# Patient Record
Sex: Female | Born: 1950 | Race: White | Hispanic: No | Marital: Married | Smoking: Current every day smoker
Health system: Southern US, Community
[De-identification: ages and names within clinical notes are randomized; demographics above are authoritative.]

## PROBLEM LIST (undated history)

## (undated) DIAGNOSIS — E785 Hyperlipidemia, unspecified: Secondary | ICD-10-CM

## (undated) DIAGNOSIS — K219 Gastro-esophageal reflux disease without esophagitis: Secondary | ICD-10-CM

## (undated) DIAGNOSIS — I10 Essential (primary) hypertension: Secondary | ICD-10-CM

## (undated) DIAGNOSIS — K635 Polyp of colon: Secondary | ICD-10-CM

## (undated) DIAGNOSIS — R011 Cardiac murmur, unspecified: Secondary | ICD-10-CM

## (undated) DIAGNOSIS — F419 Anxiety disorder, unspecified: Secondary | ICD-10-CM

## (undated) DIAGNOSIS — I2510 Atherosclerotic heart disease of native coronary artery without angina pectoris: Secondary | ICD-10-CM

## (undated) HISTORY — DX: Cardiac murmur, unspecified: R01.1

## (undated) HISTORY — DX: Atherosclerotic heart disease of native coronary artery without angina pectoris: I25.10

## (undated) HISTORY — DX: Anxiety disorder, unspecified: F41.9

## (undated) HISTORY — DX: Gastro-esophageal reflux disease without esophagitis: K21.9

## (undated) HISTORY — DX: Essential (primary) hypertension: I10

## (undated) HISTORY — DX: Polyp of colon: K63.5

## (undated) HISTORY — DX: Hyperlipidemia, unspecified: E78.5

## (undated) HISTORY — PX: TUMOR REMOVAL: SHX12

## (undated) HISTORY — PX: LUMBAR DISC SURGERY: SHX700

## (undated) HISTORY — PX: APPENDECTOMY: SHX54

---

## 1997-08-03 ENCOUNTER — Ambulatory Visit (HOSPITAL_COMMUNITY): Admission: RE | Admit: 1997-08-03 | Discharge: 1997-08-03 | Payer: Self-pay | Admitting: Family Medicine

## 1997-08-10 ENCOUNTER — Ambulatory Visit (HOSPITAL_COMMUNITY): Admission: RE | Admit: 1997-08-10 | Discharge: 1997-08-10 | Payer: Self-pay | Admitting: Family Medicine

## 1997-09-20 ENCOUNTER — Emergency Department (HOSPITAL_COMMUNITY): Admission: EM | Admit: 1997-09-20 | Discharge: 1997-09-20 | Payer: Self-pay | Admitting: Emergency Medicine

## 1998-04-20 ENCOUNTER — Ambulatory Visit (HOSPITAL_COMMUNITY): Admission: RE | Admit: 1998-04-20 | Discharge: 1998-04-20 | Payer: Self-pay | Admitting: Cardiovascular Disease

## 1998-05-30 ENCOUNTER — Encounter: Payer: Self-pay | Admitting: Gastroenterology

## 1998-05-30 ENCOUNTER — Ambulatory Visit (HOSPITAL_COMMUNITY): Admission: RE | Admit: 1998-05-30 | Discharge: 1998-05-30 | Payer: Self-pay | Admitting: Gastroenterology

## 1999-06-05 ENCOUNTER — Encounter: Payer: Self-pay | Admitting: Gastroenterology

## 1999-06-05 ENCOUNTER — Ambulatory Visit (HOSPITAL_COMMUNITY): Admission: RE | Admit: 1999-06-05 | Discharge: 1999-06-05 | Payer: Self-pay | Admitting: Gastroenterology

## 2000-01-09 ENCOUNTER — Encounter (INDEPENDENT_AMBULATORY_CARE_PROVIDER_SITE_OTHER): Payer: Self-pay | Admitting: Specialist

## 2000-01-09 ENCOUNTER — Ambulatory Visit (HOSPITAL_COMMUNITY): Admission: RE | Admit: 2000-01-09 | Discharge: 2000-01-09 | Payer: Self-pay | Admitting: Gastroenterology

## 2000-01-09 ENCOUNTER — Encounter: Payer: Self-pay | Admitting: Gastroenterology

## 2000-01-23 ENCOUNTER — Ambulatory Visit (HOSPITAL_COMMUNITY): Admission: RE | Admit: 2000-01-23 | Discharge: 2000-01-23 | Payer: Self-pay | Admitting: Cardiovascular Disease

## 2000-06-13 ENCOUNTER — Emergency Department (HOSPITAL_COMMUNITY): Admission: EM | Admit: 2000-06-13 | Discharge: 2000-06-13 | Payer: Self-pay

## 2000-12-02 ENCOUNTER — Encounter: Payer: Self-pay | Admitting: Gastroenterology

## 2000-12-02 ENCOUNTER — Ambulatory Visit (HOSPITAL_COMMUNITY): Admission: RE | Admit: 2000-12-02 | Discharge: 2000-12-02 | Payer: Self-pay | Admitting: Gastroenterology

## 2000-12-08 ENCOUNTER — Inpatient Hospital Stay (HOSPITAL_COMMUNITY): Admission: EM | Admit: 2000-12-08 | Discharge: 2000-12-10 | Payer: Self-pay | Admitting: Emergency Medicine

## 2000-12-08 ENCOUNTER — Encounter: Payer: Self-pay | Admitting: Emergency Medicine

## 2000-12-09 ENCOUNTER — Encounter: Payer: Self-pay | Admitting: Cardiology

## 2001-04-11 ENCOUNTER — Encounter: Payer: Self-pay | Admitting: Emergency Medicine

## 2001-04-11 ENCOUNTER — Emergency Department (HOSPITAL_COMMUNITY): Admission: EM | Admit: 2001-04-11 | Discharge: 2001-04-11 | Payer: Self-pay | Admitting: Emergency Medicine

## 2001-06-01 ENCOUNTER — Ambulatory Visit (HOSPITAL_COMMUNITY): Admission: RE | Admit: 2001-06-01 | Discharge: 2001-06-01 | Payer: Self-pay | Admitting: Gastroenterology

## 2001-06-01 ENCOUNTER — Encounter (INDEPENDENT_AMBULATORY_CARE_PROVIDER_SITE_OTHER): Payer: Self-pay | Admitting: Specialist

## 2001-07-20 ENCOUNTER — Encounter: Payer: Self-pay | Admitting: Urology

## 2001-07-23 ENCOUNTER — Observation Stay (HOSPITAL_COMMUNITY): Admission: RE | Admit: 2001-07-23 | Discharge: 2001-07-25 | Payer: Self-pay | Admitting: Urology

## 2001-09-03 ENCOUNTER — Encounter: Admission: RE | Admit: 2001-09-03 | Discharge: 2001-09-03 | Payer: Self-pay | Admitting: Family Medicine

## 2001-09-03 ENCOUNTER — Encounter: Payer: Self-pay | Admitting: Family Medicine

## 2001-09-16 ENCOUNTER — Emergency Department (HOSPITAL_COMMUNITY): Admission: EM | Admit: 2001-09-16 | Discharge: 2001-09-16 | Payer: Self-pay | Admitting: Emergency Medicine

## 2001-09-17 ENCOUNTER — Ambulatory Visit (HOSPITAL_COMMUNITY): Admission: RE | Admit: 2001-09-17 | Discharge: 2001-09-17 | Payer: Self-pay | Admitting: Gastroenterology

## 2001-09-17 ENCOUNTER — Encounter: Payer: Self-pay | Admitting: Gastroenterology

## 2002-01-14 ENCOUNTER — Encounter: Payer: Self-pay | Admitting: Gastroenterology

## 2002-01-14 ENCOUNTER — Ambulatory Visit (HOSPITAL_COMMUNITY): Admission: RE | Admit: 2002-01-14 | Discharge: 2002-01-14 | Payer: Self-pay | Admitting: Gastroenterology

## 2002-09-16 ENCOUNTER — Encounter: Payer: Self-pay | Admitting: Emergency Medicine

## 2002-09-16 ENCOUNTER — Emergency Department (HOSPITAL_COMMUNITY): Admission: EM | Admit: 2002-09-16 | Discharge: 2002-09-17 | Payer: Self-pay | Admitting: Emergency Medicine

## 2002-09-20 ENCOUNTER — Other Ambulatory Visit: Admission: RE | Admit: 2002-09-20 | Discharge: 2002-09-20 | Payer: Self-pay | Admitting: Obstetrics and Gynecology

## 2002-09-21 ENCOUNTER — Ambulatory Visit (HOSPITAL_COMMUNITY): Admission: RE | Admit: 2002-09-21 | Discharge: 2002-09-21 | Payer: Self-pay | Admitting: Obstetrics and Gynecology

## 2002-09-21 ENCOUNTER — Encounter: Payer: Self-pay | Admitting: Obstetrics and Gynecology

## 2002-09-22 ENCOUNTER — Ambulatory Visit: Admission: RE | Admit: 2002-09-22 | Discharge: 2002-09-22 | Payer: Self-pay | Admitting: Gynecology

## 2002-10-05 ENCOUNTER — Inpatient Hospital Stay (HOSPITAL_COMMUNITY): Admission: RE | Admit: 2002-10-05 | Discharge: 2002-10-13 | Payer: Self-pay | Admitting: Obstetrics & Gynecology

## 2002-10-05 ENCOUNTER — Encounter (INDEPENDENT_AMBULATORY_CARE_PROVIDER_SITE_OTHER): Payer: Self-pay

## 2002-10-05 ENCOUNTER — Encounter: Payer: Self-pay | Admitting: Obstetrics & Gynecology

## 2002-10-10 ENCOUNTER — Encounter: Payer: Self-pay | Admitting: Obstetrics & Gynecology

## 2002-10-13 ENCOUNTER — Encounter: Payer: Self-pay | Admitting: Obstetrics & Gynecology

## 2002-10-22 ENCOUNTER — Emergency Department (HOSPITAL_COMMUNITY): Admission: EM | Admit: 2002-10-22 | Discharge: 2002-10-22 | Payer: Self-pay | Admitting: Emergency Medicine

## 2002-10-22 ENCOUNTER — Encounter: Payer: Self-pay | Admitting: Emergency Medicine

## 2002-11-17 ENCOUNTER — Ambulatory Visit: Admission: RE | Admit: 2002-11-17 | Discharge: 2002-11-17 | Payer: Self-pay | Admitting: Gynecology

## 2003-09-25 ENCOUNTER — Emergency Department (HOSPITAL_COMMUNITY): Admission: EM | Admit: 2003-09-25 | Discharge: 2003-09-25 | Payer: Self-pay | Admitting: Emergency Medicine

## 2003-10-19 ENCOUNTER — Emergency Department (HOSPITAL_COMMUNITY): Admission: EM | Admit: 2003-10-19 | Discharge: 2003-10-20 | Payer: Self-pay | Admitting: Emergency Medicine

## 2003-10-28 ENCOUNTER — Encounter: Admission: RE | Admit: 2003-10-28 | Discharge: 2003-10-28 | Payer: Self-pay | Admitting: Family Medicine

## 2004-01-03 ENCOUNTER — Observation Stay (HOSPITAL_COMMUNITY): Admission: RE | Admit: 2004-01-03 | Discharge: 2004-01-04 | Payer: Self-pay | Admitting: Neurosurgery

## 2004-03-01 ENCOUNTER — Encounter: Admission: RE | Admit: 2004-03-01 | Discharge: 2004-03-01 | Payer: Self-pay | Admitting: Neurosurgery

## 2004-05-20 HISTORY — PX: CARDIAC CATHETERIZATION: SHX172

## 2004-06-08 ENCOUNTER — Ambulatory Visit: Payer: Self-pay | Admitting: Internal Medicine

## 2004-06-10 ENCOUNTER — Inpatient Hospital Stay (HOSPITAL_COMMUNITY): Admission: EM | Admit: 2004-06-10 | Discharge: 2004-06-13 | Payer: Self-pay | Admitting: Emergency Medicine

## 2004-06-10 ENCOUNTER — Ambulatory Visit: Payer: Self-pay | Admitting: *Deleted

## 2004-08-02 ENCOUNTER — Ambulatory Visit (HOSPITAL_COMMUNITY): Admission: RE | Admit: 2004-08-02 | Discharge: 2004-08-02 | Payer: Self-pay | Admitting: Gastroenterology

## 2004-08-06 ENCOUNTER — Encounter (INDEPENDENT_AMBULATORY_CARE_PROVIDER_SITE_OTHER): Payer: Self-pay | Admitting: *Deleted

## 2004-08-06 ENCOUNTER — Ambulatory Visit (HOSPITAL_COMMUNITY): Admission: RE | Admit: 2004-08-06 | Discharge: 2004-08-06 | Payer: Self-pay | Admitting: Gastroenterology

## 2007-07-08 ENCOUNTER — Ambulatory Visit: Payer: Self-pay | Admitting: Cardiology

## 2007-07-08 ENCOUNTER — Observation Stay (HOSPITAL_COMMUNITY): Admission: EM | Admit: 2007-07-08 | Discharge: 2007-07-08 | Payer: Self-pay | Admitting: Emergency Medicine

## 2007-07-29 ENCOUNTER — Ambulatory Visit: Payer: Self-pay

## 2007-07-29 ENCOUNTER — Ambulatory Visit: Payer: Self-pay | Admitting: Internal Medicine

## 2007-07-29 LAB — CONVERTED CEMR LAB
BUN: 11 mg/dL (ref 6–23)
CO2: 30 meq/L (ref 19–32)
Chloride: 108 meq/L (ref 96–112)
Creatinine, Ser: 0.6 mg/dL (ref 0.4–1.2)
GFR calc Af Amer: 132 mL/min
Glucose, Bld: 107 mg/dL — ABNORMAL HIGH (ref 70–99)
Potassium: 4.4 meq/L (ref 3.5–5.1)

## 2007-08-13 ENCOUNTER — Ambulatory Visit: Payer: Self-pay | Admitting: Internal Medicine

## 2008-11-09 ENCOUNTER — Telehealth: Payer: Self-pay | Admitting: Internal Medicine

## 2008-11-23 ENCOUNTER — Ambulatory Visit: Payer: Self-pay | Admitting: Internal Medicine

## 2008-11-23 ENCOUNTER — Encounter: Payer: Self-pay | Admitting: Physician Assistant

## 2008-11-23 DIAGNOSIS — R079 Chest pain, unspecified: Secondary | ICD-10-CM

## 2008-11-23 DIAGNOSIS — R0989 Other specified symptoms and signs involving the circulatory and respiratory systems: Secondary | ICD-10-CM

## 2008-11-23 DIAGNOSIS — R011 Cardiac murmur, unspecified: Secondary | ICD-10-CM

## 2008-11-23 DIAGNOSIS — I2510 Atherosclerotic heart disease of native coronary artery without angina pectoris: Secondary | ICD-10-CM | POA: Insufficient documentation

## 2008-12-12 ENCOUNTER — Ambulatory Visit (HOSPITAL_COMMUNITY): Admission: RE | Admit: 2008-12-12 | Discharge: 2008-12-12 | Payer: Self-pay | Admitting: Neurosurgery

## 2008-12-15 ENCOUNTER — Encounter: Payer: Self-pay | Admitting: Internal Medicine

## 2008-12-15 ENCOUNTER — Ambulatory Visit: Payer: Self-pay

## 2009-06-23 ENCOUNTER — Emergency Department (HOSPITAL_COMMUNITY): Admission: EM | Admit: 2009-06-23 | Discharge: 2009-06-23 | Payer: Self-pay | Admitting: Emergency Medicine

## 2009-06-28 ENCOUNTER — Ambulatory Visit: Payer: Self-pay | Admitting: Internal Medicine

## 2009-06-28 DIAGNOSIS — R599 Enlarged lymph nodes, unspecified: Secondary | ICD-10-CM | POA: Insufficient documentation

## 2009-06-28 DIAGNOSIS — J984 Other disorders of lung: Secondary | ICD-10-CM

## 2009-06-28 DIAGNOSIS — R0602 Shortness of breath: Secondary | ICD-10-CM

## 2009-06-28 DIAGNOSIS — R05 Cough: Secondary | ICD-10-CM

## 2009-06-28 DIAGNOSIS — R042 Hemoptysis: Secondary | ICD-10-CM | POA: Insufficient documentation

## 2009-07-03 ENCOUNTER — Ambulatory Visit (HOSPITAL_COMMUNITY): Admission: RE | Admit: 2009-07-03 | Discharge: 2009-07-03 | Payer: Self-pay | Admitting: Internal Medicine

## 2009-07-04 ENCOUNTER — Telehealth: Payer: Self-pay | Admitting: Internal Medicine

## 2009-07-13 ENCOUNTER — Ambulatory Visit: Payer: Self-pay | Admitting: Internal Medicine

## 2009-07-13 DIAGNOSIS — F17200 Nicotine dependence, unspecified, uncomplicated: Secondary | ICD-10-CM

## 2009-07-14 ENCOUNTER — Telehealth (INDEPENDENT_AMBULATORY_CARE_PROVIDER_SITE_OTHER): Payer: Self-pay | Admitting: *Deleted

## 2009-07-17 ENCOUNTER — Telehealth (INDEPENDENT_AMBULATORY_CARE_PROVIDER_SITE_OTHER): Payer: Self-pay | Admitting: *Deleted

## 2009-07-19 ENCOUNTER — Telehealth: Payer: Self-pay | Admitting: Internal Medicine

## 2009-07-21 ENCOUNTER — Ambulatory Visit: Payer: Self-pay | Admitting: Internal Medicine

## 2009-07-21 ENCOUNTER — Ambulatory Visit: Admission: RE | Admit: 2009-07-21 | Discharge: 2009-07-21 | Payer: Self-pay | Admitting: Internal Medicine

## 2009-07-24 ENCOUNTER — Telehealth: Payer: Self-pay | Admitting: Internal Medicine

## 2009-08-25 ENCOUNTER — Ambulatory Visit: Payer: Self-pay | Admitting: Internal Medicine

## 2009-10-26 ENCOUNTER — Ambulatory Visit: Payer: Self-pay | Admitting: Internal Medicine

## 2009-11-08 ENCOUNTER — Telehealth (INDEPENDENT_AMBULATORY_CARE_PROVIDER_SITE_OTHER): Payer: Self-pay | Admitting: *Deleted

## 2009-12-01 ENCOUNTER — Ambulatory Visit: Payer: Self-pay | Admitting: Internal Medicine

## 2009-12-17 ENCOUNTER — Encounter: Admission: RE | Admit: 2009-12-17 | Discharge: 2009-12-17 | Payer: Self-pay | Admitting: Neurosurgery

## 2010-02-26 ENCOUNTER — Telehealth (INDEPENDENT_AMBULATORY_CARE_PROVIDER_SITE_OTHER): Payer: Self-pay | Admitting: *Deleted

## 2010-03-17 ENCOUNTER — Ambulatory Visit: Payer: Self-pay | Admitting: Diagnostic Radiology

## 2010-03-17 ENCOUNTER — Emergency Department (HOSPITAL_BASED_OUTPATIENT_CLINIC_OR_DEPARTMENT_OTHER): Admission: EM | Admit: 2010-03-17 | Discharge: 2010-03-17 | Payer: Self-pay | Admitting: Emergency Medicine

## 2010-03-26 ENCOUNTER — Telehealth (INDEPENDENT_AMBULATORY_CARE_PROVIDER_SITE_OTHER): Payer: Self-pay | Admitting: *Deleted

## 2010-06-19 NOTE — Progress Notes (Signed)
Summary: results  Phone Note Call from Patient   Caller: Patient Call For: Sabien Umland Summary of Call: would like results of biopsy Initial call taken by: Rickard Patience,  July 24, 2009 8:24 AM  Follow-up for Phone Call        advised pt it would take a few days for results to be available. I also asked her how she was feeling and she staets she is feeling good. Her family memebr checked her BP this am and it is still within normal limits. I advised pt we will call her when results are available. Carron Curie CMA  July 24, 2009 9:27 AM

## 2010-06-19 NOTE — Progress Notes (Signed)
  Phone Note Refill Request Message from:  Pharmacy on [address] [facility][phone]  Refills Requested: Medication #1:  COZAAR 25 MG TABS Take 1 tablet by mouth once a day. Initial call taken by: Kandice Hams CMA,  March 26, 2010 5:26 PM    Prescriptions: COZAAR 25 MG TABS (LOSARTAN POTASSIUM) Take 1 tablet by mouth once a day  #30 x 3   Entered by:   Kandice Hams CMA   Authorized by:   Kalman Shan MD   Signed by:   Kandice Hams CMA on 03/26/2010   Method used:   Faxed to ...       [facility] (retail)       [address]       [place], [place]  [address]       Ph: [phone] or [phone]       Fax: [phone]   RxID:   [id number]

## 2010-06-19 NOTE — Assessment & Plan Note (Signed)
Summary: discuss pft results/apc   Visit Type:  Follow-up Copy to:  Er or Urgent care Primary Provider/Referring Provider:  dr Tenny Craw  cardiology, Dr Cheral Bay [facility], Dr Benedetto Goad - Silvestre Gunner PMD  CC:  Pt here to discuss PFT results and PET scan results. .  History of Present Illness: IOV 06/28/2009: 60 year old smoker wiht CAD on aspirin. c/o hemoptysis. Sudden onset on 06/22/2009. At baseline has smoker's dry cough. Prior to onset of hemoptyisis had worsening,  severe, dry cough for 1 week.  She assumed it was due to 'esophagus' (gets periodic annual stretching, dilatation by Dr. Cheral Bay). Cough was associaed with wheezing but it was dry and without fever. Then, had onset of hemoptysis during sleep at 2am. She noticed her gown splattered with blood. Thinks it was large amount based on what she saw on gown.  Went to ER. HAd CT Chest 06/23/2009. Ruled out for PE but showed multiple nodules (See result section) and borderline mediastinal and hilar nodes. Therefore, referred here. Since then no further episodoes of hemoptysis and overall cough intensity has improved significantly. Attributes improvement to cutting down on cigarettes.  Denies associated fever, chills, sputum, weight loss, change in chest pain, change in baseline orthopnea (3 pillow), parosyzmal nocturnal dyspnea, sick contact, or travel.   She presents with daughter Babette Relic and son's girl friend Irving Burton. ER notes reviewed  REC PET sCAN PFTsOV 07/13/2009: Followup hemoptysis, pulmonary nodules, and cough, and dyspnea. No furhter hemoptysis. New issue discussed is smoking. Cut down on smoking to 5 cig/day. Feeling better as a result. Cough improved. Does not want meds to quit. Had PET scan for nodules. Independently reviewed. PET scan is negative. No other new issues.   Current Medications (verified): 1)  Nitroglycerin 0.4 Mg Subl (Nitroglycerin) .... One Tablet Under Tongue Every 5 Minutes As Needed For Chest Pain---May Repeat Times  Three 2)  Aspirin Ec 325 Mg Tbec (Aspirin) .... Once Daily 3)  Lisinopril 20 Mg Tabs (Lisinopril) .... Once Daily 4)  Simvastatin 80 Mg Tabs (Simvastatin) .... Once Daily 5)  Metoprolol Tartrate 50 Mg Tabs (Metoprolol Tartrate) .... Once Daily 6)  Vicodin 5-500 Mg Tabs (Hydrocodone-Acetaminophen) .... As Needed  Allergies (verified): 1)  ! Codeine 2)  ! Morphine  Past History:  Family History: Last updated: 06/28/2009 Hx-negative for CAD In-laws: died of lung cancer and asbestos exposure.   Social History: Last updated: 06/28/2009 Disabled  Alcohol Use - no Patient is a current smoker.  Pt started in 1968 avg 2 ppd. pt states she has decreased to 1 ppd per week.  House wife Married Daughter Tammy - Futures trader at [employer], [employer]. Cell [phone]  Risk Factors: Smoking Status: current (06/28/2009)  Past Medical History: Reviewed history from 06/28/2009 and no changes required. Coronary artery disease, status post bare metal stent to the       proximal LAD in the 1990s.  Last cardiac catheterization in 2006       showed modest in-stent restenosis, approximately 20% in the       proximal LAD, 80% ostial first diagonal lesion that is stable and       not amenable to percutaneous intervention, 20% left circumflex and       20% distal RCA lesions.  Ejection fraction greater than 50% Hypertension Hyperlipidemia Glucose intolerance Colon Polyps.Marland KitchenMarland KitchenDr Bucchini > Annual scopes. All benign per hx. Last scope 2010. Bx 2006 in echar benign polyps.  Gastroesophageal reflux disease  Past Surgical History: Reviewed history from 11/18/2008 and  no changes required. Status post appendectomy Status post removal of benign ovarian tumor Lumbar disk problems, status post back surgery  Family History: Reviewed history from 06/28/2009 and no changes required. Hx-negative for CAD In-laws: died of lung cancer and asbestos exposure.   Social History: Reviewed history from 06/28/2009  and no changes required. Disabled  Alcohol Use - no Patient is a current smoker.  Pt started in 1968 avg 2 ppd. pt states she has decreased to 1 ppd per week.  House wife Married Daughter Tammy - Futures trader at [employer], [employer]. Cell [phone]  Review of Systems  The patient denies shortness of breath with activity, shortness of breath at rest, productive cough, non-productive cough, coughing up blood, chest pain, irregular heartbeats, acid heartburn, indigestion, loss of appetite, weight change, abdominal pain, difficulty swallowing, sore throat, tooth/dental problems, headaches, nasal congestion/difficulty breathing through nose, sneezing, itching, ear ache, anxiety, depression, hand/feet swelling, joint stiffness or pain, rash, change in color of mucus, and fever.    Vital Signs:  Patient profile:   60 year old female Height:      65 inches Weight:      205 pounds O2 Sat:      96 % on Room air Temp:     97.9 degrees F oral Pulse rate:   70 / minute BP sitting:   126 / 70  (right arm) Cuff size:   regular  Vitals Entered By: Carron Curie CMA (July 13, 2009 4:31 PM)  O2 Flow:  Room air  Physical Exam  General:  well developed, well nourished, in no acute distressobese.  did not cough this time. Less tobcco smell Head:  normocephalic and atraumatic Eyes:  PERRLA/EOM intact; conjunctiva and sclera clear Ears:  TMs intact and clear with normal canals Nose:  no deformity, discharge, inflammation, or lesions Mouth:  no deformity or lesions Neck:  no masses, thyromegaly, or abnormal cervical nodes Chest Wall:  no deformities noted Lungs:  decreased BS bilateral and prolonged exhilation.   Heart:  regular rate and rhythm, S1, S2 with murmur but wihtout gallops, or clicks  Abdomen:  bowel sounds positive; abdomen soft and non-tender without masses, or organomegaly Msk:  no deformity or scoliosis noted with normal posture Pulses:  pulses normal Extremities:  no  clubbing, cyanosis, edema, or deformity noted Neurologic:  CN II-XII grossly intact with normal reflexes, coordination, muscle strength and tone Skin:  intact without lesions or rashes Cervical Nodes:  no significant adenopathy Axillary Nodes:  no significant adenopathy Psych:  alert and cooperative; normal mood and affect; normal attention span and concentration   MISC. Report  Procedure date:  06/28/2009  Findings:      PFTs: indepently revieed. Mild mixed obstrn-restn on spiro, reduced DLCO. This is likely due to copd + obesity  MISC. Report  Procedure date:  07/05/2009  Findings:      PET Scan: negative  Impression & Recommendations:  Problem # 1:  LUNG NODULE (ICD-518.89) Assessment Unchanged  CT chest 06/23/2009 (done for hemoptysis) shows 4 x Rt sided nodules (3 x 1cm, 1 x 7mm) and some borderline hilar and mediastinal nodes. Nodules are generally smooth except for RUL nodule wihch is ground glass. Overall, lesions are intermediate-high probabiliyt for lung cancer. Smoker and hemoptysis and size 1cm are risk factors for cnacer. Smooth margins and multiple nodules argue against cancer. PET scan 07/05/2009 is NEGATIVE.  plan serial CT scan - next one is 3rd month in early May 2011  Orders: Radiology Referral (Radiology)  Est. Patient Level V (16109)  Problem # 2:  HEMOPTYSIS UNSPECIFIED (ICD-786.30) Assessment: Improved  No further hemoptysis. CT and PET scan negaive. Still due to age and smoking hx, needs bronc ahd will do BAL at same time from area of nodules. Will set it up for 07/21/2009. Risks of bleeding, ptx, sedation explained. She has had no issues with endoscoy in past. Hold aspirin 3-5 days before bronch  Orders: Est. Patient Level V (60454)  Problem # 3:  COUGH (ICD-786.2) Assessment: Improved  improved after quitting smoking but still +. On lsiinopril  plan change lisinopril to cozaar review symptoms at next visit  Orders: Est. Patient Level V  (09811)  Problem # 4:  TOBACCO ABUSE (ICD-305.1) Assessment: New  Today 07/13/2009 we spent 11 minutes discussing quit smoking. She understands risk of smoking. Has tried zyban in past but made her sick. On patch intermittently. Unwilling to try chantix due to bad press and cost. EXplained how to look at financial impact of quitting smoking. She feels it is in god's hands. She mentioned a woman quit smoking after writing a note to god in church. Thinks she will do the same and by herself. Has already cut down to 5 cig/day. Encouraged to stay motivated. Will monitor  Orders: Est. Patient Level V (91478) Tobacco use cessation intensive >10 minutes (29562)  Problem # 5:  SHORTNESS OF BREATH (SOB) (ICD-786.05) Assessment: Unchanged  PFts show mixed restriction obstruction wiht low dlco. This is likely from obesity and copd. This should explain her dyspnea  plan stop ace inhibitor and see what improvement we get then consider spiriva or symbicprt will ultimately need rehab  Orders: Est. Patient Level V (13086)  Medications Added to Medication List This Visit: 1)  Losartan Potassium 25 Mg Tabs (Losartan potassium) .Marland Kitchen.. 1 tablet daily instead of lsinopril  Patient Instructions: 1)  we will set you up for bronch hopefully 07/21/2009 afternoon 2)  stop aspirin 3-5 days before procedure 3)  quit smoking on own 4)  stop lisinopril - it is causing your cough 5)  take cozaar instead 6)  I will see you in 4 weeks 7)  I am not doing the inhalers because just realized that you are on this medicine that can cause cough and shortness of breath Prescriptions: LOSARTAN POTASSIUM 25 MG TABS (LOSARTAN POTASSIUM) 1 tablet daily instead of lsinopril  #30 x 3   Entered and Authorized by:   Kalman Shan MD   Signed by:   Kalman Shan MD on 07/13/2009   Method used:   Electronically to        [facility] [address] (retail)       [address]       [place], [place]  [address]       Ph:  [phone] or [phone]       Fax: [phone]   RxID:   [id number]

## 2010-06-19 NOTE — Miscellaneous (Signed)
Summary: Orders Update pft charges  Clinical Lists Changes  Orders: Added new Service order of Carbon Monoxide diffusing w/capacity (94720) - Signed Added new Service order of Lung Volumes (94240) - Signed Added new Service order of Spirometry (Pre & Post) (94060) - Signed 

## 2010-06-19 NOTE — Progress Notes (Signed)
Summary: Bronch appt-  Phone Note Call from Patient Call back at Home Phone [phone]   Caller: Patient Call For: ramaswamy Summary of Call: Patient called and would like date fopr Bronch at [facility], she staes she was told it would be on friday 07/21/09. she needs to schedule her transportattion and is therefore looking for a call from Nurse. Dena Initial call taken by: Oneita Jolly,  July 17, 2009 11:10 AM  Follow-up for Phone Call        Please advise if bronch has been scheduled for pt.  Pt needs to arrange transportation. Abigail Miyamoto RN  July 17, 2009 11:17 AM   pt called back again re: date of Bronch. Follow-up by: Eugene Gavia,  July 17, 2009 11:27 AM  Additional Follow-up for Phone Call Additional follow up Details #1::        Jen, pls schedule fro bronch. Airway exam only. Small scope. No TB risk. No fluoro Additional Follow-up by: Kalman Shan MD,  July 17, 2009 12:34 PM    Additional Follow-up for Phone Call Additional follow up Details #2::    spoke to pt again.  TD aware, and told pt would set up and call her by noon on 07/19/2009.  Pt said if not home ok to leave on her answering machine. Follow-up by: Eugene Gavia,  July 18, 2009 2:26 PM  Additional Follow-up for Phone Call Additional follow up Details #3:: Details for Additional Follow-up Action Taken: Spoke with Okey Regal at [facility] this AM around 9am and she wanted to verify that bronch was to be set up for 07/21/09 and they have blocked the appt for 8:15 but they are unable to reach the patient. I verified with Okey Regal what MR wanted per phone note. She had all of this down. She staets she will call back and verify the appt time when she reaches the pt. Carron Curie CMA  July 19, 2009 3:11 PM  I called the the bronch lab and Michelles states pt is scheudled for bronch on 07/21/09 at 8:15. Pt to arrive at 6:45. I have LMTCB to advised pt of the appt and give instructions. Carron Curie  CMA  July 19, 2009 3:13 PM  Pt advised of appt time and date. pt advisd NPO after midnight night before. advised to arrive at 6:45 and that she needs a ride home and someone to stay with her for a little while after. Pt stated understanding. Carron Curie CMA  July 19, 2009 3:20 PM

## 2010-06-19 NOTE — Assessment & Plan Note (Signed)
Summary: hemptysis, lung nodules/apc   Visit Type:  Initial Consult Copy to:  Er or Urgent care Primary Provider/Referring Provider:  dr Tenny Craw  cardiology, Dr Cheral Bay [facility]  CC:  Pulmonary Consult for abnormal CT. Pt c/o hemoptysis that happened on thursday early morning. pt states it was enough blood to saturated the front of her night gown. and her pillow. .  History of Present Illness: IOV 06/28/2009: 60 year old smoker wiht CAD on aspirin. c/o hemoptysis. Sudden onset on 06/22/2009. At baseline has smoker's dry cough. Prior to onset of hemoptyisis had worsening,  severe, dry cough for 1 week.  She assumed it was due to 'esophagus' (gets periodic annual stretching, dilatation by Dr. Cheral Bay). Cough was associaed with wheezing but it was dry and without fever. Then, had onset of hemoptysis during sleep at 2am. She noticed her gown splattered with blood. Thinks it was large amount based on what she saw on gown.  Went to ER. HAd CT Chest 06/23/2009. Ruled out for PE but showed multiple nodules (See result section) and borderline mediastinal and hilar nodes. Therefore, referred here. Since then no further episodoes of hemoptysis and overall cough intensity has improved significantly. Attributes improvement to cutting down on cigarettes.  Denies associated fever, chills, sputum, weight loss, change in chest pain, change in baseline orthopnea (3 pillow), parosyzmal nocturnal dyspnea, sick contact, or travel.   She presents with daughter Babette Relic and son's girl friend Irving Burton. ER notes reviewed  Preventive Screening-Counseling & Management  Alcohol-Tobacco     Smoking Status: current  Current Medications (verified): 1)  Nitroglycerin 0.4 Mg Subl (Nitroglycerin) .... One Tablet Under Tongue Every 5 Minutes As Needed For Chest Pain---May Repeat Times Three 2)  Aspirin Ec 325 Mg Tbec (Aspirin) .... Once Daily 3)  Lisinopril 20 Mg Tabs (Lisinopril) .... Once Daily 4)  Simvastatin 80 Mg Tabs (Simvastatin)  .... Once Daily 5)  Metoprolol Tartrate 50 Mg Tabs (Metoprolol Tartrate) .... Once Daily 6)  Vicodin 5-500 Mg Tabs (Hydrocodone-Acetaminophen) .... As Needed  Allergies (verified): 1)  ! Codeine 2)  ! Morphine  Past History:  Past Surgical History: Last updated: 11/18/2008 Status post appendectomy Status post removal of benign ovarian tumor Lumbar disk problems, status post back surgery  Family History: Last updated: 06/28/2009 Hx-negative for CAD In-laws: died of lung cancer and asbestos exposure.   Social History: Last updated: 06/28/2009 Disabled  Alcohol Use - no Patient is a current smoker.  Pt started in 1968 avg 2 ppd. pt states she has decreased to 1 ppd per week.  House wife Married Daughter Tammy - Futures trader at [employer], [employer]. Cell [phone]  Risk Factors: Smoking Status: current (06/28/2009)  Past Medical History: Coronary artery disease, status post bare metal stent to the       proximal LAD in the 1990s.  Last cardiac catheterization in 2006       showed modest in-stent restenosis, approximately 20% in the       proximal LAD, 80% ostial first diagonal lesion that is stable and       not amenable to percutaneous intervention, 20% left circumflex and       20% distal RCA lesions.  Ejection fraction greater than 50% Hypertension Hyperlipidemia Glucose intolerance Colon Polyps.Marland KitchenMarland KitchenDr Bucchini > Annual scopes. All benign per hx. Last scope 2010. Bx 2006 in echar benign polyps.  Gastroesophageal reflux disease  Family History: Hx-negative for CAD In-laws: died of lung cancer and asbestos exposure.   Social History: Disabled  Alcohol  Use - no Patient is a current smoker.  Pt started in 1968 avg 2 ppd. pt states she has decreased to 1 ppd per week.  House wife Married Daughter Tammy - Futures trader at [employer], [employer]. Cell [phone]  Review of Systems       The patient complains of shortness of breath with activity, productive cough,  coughing up blood, and chest pain.  The patient denies shortness of breath at rest, non-productive cough, irregular heartbeats, acid heartburn, indigestion, loss of appetite, weight change, abdominal pain, difficulty swallowing, sore throat, tooth/dental problems, headaches, nasal congestion/difficulty breathing through nose, sneezing, itching, ear ache, anxiety, depression, hand/feet swelling, joint stiffness or pain, rash, change in color of mucus, and fever.    Vital Signs:  Patient profile:   60 year old female Height:      65 inches Weight:      199.50 pounds O2 Sat:      95 % on Room air Temp:     98.0 degrees F oral Pulse rate:   57 / minute BP sitting:   120 / 76  (right arm) Cuff size:   regular  Vitals Entered By: Carron Curie CMA (June 28, 2009 10:58 AM)  O2 Flow:  Room air CC: Pulmonary Consult for abnormal CT. Pt c/o hemoptysis that happened on thursday early morning. pt states it was enough blood to saturated the front of her night gown. and her pillow.  Comments Medications reviewed with patient Carron Curie CMA  June 28, 2009 11:00 AM Daytime phone number verified with patient.    Physical Exam  General:  well developed, well nourished, in no acute distressobese.  coughs periodicallyu Head:  normocephalic and atraumatic Eyes:  PERRLA/EOM intact; conjunctiva and sclera clear Ears:  TMs intact and clear with normal canals Nose:  no deformity, discharge, inflammation, or lesions Mouth:  no deformity or lesions Neck:  no masses, thyromegaly, or abnormal cervical nodes Chest Wall:  no deformities noted Lungs:  decreased BS bilateral and prolonged exhilation.   Heart:  regular rate and rhythm, S1, S2 with murmur but wihtout gallops, or clicks  Abdomen:  bowel sounds positive; abdomen soft and non-tender without masses, or organomegaly Msk:  no deformity or scoliosis noted with normal posture Pulses:  pulses normal Extremities:  no clubbing, cyanosis,  edema, or deformity noted Neurologic:  CN II-XII grossly intact with normal reflexes, coordination, muscle strength and tone Skin:  intact without lesions or rashes Cervical Nodes:  no significant adenopathy Axillary Nodes:  no significant adenopathy Psych:  anxious.     CT of Chest  Procedure date:  06/23/2009  Findings:       Findings:  Pulmonary arterial opacification is excellent.  There   are no pulmonary emboli.  There is atherosclerosis of the aorta   without aneurysm or dissection.  There is mild thyroid goiter.   There is no pleural or pericardial fluid.    There is an area of ground-glass opacity measuring 10 mm in   diameter within the right upper lobe on image number 35.  There is   a 9 mm pulmonary nodule in the right middle lobe on image 58.   There is an 11 mm nodule the right lower lobe on image number 60.   There is a 7 mm nodule the right lower lobe on image number 66.  No   focal lesion is seen within the left lung.  There is a right hilar   lymph node on  image 50 one measuring 9 mm.  There is a pre carinal   lymph node measuring approximately 11 mm.  There is a left   paratracheal node on image 35 measuring 10 mm.  There is a right   paratracheal node on image number 29 measuring 12 mm.    Scans in the upper abdomen do not show a 13 mm low density at the   dome of the liver likely to be a cyst.    Review of the MIP images confirms the above findings.    IMPRESSION:   No pulmonary emboli or acute aortic pathology.    Ground-glass opacity lesion in the right upper lobe.  Two pulmonary   nodules in the right lower lobe.  One pulmonary nodule in the right   middle lobe.  Borderline hilar and mediastinal lymphadenopathy. If   the patient is at high risk of carcinoma, PET scan would be   suggested.  Otherwise, follow-up CT scan would be suggested in 3   months to assess for stability.    Read By:  Thomasenia Sales,  M.D.   Released By:  Thomasenia Sales,   M.D.  Comments:      reviuewed and agree  Impression & Recommendations:  Problem # 1:  HEMOPTYSIS UNSPECIFIED (ICD-786.30) Assessment New New one episode of hemoptysis in this smoker with undiagnosed COPD. Hemoptysis followed 1 week of worsneing cough that might hvae been mild AE-COPD. However, CT chest 06/23/2009 shows 4 x Rt sided nodules (3 x 1cm, 1 x 7mm) and some borderline hilar and mediastinal nodes. Nodules are generally smooth except for RUL nodule wihch is ground glass. Overall, lesions are intermediate-high probabiliyt for lung cancer. Smoker and hemoptysis and size 1cm are risk factors for cnacer. Smooth margins and multiple nodules argue against cancer.   plan best to proceed with workup and determine etiology Will get PET scan first to understand best bx method I will cal daughter with results - options of bronch/EBUS, and CT guided bx methods explained Orders: Pulmonary Referral (Pulmonary) New Patient Level V (04540) Radiology Referral (Radiology)  Problem # 2:  COUGH (ICD-786.2) Assessment: New  Likely copd  plan full pft  Orders: New Patient Level V (98119)  Patient Instructions: 1)  hvae pet scan 2)  will call you about result and decide if you need to come in for discussion or next biopsy procedure 3)  have full PFT 4)  call us once PFT done to review results   Immunization History:  Influenza Immunization History:    Influenza:  fluvax 3+ (02/17/2009)  Tetanus/Td Immunization History:    Tetanus/Td:  tdap (10/18/2008)   Appended Document: hemptysis, lung nodules/apc PFTs suggest likely mild copd. Pls let them know. needs to see me afer pet scan so I can discuss pet scan resuls and copd Rx with them. Pls give them appt  Appended Document: hemptysis, lung nodules/apc pt scheduled to see MR 07/10/09

## 2010-06-19 NOTE — Progress Notes (Signed)
Summary: Records Request  Faxed OV, Echo & EKG to [facility] at [facility] [phone]). Debby Freiberg  February 26, 2010 4:02 PM

## 2010-06-19 NOTE — Progress Notes (Signed)
Summary: bronch  Phone Note Call from Patient Call back at Home Phone [phone]   Caller: Patient Call For: [name] Summary of Call: Victorino Dike this pt is waiting on a call from you about her Bronch test date, she is very anxious. pt states if she is not home when you call to plz leave a message for her to call you back with a diorect extension per pt.................Marland KitchenDena Initial call taken by: Oneita Jolly,  July 19, 2009 10:43 AM  Follow-up for Phone Call        see prev phone note. pt advise. [name] CMA  July 19, 2009 3:21 PM

## 2010-06-19 NOTE — Assessment & Plan Note (Signed)
Summary: rov 4 wks ///kp   Visit Type:  Follow-up Copy to:  Er or Urgent care Primary Provider/Referring Provider:  dr Tenny Craw  cardiology, Dr Cheral Bay [facility], Dr Benedetto Goad - Silvestre Gunner PMD  CC:  Pt here for follow-up to discuss bronch results. .  History of Present Illness: IOV 06/28/2009: 60 year old smoker wiht CAD on aspirin. c/o hemoptysis. Sudden onset on 06/22/2009. At baseline has smoker's dry cough. Prior to onset of hemoptyisis had worsening,  severe, dry cough for 1 week.  She assumed it was due to 'esophagus' (gets periodic annual stretching, dilatation by Dr. Cheral Bay). Cough was associaed with wheezing but it was dry and without fever. Then, had onset of hemoptysis during sleep at 2am. She noticed her gown splattered with blood. Thinks it was large amount based on what she saw on gown.  Went to ER. HAd CT Chest 06/23/2009. Ruled out for PE but showed multiple nodules (See result section) and borderline mediastinal and hilar nodes. Therefore, referred here. Since then no further episodoes of hemoptysis and overall cough intensity has improved significantly. Attributes improvement to cutting down on cigarettes.  Denies associated fever, chills, sputum, weight loss, change in chest pain, change in baseline orthopnea (3 pillow), parosyzmal nocturnal dyspnea, sick contact, or travel.   She presents with daughter Babette Relic and son's girl friend Irving Burton. ER notes reviewed  REC PET sCAN PFTsOV 07/13/2009: Followup hemoptysis, pulmonary nodules, and cough, and dyspnea. No furhter hemoptysis. New issue discussed is smoking. Cut down on smoking to 5 cig/day. Feeling better as a result. Cough improved. Does not want meds to quit. Had PET scan for nodules. Independently reviewed. PET scan is negative. No other new issues.   Bronch 07/21/2009: Nondiagnostic exam and RML lavage  Ov 08/25/2009: Followup hemoptysis, pulmonary nodules, and cough, and dyspnea. Still withoug hemoptysus. Bronic 07/21/2009 was non  diagnositic. Cough resolved after stopping ace inhbitior and starting cozaar. Denies dyspnea. Still smoking. Refusing chantix (mader her smoke more). Refusing zyban/wellbutrin due to nausea. Will get patch OTC. Smoking half pack per day per grandaughter.   Current Medications (verified): 1)  Nitroglycerin 0.4 Mg Subl (Nitroglycerin) .... One Tablet Under Tongue Every 5 Minutes As Needed For Chest Pain---May Repeat Times Three 2)  Aspirin Ec 325 Mg Tbec (Aspirin) .... Once Daily 3)  Simvastatin 80 Mg Tabs (Simvastatin) .... Once Daily 4)  Metoprolol Tartrate 50 Mg Tabs (Metoprolol Tartrate) .... Once Daily 5)  Vicodin 5-500 Mg Tabs (Hydrocodone-Acetaminophen) .... As Needed 6)  Cozaar 25 Mg Tabs (Losartan Potassium) .... Take 1 Tablet By Mouth Once A Day  Allergies (verified): 1)  ! Codeine 2)  ! Morphine  Past History:  Family History: Last updated: 06/28/2009 Hx-negative for CAD In-laws: died of lung cancer and asbestos exposure.   Social History: Last updated: 06/28/2009 Disabled  Alcohol Use - no Patient is a current smoker.  Pt started in 1968 avg 2 ppd. pt states she has decreased to 1 ppd per week.  House wife Married Daughter Tammy - Futures trader at [employer], [employer]. Cell [phone]  Risk Factors: Smoking Status: current (06/28/2009)  Past Medical History: Reviewed history from 06/28/2009 and no changes required. Coronary artery disease, status post bare metal stent to the       proximal LAD in the 1990s.  Last cardiac catheterization in 2006       showed modest in-stent restenosis, approximately 20% in the       proximal LAD, 80% ostial first diagonal lesion that is  stable and       not amenable to percutaneous intervention, 20% left circumflex and       20% distal RCA lesions.  Ejection fraction greater than 50% Hypertension Hyperlipidemia Glucose intolerance Colon Polyps.Marland KitchenMarland KitchenDr Bucchini > Annual scopes. All benign per hx. Last scope 2010. Bx 2006 in echar  benign polyps.  Gastroesophageal reflux disease  Past Surgical History: Reviewed history from 11/18/2008 and no changes required. Status post appendectomy Status post removal of benign ovarian tumor Lumbar disk problems, status post back surgery  Family History: Reviewed history from 06/28/2009 and no changes required. Hx-negative for CAD In-laws: died of lung cancer and asbestos exposure.   Social History: Reviewed history from 06/28/2009 and no changes required. Disabled  Alcohol Use - no Patient is a current smoker.  Pt started in 1968 avg 2 ppd. pt states she has decreased to 1 ppd per week.  House wife Married Daughter Tammy - Futures trader at [employer], clinical. Cell [phone]  Review of Systems  The patient denies shortness of breath with activity, shortness of breath at rest, productive cough, non-productive cough, coughing up blood, chest pain, irregular heartbeats, acid heartburn, indigestion, loss of appetite, weight change, abdominal pain, difficulty swallowing, sore throat, tooth/dental problems, headaches, nasal congestion/difficulty breathing through nose, sneezing, itching, ear ache, anxiety, depression, hand/feet swelling, joint stiffness or pain, rash, change in color of mucus, and fever.    Vital Signs:  Patient profile:   60 year old female Height:      65 inches Weight:      200.38 pounds BMI:     33.47 O2 Sat:      96 % on Room air Temp:     97.7 degrees F oral Pulse rate:   61 / minute BP sitting:   140 / 86  (right arm) Cuff size:   regular  Vitals Entered By: Carron Curie CMA (August 25, 2009 10:17 AM)  O2 Flow:  Room air CC: Pt here for follow-up to discuss bronch results.  Comments Medications reviewed with patient Carron Curie CMA  August 25, 2009 10:20 AM Daytime phone number verified with patient.    Physical Exam  General:  well developed, well nourished, in no acute distressobese.  did not cough this time. Less tobcco  smell Head:  normocephalic and atraumatic Eyes:  PERRLA/EOM intact; conjunctiva and sclera clear Ears:  TMs intact and clear with normal canals Nose:  no deformity, discharge, inflammation, or lesions Mouth:  no deformity or lesions Neck:  no masses, thyromegaly, or abnormal cervical nodes Chest Wall:  no deformities noted Lungs:  decreased BS bilateral and prolonged exhilation.   Heart:  regular rate and rhythm, S1, S2 with murmur but wihtout gallops, or clicks  Abdomen:  bowel sounds positive; abdomen soft and non-tender without masses, or organomegaly Msk:  no deformity or scoliosis noted with normal posture Pulses:  pulses normal Extremities:  no clubbing, cyanosis, edema, or deformity noted Neurologic:  CN II-XII grossly intact with normal reflexes, coordination, muscle strength and tone Skin:  intact without lesions or rashes Cervical Nodes:  no significant adenopathy Axillary Nodes:  no significant adenopathy Psych:  alert and cooperative; normal mood and affect; normal attention span and concentration   Impression & Recommendations:  Problem # 1:  TOBACCO ABUSE (ICD-305.1) Assessment Improved STill smoking. REfusing chantix and zyban. SHe will try patch otc. BUt ref[facility]ses at [facility].   5 minute face to face counselling Orders: Radiology Referral (Radiology) Est. Patient Level III (  16109) Tobacco use cessation intermediate 3-10 minutes (99406)  Problem # 2:  SHORTNESS OF BREATH (SOB) (ICD-786.05) Assessment: Improved  PFts show mixed restriction obstruction wiht low dlco. This is likely from obesity and copd. This should explain her dyspnea. On 08/25/2009 denies dyspnea and feels this has improved after stoopping ACE inhibitor. However exam suggests she might have COPD  plan repeat spiro at followup  Orders: Est. Patient Level III (60454)  Problem # 3:  COUGH (ICD-786.2) Assessment: Improved  REsolvd after stopping ACE INHBIITOR  plan continue  cozaar  Orders: Est. Patient Level III (09811)  Problem # 4:  LUNG NODULE (ICD-518.89) Assessment: Unchanged  Orders: Radiology Referral (Radiology) Est. Patient Level III (91478)  CT chest 06/23/2009 (done for hemoptysis) shows 4 x Rt sided nodules (3 x 1cm, 1 x 7mm) and some borderline hilar and mediastinal nodes. Nodules are generally smooth except for RUL nodule wihch is ground glass. Overall, lesions are intermediate-high probabiliyt for lung cancer. Smoker and hemoptysis and size 1cm are risk factors for cnacer. Smooth margins and multiple nodules argue against cancer. PET scan 07/05/2009 is NEGATIVE. [facility] MARCH 2011 with RML LAVAGE was normal  plan serial CT scan - next one is 4th month in June 2011  Orders: Radiology Referral (Radiology)  Problem # 5:  HEMOPTYSIS UNSPECIFIED (ICD-786.30) Assessment: Improved  No further hemoptysis since Feb 2011. CT and PET scan negaive. Still due to age and smoking hx, needs bronc ahd will do BAL at same time from area of nodules. Broch 07/21/2009 with RML Lavage - normal airways and nondiganostic lavage  Medications Added to Medication List This Visit: 1)  Cozaar 25 Mg Tabs (Losartan potassium) .... Take 1 tablet by mouth once a day  Patient Instructions: 1)  have CT chest in June 2011 for nodule followup 2)  get nictoine patch from pharmacy and work on quitting smoking 3)  at followup will do spirometry 4)  return to see me after CT chest

## 2010-06-19 NOTE — Progress Notes (Signed)
Summary: results  Phone Note Call from Patient Call back at Home Phone [phone]   Caller: Patient Call For: Worth Kober Summary of Call: calling for pet scan results Initial call taken by: Rickard Patience,  July 04, 2009 4:31 PM  Follow-up for Phone Call        please advise. Carron Curie CMA  July 04, 2009 4:45 PM  pt anxiously waiting for results of PET scan.  Please call her at home number after 12:00 noon.  Follow-up by: Eugene Gavia,  July 05, 2009 8:36 AM  Additional Follow-up for Phone Call Additional follow up Details #1::        the second day she hasnt heard anything. she wants her results [phone] home. if you cant get her there then call  [phone] tammy whitehead Additional Follow-up by: Valinda Hoar,  July 06, 2009 12:42 PM    Additional Follow-up for Phone Call Additional follow up Details #2::    done. see pet scan notes. Pls ensure fu appt. I believe they come next week thruday Follow-up by: Kalman Shan MD,  July 06, 2009 3:06 PM  Additional Follow-up for Phone Call Additional follow up Details #3:: Details for Additional Follow-up Action Taken: pt scheduled to see MR on 07/13/09. pt aware.Carron Curie CMA  July 06, 2009 3:07 PM

## 2010-06-19 NOTE — Progress Notes (Signed)
Summary: Broncg date  Phone Note Call from Patient   Caller: Patient Call For: [name]/nurse Reason for Call: Lab or Test Results Summary of Call: pt is concerned about her test date for friday 07/21/09 for her Bronch she is waiting for nurse of RAmaswamy to call.in order that she may set up her ride.  [name]  July 17, 2009 11:01 AM   Initial call taken by: Oneita Jolly,  July 17, 2009 11:01 AM

## 2010-06-19 NOTE — Progress Notes (Signed)
Summary: talk to nurse  Phone Note Call from Patient Call back at [phone]   Caller: Daughter//[name] Call For: ramaswamy Summary of Call: States her mom has appt on 7/6 to discuss ct results, and wants to know if it is necessary for her to come with her, pls advise. Initial call taken by: Darletta Moll,  November 08, 2009 3:28 PM  Follow-up for Phone Call        called spoke with patient's daughter [name] and informed her that per MR's append of CT that there is overall good news, but that we prefer to bring pt's in for further review in case of questions, etc.  [name] verbalized her understanding.  also advised [name] that if she decides not to come with her mother to the appt then she can call our office for details on the ov (party release form signed by pt 06/2009).  [name] verbalized her understanding. Follow-up by: Boone Master CNA/MA,  November 08, 2009 3:38 PM

## 2010-06-19 NOTE — Assessment & Plan Note (Signed)
Summary: discuss CT results / cj   Visit Type:  Follow-up Copy to:  Er or Urgent care Primary Provider/Referring Provider:  dr Tenny Craw  cardiology, Dr Cheral Bay [facility], Dr Benedetto Goad - Silvestre Gunner PMD  CC:  follow up today--discuss ct scan results.  History of Present Illness: : FU smoking, poulmonary nodules (multiple intermed prob nodules in feb 2011), dyspnea due to obesity and milld copd. s/p cough due to ace inhibitor and s/p hemoptysis in Feb 2011 with negative bronch   OV 12/01/2009: No further hemoptysis. Denies dyspnea, cough, wheeze, chest pain, orthopnea, paroxysmal nocturnal dyspnea, fever, chills. Here to review CT results from June 2011 (4th months can). This shows continued improvement. STill smoking but is vague about how many she is smoking other than "I have cut down to few cigs per day".  Preventive Screening-Counseling & Management  Alcohol-Tobacco     Smoking Status: current     Smoking Cessation Counseling: yes     Smoke Cessation Stage: contemplative     Packs/Day: 1/2 pack per week     Year Started: 1972     Tobacco Counseling: to quit use of tobacco products  Current Medications (verified): 1)  Nitroglycerin 0.4 Mg Subl (Nitroglycerin) .... One Tablet Under Tongue Every 5 Minutes As Needed For Chest Pain---May Repeat Times Three 2)  Aspirin Ec 325 Mg Tbec (Aspirin) .... Once Daily 3)  Simvastatin 80 Mg Tabs (Simvastatin) .... Once Daily 4)  Metoprolol Tartrate 50 Mg Tabs (Metoprolol Tartrate) .... Once Daily 5)  Vicodin 5-500 Mg Tabs (Hydrocodone-Acetaminophen) .... As Needed 6)  Cozaar 25 Mg Tabs (Losartan Potassium) .... Take 1 Tablet By Mouth Once A Day  Allergies: 1)  ! Codeine 2)  ! Morphine  Comments:  Nurse/Medical Assistant: The patient's medications and allergies were reviewed with the patient and were updated in the Medication and Allergy Lists.  Past History:  Family History: Last updated: 06/28/2009 Hx-negative for CAD In-laws: died  of lung cancer and asbestos exposure.   Social History: Last updated: 06/28/2009 Disabled  Alcohol Use - no Patient is a current smoker.  Pt started in 1968 avg 2 ppd. pt states she has decreased to 1 ppd per week.  House wife Married Daughter Tammy - Futures trader at [employer], clinical. Cell [phone]  Risk Factors: Smoking Status: current (12/01/2009) Packs/Day: 1/2 pack per week (12/01/2009)  Past Medical History: Reviewed history from 06/28/2009 and no changes required. Coronary artery disease, status post bare metal stent to the       proximal LAD in the 1990s.  Last cardiac catheterization in 2006       showed modest in-stent restenosis, approximately 20% in the       proximal LAD, 80% ostial first diagonal lesion that is stable and       not amenable to percutaneous intervention, 20% left circumflex and       20% distal RCA lesions.  Ejection fraction greater than 50% Hypertension Hyperlipidemia Glucose intolerance Colon Polyps.Marland KitchenMarland KitchenDr Bucchini > Annual scopes. All benign per hx. Last scope 2010. Bx 2006 in echar benign polyps.  Gastroesophageal reflux disease  Past Surgical History: Reviewed history from 11/18/2008 and no changes required. Status post appendectomy Status post removal of benign ovarian tumor Lumbar disk problems, status post back surgery  Family History: Reviewed history from 06/28/2009 and no changes required. Hx-negative for CAD In-laws: died of lung cancer and asbestos exposure.   Social History: Reviewed history from 06/28/2009 and no changes required. Disabled  Alcohol Use -  no Patient is a current smoker.  Pt started in 1968 avg 2 ppd. pt states she has decreased to 1 ppd per week.  House wife Married Daughter Tammy - Futures trader at [employer], clinical. Cell [phone] Packs/Day:  1/2 pack per week  Review of Systems  The patient denies shortness of breath with activity, shortness of breath at rest, productive cough, non-productive  cough, coughing up blood, chest pain, irregular heartbeats, acid heartburn, indigestion, loss of appetite, weight change, abdominal pain, difficulty swallowing, sore throat, tooth/dental problems, headaches, nasal congestion/difficulty breathing through nose, sneezing, itching, ear ache, anxiety, depression, hand/feet swelling, joint stiffness or pain, rash, change in color of mucus, and fever.         pt c/o of left lower back pain   Vital Signs:  Patient profile:   60 year old female Height:      65 inches Weight:      194 pounds BMI:     32.40 O2 Sat:      96 % on Room air Temp:     97.3 degrees F oral Pulse rate:   86 / minute BP sitting:   128 / 88  (right arm) Cuff size:   regular  Vitals Entered By: Randell Loop CMA (December 01, 2009 1:26 PM)  O2 Sat at Rest %:  96 O2 Flow:  Room air CC: follow up today--discuss ct scan results Is Patient Diabetic? No Pain Assessment Patient in pain? yes      Onset of pain  right lower back pain Comments meds and allergies reviewed with pt today daytime phone number reviewed with pt Randell Loop CMA  December 01, 2009 1:35 PM    Physical Exam  General:  well developed, well nourished, in no acute distressobese.  did not cough this time. Less tobcco smell Head:  normocephalic and atraumatic Eyes:  PERRLA/EOM intact; conjunctiva and sclera clear Ears:  TMs intact and clear with normal canals Nose:  no deformity, discharge, inflammation, or lesions Mouth:  no deformity or lesions Neck:  no masses, thyromegaly, or abnormal cervical nodes Chest Wall:  no deformities noted Lungs:  decreased BS bilateral and prolonged exhilation.   Heart:  regular rate and rhythm, S1, S2 with murmur but wihtout gallops, or clicks  Abdomen:  bowel sounds positive; abdomen soft and non-tender without masses, or organomegaly Msk:  no deformity or scoliosis noted with normal posture Pulses:  pulses normal Extremities:  no clubbing, cyanosis, edema, or deformity  noted Neurologic:  CN II-XII grossly intact with normal reflexes, coordination, muscle strength and tone Skin:  intact without lesions or rashes Cervical Nodes:  no significant adenopathy Axillary Nodes:  no significant adenopathy Psych:  alert and cooperative; normal mood and affect; normal attention span and concentration   EKG  Procedure date:  10/29/2009  Findings:      Comparison: 06/23/2009   Findings: Heart is normal size. Aorta is normal caliber. Dense coronary artery calcifications are present.   The ground-glass density in the right upper lobe has improved since prior study, now difficult to measure but approximately 6 mm compared to 10 mm previously.  Right middle lobe nodule measures 8 mm compared to 9 mm previously.  The anterior right lower lobe nodule (image 35) measures 10 mm compared to 11 mm previously. Medial right lower lobe nodule measures 7 mm compared to 7 mm previously.   No new pulmonary nodules.  Mediastinal lymph nodes are less prominent with none pathologically enlarged currently.  Normal axillary lymph  nodes are noted.   Chest wall soft tissues are unremarkable. Imaging into the upper abdomen shows no acute findings.   High resolution images through the chest suggests some mild central peribronchial thickening, but no other significant interstitial lung disease.  Expiration views demonstrate mosaic perfusion throughout the lungs suggesting some areas of air trapping.   IMPRESSION: The right upper lobe ground-glass nodule has improved since prior study.  Solid right middle and right lower lobe nodules are stable. Recommend continued surveillance with next study in 6-12 months.   Mild central peribronchial thickening with areas of air trapping on expiration imaging.   Read By:  Charlett Nose,  M.D.     Released By:  Charlett Nose,  M.D.   Comments:      independently reviewed  Impression & Recommendations:  Problem # 1:  TOBACCO ABUSE  (ICD-305.1) Assessment Unchanged  counselled  STill smoking. Again REfusing chantix and zyban. Not trying patch either Again refusd classses at [facility]. She will quit on own. Advised her to set stop date  5 minute face to face counselling Orders: Radiology Referral (Radiology)  Orders: Est. Patient Level III (16109) Tobacco use cessation intermediate 3-10 minutes (60454)  Problem # 2:  SHORTNESS OF BREATH (SOB) (ICD-786.05) Assessment: Improved  resovled  Orders: Est. Patient Level III (09811)  Problem # 3:  LUNG NODULE (ICD-518.89) Assessment: Improved  Orders: Radiology Referral (Radiology) Est. Patient Level III (91478)  Orders: Radiology Referral (Radiology)  CT chest 06/23/2009 (done for hemoptysis) shows 4 x Rt sided nodules (3 x 1cm, 1 x 7mm) and some borderline hilar and mediastinal nodes. Nodules are generally smooth except for RUL nodule wihch is ground glass. Overall, lesions are intermediate-high probabiliyt for lung cancer. Smoker and hemoptysis and size 1cm are risk factors for cnacer. Smooth margins and multiple nodules argue against cancer. PET scan 07/05/2009 is NEGATIVE. [facility] MARCH 2011 with RML LAVAGE was normal  > CT scan6/03/2010: Improved  PLAN NExt scan in 9-10 months  Problem # 4:  HEMOPTYSIS UNSPECIFIED (ICD-786.30) Assessment: Comment Only  No further hemoptysis since Feb 2011. CT and PET scan negaive. S Broch 07/21/2009 with RML Lavage - normal airways and nondiganostic lavage.   plan expectant approach  Orders: Est. Patient Level III (29562)  Problem # 5:  ENLARGEMENT OF LYMPH NODES (ICD-785.6)  Orders: Est. Patient Level III (13086)  Patient Instructions: 1)  please have CT scan in 10 months and return for followup 2)  please quit smoking 3)  return sooner ifany new problems

## 2010-06-19 NOTE — Progress Notes (Signed)
Summary: returned call   Phone Note Call from Patient Call back at Home Phone [phone]   Caller: Patient Call For: ramaswamy Summary of Call: pt returned call. (says someone called moments ago- didn't know who).  Initial call taken by: Jasmine Ringer, CNA,  July 14, 2009 12:37 PM  Follow-up for Phone Call        Did you call pt [name]? Please advise thanks [name]  July 14, 2009 3:23 PM  no it was [facility].[name] CMA  July 14, 2009 3:27 PM   Additional Follow-up for Phone Call Additional follow up Details #1::        left pt a message to call us back on monday 07/17/09 Additional Follow-up by: [name],  July 14, 2009 4:59 PM

## 2010-08-01 LAB — DIFFERENTIAL
Basophils Absolute: 0.1 10*3/uL (ref 0.0–0.1)
Eosinophils Absolute: 0 10*3/uL (ref 0.0–0.7)
Eosinophils Relative: 1 % (ref 0–5)
Lymphocytes Relative: 31 % (ref 12–46)
Lymphs Abs: 2.4 10*3/uL (ref 0.7–4.0)
Monocytes Absolute: 1.2 10*3/uL — ABNORMAL HIGH (ref 0.1–1.0)
Monocytes Relative: 15 % — ABNORMAL HIGH (ref 3–12)
Neutro Abs: 4.2 10*3/uL (ref 1.7–7.7)
Neutrophils Relative %: 52 % (ref 43–77)

## 2010-08-01 LAB — POCT CARDIAC MARKERS
CKMB, poc: <1 ng/mL — ABNORMAL LOW (ref 1.0–8.0)
Myoglobin, poc: 24.4 ng/mL (ref 12–200)
Troponin i, poc: <0.05 ng/mL (ref 0.00–0.09)

## 2010-08-01 LAB — CBC
HCT: 43.5 % (ref 36.0–46.0)
MCV: 90.8 fL (ref 78.0–100.0)
Platelets: 172 10*3/uL (ref 150–400)
RDW: 13.4 % (ref 11.5–15.5)
WBC: 7.9 10*3/uL (ref 4.0–10.5)

## 2010-08-01 LAB — BASIC METABOLIC PANEL
Calcium: 8.7 mg/dL (ref 8.4–10.5)
Creatinine, Ser: 0.6 mg/dL (ref 0.4–1.2)
GFR calc Af Amer: >60 mL/min (ref >60)

## 2010-08-08 LAB — CBC
HCT: 44.4 % (ref 36.0–46.0)
Hemoglobin: 15.2 g/dL — ABNORMAL HIGH (ref 12.0–15.0)
Platelets: 224 10*3/uL (ref 150–400)
RBC: 4.71 MIL/uL (ref 3.87–5.11)
WBC: 10.3 10*3/uL (ref 4.0–10.5)

## 2010-08-08 LAB — DIFFERENTIAL
Basophils Relative: 0 % (ref 0–1)
Eosinophils Absolute: 0.2 10*3/uL (ref 0.0–0.7)
Lymphocytes Relative: 30 % (ref 12–46)
Lymphs Abs: 3.1 10*3/uL (ref 0.7–4.0)
Monocytes Absolute: 1.1 10*3/uL — ABNORMAL HIGH (ref 0.1–1.0)
Monocytes Relative: 10 % (ref 3–12)
Neutrophils Relative %: 57 % (ref 43–77)

## 2010-08-08 LAB — BASIC METABOLIC PANEL
Chloride: 106 mEq/L (ref 96–112)
Creatinine, Ser: 0.49 mg/dL (ref 0.4–1.2)
GFR calc non Af Amer: >60 mL/min (ref >60)
Sodium: 139 mEq/L (ref 135–145)

## 2010-08-10 LAB — GLUCOSE, CAPILLARY: Glucose-Capillary: 136 mg/dL — ABNORMAL HIGH (ref 70–99)

## 2010-08-13 LAB — LEGIONELLA PROFILE(CULTURE+DFA/SMEAR): Special Requests: ABNORMAL

## 2010-08-13 LAB — BODY FLUID CELL COUNT WITH DIFFERENTIAL
Eos, Fluid: 1 %
Monocyte-Macrophage-Serous Fluid: 21 % — ABNORMAL LOW (ref 50–90)
Neutrophil Count, Fluid: 70 % — ABNORMAL HIGH (ref 0–25)
Total Nucleated Cell Count, Fluid: 320 cu mm (ref 0–1000)

## 2010-08-13 LAB — CULTURE, RESPIRATORY W GRAM STAIN

## 2010-08-13 LAB — FUNGUS CULTURE W SMEAR
Fungal Smear: NONE SEEN
Special Requests: ABNORMAL

## 2010-08-13 LAB — AFB CULTURE WITH SMEAR (NOT AT ARMC): Special Requests: ABNORMAL

## 2010-08-26 LAB — CREATININE, SERUM
Creatinine, Ser: 0.65 mg/dL (ref 0.4–1.2)
GFR calc non Af Amer: >60 mL/min (ref >60)

## 2010-09-05 ENCOUNTER — Other Ambulatory Visit: Payer: Self-pay | Admitting: Internal Medicine

## 2010-09-05 DIAGNOSIS — J984 Other disorders of lung: Secondary | ICD-10-CM

## 2010-09-05 DIAGNOSIS — R911 Solitary pulmonary nodule: Secondary | ICD-10-CM

## 2010-10-02 NOTE — Assessment & Plan Note (Signed)
[facility]                            CARDIOLOGY OFFICE NOTE   [name], [name]                   MRN:          [id number]  DATE:08/13/2007                            DOB:          1950-09-18    IDENTIFICATION:  [name] is a 60 year old woman who I saw remotely  in January 2006.  She has a history of CAD with an 80% diagonal which is  a smaller vessel, some calcification of the LAD and circumflex.   Since seen, she was actually admitted to [facility] with chest pain  in February.  Refer to the admission for full details.  She underwent a  Myoview scan that showed normal perfusion.  Note, her blood pressure was  high when she went into the hospital.  Question if this contributed to  some of her symptoms.  Since being discharged, she has not had any chest  pain.  She seems to be feeling okay.   CURRENT MEDICATIONS:  1. Aspirin 325.  2. Lisinopril 20.  3. Simvastatin 80.  4. Metoprolol 50.   PHYSICAL EXAMINATION:  GENERAL:  The patient is in no distress.  VITAL SIGNS:  Blood pressure is 120/60, pulse is 56 and regular, weight  213 which is down from 222 in January 2006.  LUNGS:  Clear.  CARDIAC:  Regular rate and rhythm, S1-S2, no murmurs.  ABDOMEN:  Benign.  EXTREMITIES:  No edema.   LABORATORY DATA:  At the hospital:  Total cholesterol 123, HDL 22, LDL  68, triglycerides of 401.   DIAGNOSTICS:  A 12-lead EKG:  Sinus rhythm, 62 beats per minute,  relatively low voltage.   IMPRESSION:  1. Coronary artery disease, small diagonal with disease by cath a few      years ago.  Recent Myoview scan shows normal perfusion.  Her chest pain does not  appear to be related to ischemia.  She is asymptomatic now.  Would  manage risk factors.  1. Dyslipidemia.  We will need to increase treating this.  As her HDL      is low, we will add Niaspan to her regimen and follow in 10-12      weeks' time.  2. Hypertension, good control.   FOLLOW UP:  I will set followup for the winter, but will be in touch  with her regarding her blood work.     Pricilla Riffle, MD, [facility]  Electronically Signed    PVR/MedQ  DD: 08/13/2007  DT: 08/14/2007  Job #: [id number]

## 2010-10-02 NOTE — Discharge Summary (Signed)
[name], [name]            ACCOUNT NO.:  [id number]   MEDICAL RECORD NO.:  [id number]          PATIENT TYPE:  INP   LOCATION:  6524                         FACILITY:  [facility]   PHYSICIAN:  Madolyn Frieze. Jens Som, MD, FACCDATE OF BIRTH:  07-24-1950   DATE OF ADMISSION:  07/07/2007  DATE OF DISCHARGE:  07/08/2007                               DISCHARGE SUMMARY   PRIMARY CARDIOLOGIST:  Pricilla Riffle, MD, [facility]   PRIMARY CARE PHYSICIAN:  Dr. Andrey Campanile.   PROCEDURES PERFORMED DURING HOSPITALIZATION:  None.   FINAL DISCHARGE DIAGNOSES:  1. Hypertension.  2. Chest pain.  3. Hypercholesterolemia.  4. History of coronary artery disease.      a.     Status post bare metal stent to the left anterior descending       with cath in January of 2006 revealing 20% in-stent stenosis, 80%       ostial diagonal I small, 20% left circumflex and 20% distal right       coronary.  5. Gastroesophageal reflux disease.  6. Glucose intolerance.   HOSPITAL COURSE:  This 60 year old female with a history of known  coronary artery disease started having a weird feeling in her chest  like electricity around 5:30 p.m., and soon followed by left arm pain  that lasted approximately 90 minutes.  She checked her blood pressure at  home.  It was reportedly greater than 200.  She called EMS, and her  blood pressure was found to be elevated at 198/70 on their check.  The  patient did not take any nitroglycerin, her blood pressure came down on  its own, and the arm and chest pain resolved on their own.  Secondary to  symptoms, the patient was seen in the emergency room.   The patient was admitted for rule out myocardial infarction in the  setting of known coronary artery disease with marked hypertension.  The  patient's lisinopril was increased to 20 mg from 10 mg p.o. daily.  Her  cardiac enzymes were found to be negative.  Chest pain resolved without  any further symptoms.  The patient was seen and examined by Dr.  Olga Millers on July 08, 2007 and found to be stable for discharge with  a blood pressure of 131/48.  The patient will have a follow up BMET and  stress Myoview and then follow up with Dr. Dietrich Pates.  Her lisinopril  was increased to 20 mg from 10 mg, with a BMET to be completed within 1  week.   DISCHARGE LABORATORIES:  Sodium 141, potassium 4.4, chloride 106, CO2 of  28, BUN 12, creatinine 0.6, glucose 109.  Hemoglobin 15.3, hematocrit  45, white blood cells 11.0, platelets 228.  TSH 4.171, troponin 0.01 and  0.02 respectively.  Cholesterol 123, triglycerides 163, HDL of 22, LDL  of 68.  D-dimer less than 0.22.  BNP 50.0.  EKG revealing sinus  bradycardia, ventricular rate of 54 beats per minute with nonspecific  anterior T wave abnormality.   DISCHARGE MEDICATIONS:  1. Lisinopril 20 mg daily (increased dose from 10 mg daily).  2. Toprol-XL 50  mg daily.  3. Zocor 80 mg daily.  4. Valium 10 mg twice a day p.r.n.  5. Lortab 5/325 mg every 6 hours as needed.  6. Nitroglycerin 0.4 mg p.r.n.   ALLERGIES:  1. CODEINE.  2. MORPHINE.   FOLLOW UP PLANS AND APPOINTMENTS:  1. The patient is scheduled for a stress Myoview on July 20, 2007 at 9      a.m.  2. The patient will have a BMET drawn at the same time to evaluate      renal function with increased dose of lisinopril.  3. The patient will follow with Dr. Dietrich Pates on August 13, 2007 at      2.45 for continued cardiac management and evaluation of blood      pressure.  4. The patient is to follow up with her primary care physician, Dr.      Andrey Campanile, for continued medical management.   Time spent with the patient to include physician time 30 minutes.      Bettey Mare. Lyman Bishop, NP      Madolyn Frieze. Jens Som, MD, [facility]  Electronically Signed    KML/MEDQ  D:  07/08/2007  T:  07/09/2007  Job:  [id number]   cc:   Dr. Andrey Campanile

## 2010-10-02 NOTE — H&P (Signed)
NAMEMARRIAH, SANDERLIN NO.:  [id number]   MEDICAL RECORD NO.:  [id number]          PATIENT TYPE:  EMS   LOCATION:  MAJO                         FACILITY:  [facility]   PHYSICIAN:  Lowella Bandy, MD      DATE OF BIRTH:  09-Aug-1950   DATE OF ADMISSION:  07/07/2007  DATE OF DISCHARGE:                              HISTORY & PHYSICAL   CHIEF COMPLAINT:  Chest pain.   HISTORY OF PRESENT ILLNESS:  [name] is a 60 year old female with  a history of coronary artery disease and previous stenting of the LAD  who started having weird feelings n her chest, like electricity  shooting through my chest  at approximately 5:30 p.m.  This was soon  followed by pain in her left shoulder and left arm. She reports that  these symptoms lasted over 90 minutes and she checked her blood pressure  at home with a monitor and the systolic blood pressure was reportedly  greater than 200.  She did not take a nitroglycerin at that time.  Her  family called EMS and on EMS arrival, her blood pressure was 198/70.  Her symptoms gradually resolved on their own. She was given 325 mg of  aspirin by EMS and brought to the [facility]  Emergency  Department.  She has remained chest pain free since her arrival here.  She denies any recent history of chest pain or other exertional  symptoms.  She had a recent febrile illness several days ago with fever,  runny nose, and myalgias but this has resolved.   PAST MEDICAL HISTORY:  1. Coronary artery disease, status post bare metal stent to the      proximal LAD in the 1990s.  Last cardiac catheterization in 2006      showed modest in-stent restenosis, approximately 20% in the      proximal LAD, 80% ostial first diagonal lesion that is stable and      not amenable to percutaneous intervention, 20% left circumflex and      20% distal RCA lesions.  Ejection fraction greater than 50%.  2. Hypertension.  3. Hyperlipidemia.  4. Glucose intolerance.  5.  Lumbar disk problems, status post back surgery.  6. Gastroesophageal reflux disease.  7. Esophageal ring.  8. Colon polyps.  9. Status post appendectomy.  10.Status post removal of benign ovarian tumor.   MEDICATIONS:  1. Enteric-coated aspirin 325 mg p.o. once a day.  2. Toprol-XL 50 mg p.o. once a day.  3. Zocor 80 mg p.o. once a day.  4. Lisinopril 10 mg p.o. once a day.  5. Nitroglycerin p.r.n.  6. Valium 5 mg b.i.d. p.r.n.  7. Vicodin 5 mg p.r.n.   ALLERGIES:  CODEINE and MORPHINE.   SOCIAL HISTORY:  The patient is a heavy smoker and continues to smoke  over one pack per day with over a 50-pack-year smoking history.  She  denies alcohol use.  She is on disability.   FAMILY HISTORY:  Negative for coronary artery disease.   REVIEW OF SYSTEMS:  Positive for recent episodes of fevers, runny nose,  myalgias, which have resolved. Also positive for back pain.  Otherwise  10 systems reviewed, otherwise negative other than as noted in the HPI.   PHYSICAL EXAMINATION:  VITAL SIGNS:  Blood pressure 131/48; on repeat  right arm 161/71, left arm 162/44, temperature 97.3, pulse 53, oxygen  saturation 97% on room air.  GENERAL:  The patient breathing comfortably and in no apparent distress.  HEENT:  Normocephalic, atraumatic.  Extraocular movements intact.  Sclerae nonicteric.  NECK:  Supple.  No masses appreciated.  No carotid bruits or JVD.  CARDIOVASCULAR:  Regular rate and rhythm.  Normal S1 and S2.  Soft, 2/6  holosystolic murmur at the apex.  CHEST:  Poor air movement bilaterally.  No crackles or wheezes  appreciated.  ABDOMEN:  Soft, nontender, nondistended.  Normal bowel sounds.  EXTREMITIES:  Warm, no edema.  MUSCULOSKELETAL: No joint effusions or erythema.  SKIN:  No rashes.  NEUROLOGIC:  Alert and oriented x3.  Cranial nerves grossly intact.  Moving all extremities well.  PSYCHIATRY:  Alert and oriented x3.  Affect pleasant and appropriate.   LABORATORY DATA:  Chest  x-ray shows mild cardiomegaly which is stable,  no infiltrates or edema.  EKG reviewed shows sinus bradycardia at 54  beats per minute with nonspecific T wave abnormalities.  Unchanged from  previous.  White blood cell count 11, with normal differential.  Hemoglobin 15.3, hematocrit 45, platelets 28.  Sodium 141, potassium  4.4, chloride 106, bicarb 28, BUN 12, creatinine 0.6, glucose 109.  Troponin-I was 0.05. CK-MB less than 1.   ASSESSMENT:  A 60 year old female with coronary artery disease  presenting with chest and arm pain in the setting of marked  hypertension.  Her symptoms are largely atypical for myocardial ischemia  and her first set of cardiac biomarkers are negative and she has no  significant EKG changes.  Of note, her blood pressure did resolve on its  own without medical therapy.  Her chest x-ray does not show a widened  mediastinum and her blood pressure is equal in both arms, making  dissection appear unlikely.   PLAN:  1. We will admit to Dr. Dietrich Pates to rule out myocardial infarction.      We will also check a D-dimer although a pulmonary embolism seems      unlikely.  2. We will continue her aspirin.  We will  not start therapeutic      heparin at this time unless she has elevated biomarkers or EKG      changes.  3. We will continue her home regimen for hypertension and assess her      response.  She reports compliance with her medications, and if so,      she may need increasing of her lisinopril.  4. If she rules out for myocardial infarction and other evaluations      are unremarkable, she may be a candidate for discharge and an      outpatient stress test.     Lowella Bandy, MD  Electronically Signed    JJC/MEDQ  D:  07/08/2007  T:  07/08/2007  Job:  [id number]

## 2010-10-03 ENCOUNTER — Ambulatory Visit (INDEPENDENT_AMBULATORY_CARE_PROVIDER_SITE_OTHER)
Admission: RE | Admit: 2010-10-03 | Discharge: 2010-10-03 | Disposition: A | Discharge status: Home / Self Care | Payer: Medicare Other | Source: Ambulatory Visit | Attending: Internal Medicine | Admitting: Internal Medicine

## 2010-10-03 DIAGNOSIS — J984 Other disorders of lung: Secondary | ICD-10-CM

## 2010-10-04 NOTE — Progress Notes (Signed)
Quick Note:  Spoke with pt and notified of results per Dr. Wert. Pt verbalized understanding and denied any questions.  ______ 

## 2010-10-05 NOTE — H&P (Signed)
NAMEYOSHINO, BROCCOLI            ACCOUNT NO.:  [id number]   MEDICAL RECORD NO.:  [id number]          PATIENT TYPE:  INP   LOCATION:  2014                         FACILITY:  [facility]   PHYSICIAN:  Creta Levin, M.D. LHCDATE OF BIRTH:  [birth year]   DATE OF ADMISSION:  06/10/2004  DATE OF DISCHARGE:                                HISTORY & PHYSICAL   PRIMARY CARE PHYSICIAN:  Dr. Andrey Campanile.   CARDIOLOGIST:  Dr. Dietrich Pates.   CHIEF COMPLAINT:  Chest pain and left upper extremity pain.   HISTORY:  Ms. Harkless is a 60 year old woman with coronary artery disease  who is in her usual state of health until this evening when she developed  mid sternal chest discomfort that was typical of her cardiac chest pain.  This pain responded to sublingual nitroglycerin but lasted about 20 minutes.  About an hour later, she had some left upper extremity pain as well as some  fullness in the left neck. This also responded to nitroglycerin. She then  called me, and I advised her to come to the emergency room. There, she was  initially without complaints. She denied any dyspnea, diaphoresis, nausea,  orthopnea, PND, edema, palpitations, presyncope, or syncope.   PAST MEDICAL HISTORY:  1.  Coronary artery disease.      1.  PCI of a proximal LAD.      2.  Most recent catheter in September 2001 revealing the following:          Left main minor irregularities; LAD proximal stent with minimal in-          stent restenosis, calcification; D1 95% (jailed), small vessel; left          circumflex 25% proximal; RCA minor irregularities and possible          collaterals from the PDA to the diagonal distribution.   ALLERGIES:  CODEINE and MORPHINE cause nausea.   MEDICATIONS:  1.  Aspirin 81 mg q.d.  2.  Norvasc 5 mg q.d.  3.  Toprol 25 mg q.d.  4.  Zocor 40  mg q.d.   SOCIAL HISTORY:  She lives with [place] with her husband. She is  unemployed and disabled. She formally worked in [employer]. She  continues to  smoke at least a pack per day. She does not drink a significant amount of  alcohol. She denies illicit drugs or herbal medication use.   FAMILY HISTORY:  Her mother had a stroke and father had prostate cancer. She  is not aware of any coronary disease in her siblings.   REVIEW OF SYSTEMS:  Positive for the chest discomfort as well as back pain.  She also reports some possible glucose intolerance. The remainder of her  complete 10-point review of systems was negative except for that mentioned  above.   PHYSICAL EXAMINATION:  VITAL SIGNS:  Temperature 97.9, blood pressure  123/59, heart rate 69, respiratory rate 22, saturating 99% on room air.  GENERAL:  She is a hardened, middle-aged woman in no acute distress.  HEENT:  Unremarkable except for poor dentition.  NECK:  JVP appeared normal, carotid  upstroke normal, I do not feel there  were any bruits but probably just radiated heart sounds.  LUNGS:  Clear.  CARDIOVASCULAR:  Regular rate and rhythm with a normal S1 and normal S2, no  gallop, there was a 2/6 mid systolic murmur heard best at the right upper  sternal border and radiating to the neck.  ABDOMEN:  Benign.  RECTAL:  Heme-negative stool.  EXTREMITIES:  She did have bilateral femoral bruits; no clubbing, cyanosis,  or edema.  NEUROLOGICAL:  Nonfocal.   STUDIES:  Chest x-ray showed no air space disease. ECG:  Normal sinus rhythm  at 66 with nonspecific ST-T wave changes. Labs:  The white count was 11.6,  hematocrit 39.7, platelets 285. Chem 7:  Sodium 137, potassium 3.5, chloride  110, bicarb 25, BUN 13, creatinine 0.6, glucose 112. A GI panel from January  20 was normal. PTT was 30, PT 12.5 with an INR of 0.9. Lipids from January  20 revealed a total cholesterol of 173, triglycerides 190, HDL 23.8, LDL  111.   ASSESSMENT/PLAN:  1.  Unstable angina. She will be treated with aspirin, metoprolol,      Captopril, and Lipitor 80 mg. If she develops positive  biomarkers or has      ST segment deviation, then 2B3A inhibitor can be considered. She did      develop some chest discomfort after my evaluation, so she will be      started on nitroglycerin paste. For further evaluation, she will need      cardiac catheterization. I am holding off on giving her any Plavix until      her anatomy is defined.  2.  Dyslipidemia. In addition to changing her to Lipitor 80 mg, I will add      Niacin for her low HDL. There is concern about possible glucose      intolerance, so she will be monitored and kept on a sliding scale.  3.  Smoking. We talked about smoking cessation at length. She is very clear      that she needs to quit, and we need to make every effort to assist her      with doing so.       RPK/MEDQ  D:  06/10/2004  T:  06/10/2004  Job:  [id number]   cc:   M.D. Legrand Rams, M.D.

## 2010-10-05 NOTE — Consult Note (Signed)
   NAME:  [name], [name]                      ACCOUNT NO.:  [id number]   MEDICAL RECORD NO.:  [id number]                   PATIENT TYPE:  OUT   LOCATION:  GYN                                  FACILITY:  [facility]   PHYSICIAN:  De Blanch, M.D.         DATE OF BIRTH:  1951/03/22   DATE OF CONSULTATION:  11/17/2002  DATE OF DISCHARGE:                                   CONSULTATION   CHIEF COMPLAINT:  Postoperative visit.   INTERVAL HISTORY:  Since hospital discharge, the patient has done well.  She  denies any GI or GU symptoms, has no pelvic pain, pressure, vaginal bleeding  or discharge.  Her functional status is excellent.   HISTORY OF PRESENT ILLNESS:  Patient underwent exploratory laparotomy,  bilateral salpingo-oophorectomy, appendectomy, and omentectomy on Oct 05, 2002.  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  The  patient had an uncomplicated postoperative course.   PHYSICAL EXAMINATION:  VITAL SIGNS:  Weight 216, blood pressure 156/84.  GENERAL:  The patient is a healthy white female who is in no acute distress.  HEENT:  Negative.  NECK:  Supple without thyromegaly.  There is no supraclavicular or inguinal  adenopathy.  ABDOMEN:  Soft, nontender.  No mass, organomegaly, ascites, or hernias are  noted.  Midline incision is well healed.  PELVIC EXAM:  EG, BUS, vagina, bladder, and urethra are normal.  Cuff is  well supported and healed.  Bimanual and rectovaginal exam reveals no  masses, induration, or nodularity.   IMPRESSION:  Excellent postoperative recovery.   PLAN:  The patient was given the okay to return to full levels of activity.  She will return to the care of her primary care physician for routine  gynecologic care.                                               De Blanch, M.D.    DC/MEDQ  D:  11/17/2002  T:  11/17/2002  Job:  [id number]   cc:   Pricilla Riffle, M.D.   Telford Nab, R.N.  [address]  [place], [place] [address]   Malva Limes, M.D.  [address]  [place]  [place] [address]  Fax: [phone]   Randye Lobo, M.D.  [address], [address]  [place]  [place]  [address]  Fax: [phone]

## 2010-10-05 NOTE — Cardiovascular Report (Signed)
[name], [name]            ACCOUNT NO.:  [id number]   MEDICAL RECORD NO.:  [id number]          PATIENT TYPE:  INP   LOCATION:  2014                         FACILITY:  [facility]   PHYSICIAN:  Arturo Morton. Riley Kill, M.D. [facility] OF BIRTH:  [birth year]   DATE OF PROCEDURE:  06/12/2004  DATE OF DISCHARGE:                              CARDIAC CATHETERIZATION   INDICATIONS:  Ms. Koren is a 60 year old was had previous stenting of  the proximal left anterior descending artery. She has presented with  recurrent chest pain. Unfortunately she continues to smoke. Current study  was done to assess coronary anatomy.   PROCEDURE:  1.  Left heart catheterization  2.  Selective coronary arteriography.  3.  Selective left ventriculography.   DESCRIPTION OF PROCEDURE:  The patient was brought to the lab and prepped  and draped in usual fashion. Through an anterior puncture, the right femoral  artery was easily entered.  A  6 French sheath was placed.  Views of left  and right coronary arteries were obtained in multiple angiographic  projections. Central aortic and left ventricular pressures were then  measured with a pigtail.  Ventriculography was performed in the RAO  projection. We then reviewed the pictures with the patient. She was taken  off the table and into the holding area in satisfactory clinical condition.   Hemodynamic data.  1.  Central aortic pressure 175/73.  2.  Left ventricle 175/29.  3.  No gradient on pullback across aortic valve.   Angiographic data.  1.  Ventriculography was performed in the RAO projection. Overall systolic      function was preserved.  As on the previous study, there was a small      area of perhaps mild hypokinesis involving a posterobasal segment in the      RAO projection. Overall LV function appeared to be well-preserved with      ejection fraction in excess of 50%. There did not appear to be      significant mitral regurgitation.  2.  There is  extensive calcification of the coronary arteries and evidence      of a previously placed stent in the proximal LAD.  3.  The left main is calcified but free of critical disease.  4.  The LAD courses to the apex. The LAD has significant and substantial      calcification throughout its course. The LAD has mild luminal      irregularity with vessel being no more than about 20% throughout its      midportion in the previously stented area. No significant focal in-stent      restenosis is present.  The vessel wraps to the apex and wraps the      apical tip without significant high-grade disease. There is a small to      moderate first diagonal branch which has an 80-90% ostial stenosis and      then bifurcates.  This is similar to what has been described in the      previous report. This does not appear to be ideal for percutaneous  intervention.  5.  The circumflex consists of a marginal branch and an AV circumflex which      terminates as a second marginal branch. There are mild luminal      irregularities with perhaps 20% narrowing prior to the bifurcation of      the two marginals but no significant high-grade disease.  6.  The right coronary artery as on the previous study is calcified.  There      are three inferior branches, none of which appear to be critical. There      is mild luminal irregularity with perhaps 20% narrowing distally but      nothing high grade.   CONCLUSION:  1.  Preserved overall left ventricular function with ventriculogram that      appears similar to several years earlier.  2.  No evidence of in-stent restenosis.  3.  Continued high-grade ostial stenosis of small to intermediate diagonal      branch.   PLAN:  1.  Discontinue smoking.  2.  Continued medical therapy.  3.  Multiple issues with regard to risk factor reduction.  4.  Check D-dimer.  5.  Follow-up with Dr. Huston Foley.      TDS/MEDQ  D:  06/12/2004  T:  06/12/2004  Job:  [id number]   cc:    Pricilla Riffle, M.D.   [facility]

## 2010-10-05 NOTE — Op Note (Signed)
[facility]  Patient:    [name], [name]                   MRN: [id number] Proc. Date: 01/09/00 Adm. Date:  [id number] Attending:  Rich Brave CC:         Gloriajean Dell. Andrey Campanile, M.D.   Operative Report  PROCEDURE:  Upper endoscopy with Savary dilatation of the esophagus.  INDICATIONS:  60 year-old with recurrent dysphagia symptoms which, in the past, have consistently responded to Savary dilatation, although usually the esophageal ring causing these symptoms is not visibly fractured when she undergoes dilatation.  Her last dilatation was approximately six months ago.  DESCRIPTION OF PROCEDURE:  The nature, purpose and risks of the procedure were familiar to the patient from previous examinations.  She provided written consent after coming in a fasting state to the endoscopy area.  She was taken to radiology for fluoroscopic guidance during the procedure.  Fentanyl 75 mcg and Versed 8 mg IV were administered prior to and during the course of the procedure resulting in mild sedation without arrhythmias or desaturation.  The Olympus adult video endoscope was passed under direct vision.  The vocal cords were not well seen.  The esophagus was quite easily entered and had normal mucosa without evidence of reflux esophagitis, Barretts esophagus, varices, infection, or neoplasia.  At this time, I did not appreciate any discrete esophageal ring.  A 1-2 cm hiatal hernia was present.  The stomach was entered.  It contained a small bilious residual which was suctioned up. The gastric mucosa was unremarkable, perhaps just slightly erythematous but without focal lesions such as erosions, ulcers, polyps, or masses, and without significant gastritis.  The pylorus, duodenal bulb and second duodenum looked normal.  Savary dilatation was then performed in the standard fashion.  The spring-tipped guidewire was passed into the antrum of the stomach.  The  scope was removed in an exchange fashion, and a single passage was made over the guidewire using an 18 mm Savary dilator and fluoroscope guidance to confirm appropriate positioning of the wire and passage of the widest portion of the dilator below the level of the diaphragm.  The dilator and the guidewire were then removed, and the patient was re-endoscoped under direct vision which showed no evidence of undue trauma to the hypopharynx, esophagus, or stomach. There was no evident fracture of the esophageal ring.  The patient tolerated the procedure well, and there were no apparent complications.  IMPRESSION:  Dysphagia symptoms probably due to esophageal ring, now status post repeat Savary dilatation to 18 mm.  PLAN:  Clinical follow-up of dysphagia symptoms.  Proceed to flexible sigmoidoscopy since the patient reports recent rectal bleeding. DD:  01/09/00 TD:  01/10/00 Job: [id number]

## 2010-10-05 NOTE — Consult Note (Signed)
NAME:  [name], [name]                      ACCOUNT NO.:  [id number]   MEDICAL RECORD NO.:  [id number]                   PATIENT TYPE:  OUT   LOCATION:  GYN                                  FACILITY:  [facility]   PHYSICIAN:  De Blanch, M.D.         DATE OF BIRTH:  12/11/1950   DATE OF CONSULTATION:  DATE OF DISCHARGE:                                   CONSULTATION   GYNECOLOGY ONCOLOGY PROGRAM:   HISTORY OF PRESENT ILLNESS:  A 60 year old white female seen in consultation  at the request of Dr. Malva Limes.  The patient initially presented with  right lower quadrant pain for approximately 1-2 months and was seen at [facility].  CT scan obtained on April 29 showed that the  patient had a 6.2 x 5.6 cm right adnexal mass with high and low components  as well as some enlarged right external iliac nodes.  In the upper abdomen  she had several millimeter lesions in the right hepatic lobe and left  hepatic lobe too small to typify.  In addition she has a 10 mm lesion in the  liver consistent with a cyst.  Subsequently the patient was referred to Dr.  Malva Limes and the patient was seen by Dr. Randye Lobo.  An ultrasound  to better typify the ovarian mass revealed a complex adnexal mass on the  right adnexa measuring 7.7 x 5.8 x 5.5 cm.  It has a cystic component  centrally and an irregular wall which is thickened with a few thin  septations in this area.  On color doppler imaging there is very little  blood flow within the mass.  No ascites is noted.   PAST MEDICAL HISTORY:  Medical illnesses:  1. Congestive heart failure.  2. Cardiac ischemia.  She has a stent that was placed November 2000.  She     has not had any cardiac evaluation in the past year.  She denies any     angina.  3. Obesity.  4. Hypertension.   PAST SURGICAL HISTORY:  1. Cesarean section times two.  2. Cholecystectomy.  3. Ventral hernia repair.  4. Vaginal hysterectomy  1989.  5. D&C.  6. Bladder sling.   DRUG ALLERGIES:  1. CODEINE.  2. MORPHINE.   FAMILY HISTORY:  Reveals the patient's father had prostate cancer.   SOCIAL HISTORY:  The patient smokes one pack per day.  She is married.  She  is chronically disabled because of her cardiac disease.   CURRENT MEDICATIONS:  1. Toprol.  2. Norvasc.  3. Aspirin.  4. Zocor.   REVIEW OF SYSTEMS:  Reveals no pulmonary, GI or GU symptoms.  The patient  does know she has an esophageal stricture which is periodically dilated by  Dr. Matthias Hughs.  She has had colonoscopy within the past year which revealed  colonic polyps but no other lesions.   PHYSICAL EXAMINATION:  VITAL SIGNS:  Weight 231 pounds, height 5'5, blood  pressure 152/84, pulse 80, respirations 26.  GENERAL:  The patient is a healthy, obese, somewhat lethargic white female  in no acute distress.  HEENT:  Negative.  NECK:  Supple without thyromegaly.  LYMPHATICS:  There is no supraclavicular or inguinal adenopathy.  ABDOMEN:  Obese, soft, nontender.  No mass, organomegaly, ascites or hernias  are noted.  She has a well healed upper midline scar and a lower midline  scar which is somewhat retracted.  PELVIC:  EG, BUS, vaginal, bladder, urethra are normal.  The cuff is well  healed.  No lesions are noted.  Bimanual examination reveals fullness  throughout the pelvis with some tenderness.  Rectovaginal exam confirms.   IMPRESSION:  Complex pelvic mass in a menopausal patient.   RECOMMENDATIONS:  I recommend the patient undergo exploratory laparotomy for  resection of the mass.  She will need preoperative cardiology evaluation.  We will coordinate the surgery with either Dr. Dareen Piano or his partner Dr.  Conley Simmonds.                                               De Blanch, M.D.    DC/MEDQ  D:  09/22/2002  T:  09/23/2002  Job:  [id number]   cc:   Randye Lobo, M.D.  [address], [address]  [place]  [place]   [address]  Fax: [phone]   Malva Limes, M.D.  [address], [address]  [place]  [place] [address]  Fax: [phone]   Telford Nab, R.N.   Vesta Mixer, M.D.  [address], [address]  [place]  [place] [address]  Fax: [phone]

## 2010-10-05 NOTE — Cardiovascular Report (Signed)
[facility]  Patient:    [name], [name]                   MRN: [id number] Proc. Date: 01/23/00 Adm. Date:  [id number] Attending:  Koren Bound CC:         Cardiac Catheterization Laboratory                        Cardiac Catheterization  INDICATIONS:  Ms. Andujo is a 60 year old female with a history of coronary artery disease and cigarette smoking.  She presents with symptoms consistent with unstable angina.  Her symptoms have resolved with sublingual nitroglycerin.  She had a history of PTCA and stenting of her proximal LAD. She is referred for heart catheterization for further evaluation and management.  PROCEDURES:  Left heart catheterization with coronary angiography.  DESCRIPTION OF PROCEDURE:  The right femoral artery was cannulated with the assistance of a SmartNeedle.  HEMODYNAMICS:  The left ventricular pressure was 149/10 with an aortic pressure of 149/71.  ANGIOGRAPHY:  The left main coronary artery has minor luminal irregularities but is otherwise normal.  The left anterior descending artery has mild diffuse irregularities.  The proximal LAD stent has only minimal re-stenosis.  The mid and distal LAD have minor luminal irregularities but no critical stenoses.  There is a moderate amount of calcification throughout the LAD.  The first diagonal branch originates in the middle of the stented segment.  There is a 95% stenosis in the proximal aspect of this branch.  This branch divides very quickly into two very small branches.  The size of this diagonal vessel is approximately 1 mm in size.  It is not a good candidate for PTCA.  The left circumflex artery is a moderate sized vessel.  There is moderate degree of calcification in the proximal aspect.  There is a 20-25% stenosis proximally.  The circumflex artery gives off a moderate sized first diagonal branch which is fairly normal.  The second obtuse marginal  artery has minor luminal irregularities and is otherwise normal.  The terminal circumflex is unremarkable.  The right coronary artery is a moderate sized vessel and is dominant.  There are minor luminal irregularities throughout its course.  The posterior descending artery is unremarkable.  There are several small posterolateral branches which are normal.  There are some faint collaterals that fill from the PDA up through the septum.  They appear to be going toward the diagonal of distribution.  They also could be feeding some of the septal branches from the LAD which have some minor irregularities.  LEFT VENTRICULOGRAM:  The left ventriculogram is performed in a 30 RAO position.  It reveals overall normal left ventricular systolic function. Ejection fraction is approximately 65%.  There appears to be mild mitral regurgitation.  COMPLICATIONS:  None.  CONCLUSIONS:  Coronary artery disease primarily involving the left anterior descending and diagonal system.  The left anterior descending artery stent is widely patent with no obstruction of blood flow.  The first diagonal vessel originates in the middle of the stent and has a tight 95% stenosis.  Because of the location of the takeoff of this diagonal vessel and the small size of the diagonal vessel it is not a good candidate for percutaneous transluminal coronary angioplasty.  We will continue with medical therapy. The area of distribution of this diagonal vessel is very small.  She has minor luminal irregularities in the remaining vessels.  We will continue with progressive medical therapy.  The patient has been encouraged to stop smoking. D:  01/23/00 TD:  01/23/00 Job: [id number]

## 2010-10-05 NOTE — Op Note (Signed)
NAMEBRIONNA, [name]            ACCOUNT NO.:  [id number]   MEDICAL RECORD NO.:  [id number]          PATIENT TYPE:  AMB   LOCATION:  ENDO                         FACILITY:  [facility]   PHYSICIAN:  Bernette Redbird, M.D.   DATE OF BIRTH:  [birth year]   DATE OF PROCEDURE:  08/06/2004  DATE OF DISCHARGE:                                 OPERATIVE REPORT   PROCEDURE:  Colonoscopy with polypectomy.   ENDOSCOPIST:  Bernette Redbird, M.D.   INDICATIONS:  [age]-year-old female for surveillance of prior colonic  adenomas having been removed 3 years ago.   FINDINGS:  Medium-sized polyp removed from the area of the splenic flexure.   PROCEDURE:  The nature, purpose and risks of the procedure were familiar to  the patient from prior examination; she provided written consent.  Sedation  for this procedure and the upper endoscopy which preceded it totalled  fentanyl 100 mcg and Versed 10 mg IV without arrhythmias or desaturation.   The Olympus adult video colonoscope was readily advanced to the terminal  ileum and pullback was then performed.  The quality of prep was excellent  and it was felt that all areas were well-seen.   Near the splenic flexure was a 4 x 9-mm sessile polyp removed by cold snare  technique with transient oozing that stopped spontaneously.  The polyp was  suctioned through the scope for histologic analysis.  No other polyps were  seen and there was no evidence of cancer, colitis, vascular malformations or  diverticulosis.  I believe that both retroflexion in the rectum and re-  inspection of the rectum were unremarkable.   The patient tolerated the procedure well and there no apparent  complications.   IMPRESSION:  Solitary medium-sized polyp removed as described above.   PLAN:  Await pathology results.     RB/MEDQ  D:  08/06/2004  T:  08/07/2004  Job:  [id number]   cc:   Gloriajean Dell. Andrey Campanile, M.D.  [address]  [place]  [place] [address]  Fax: [phone]

## 2010-10-05 NOTE — Op Note (Signed)
NAMESOFFIA, DOSHIER                      ACCOUNT NO.:  [id number]   MEDICAL RECORD NO.:  [id number]                   PATIENT TYPE:  INP   LOCATION:  G401                                 FACILITY:  [facility]   PHYSICIAN:  De Blanch, M.D.         DATE OF BIRTH:  09-20-1950   DATE OF PROCEDURE:  10/05/2002  DATE OF DISCHARGE:                                 OPERATIVE REPORT   PREOPERATIVE DIAGNOSIS:  Complex pelvic mass.   POSTOPERATIVE DIAGNOSES:  Mucinous cystadenoma of the right ovary, extensive  pelvic adhesions.   PROCEDURE:  Bilateral salpingo-oophorectomy, lysis of adhesions,  appendectomy, partial omentectomy.   SURGEON:  De Blanch, M.D.   ASSISTANT:  Roseanna Rainbow, M.D. and Telford Nab, R.N.   ANESTHESIA:  General with oral tracheal tube.   ESTIMATED BLOOD LOSS:  200 mL   FINDINGS:  At exploratory laparotomy, the patient had extensive adhesions of  her omentum and sigmoid colon to the pelvis secondary to her prior pelvic  procedures. Once these were freed, the right ovary was found to measure  approximately 7 cm in diameter, had a smooth surface although there were  adherences to the sigmoid mesentery. On frozen section, this was felt to be  a mucinous cystadenoma with possible low malignant potential tumor elements.  For that reason, appendectomy and omentectomy for further staging were  performed. The left tube and ovary appeared normal except for adhesions to  the sigmoid colon. Exploration of the upper abdomen including the liver and  diaphragm, small and large bowel and stomach were grossly normal except for  adhesions around the liver secondary to her cholecystectomy.   DESCRIPTION OF PROCEDURE:  The patient was brought to the operating room and  after satisfactory attainment of general anesthesia was placed in the  modified lithotomy position in Clover stirrups. The anterior abdominal wall,  perineum and vagina were  prepped with Betadine, a Foley catheter was placed,  and the patient was draped. The abdomen was entered through a low midline  incision excising her prior old scar which was retracted and inverted.  Peritoneal washings were obtained from the pelvis. Adhesions of the omentum  to the pelvis were lysed. The upper abdomen was explored with the above  noted findings. A Bookwalter retractor was positioned and the bowel packed  out of the pelvis. The right pelvic sidewall was opened identifying the  external iliac artery and vein, internal iliac artery and ureter. The  ovarian vessels were skeletonized, clamped, cut, free tied and suture  ligated. The adherent ovary to the right pelvic sidewall was then freed  using sharp and blunt dissection with care to avoid injury to the ureter. An  anterior peritoneal incision was made freeing the ovarian mass from the  bladder peritoneum. The remainder of the mass was attached to the vaginal  cuff which was cross clamped, divided and suture ligated. The mass was sent  to pathology with the above noted findings.  A retractor was repositioned and  the left pelvic sidewall was exposed, the peritoneum was opened and the  retroperitoneal structures identified. The ovarian vessels were  skeletonized, clamped, cut and suture ligated. Again the peritoneum of the  pelvic sidewall was incised along with the residual ovary which was adherent  to it. The pelvis was inspected and found to be hemostatic.   Because this is a mucinous tumor, appendectomy was performed. The appendix  was long and redundant and it had adhesions which were freed using the  Bovie. The base of the appendix was cross clamped, divided and suture  ligated using 2-0 Vicryl. A pursestring suture was placed around the base of  the appendix and the appendix inverted. It was noted there was some bleeding  from the mesocolon and a clamp was placed across and then suture ligated  securing the blood  supply to the appendix.   The omentum was brought in the field and an infracolic omentectomy was  performed. The vascular pedicles were clamped, cut, and suture ligated.   The pelvis and abdomen were reinspected and found to be hemostatic. The  packs and retractors were removed and the anterior abdominal wall was closed  in layers, the first being a running mass closure of #1 PDS. The  subcutaneous tissue was irrigated, hemostasis achieved with cautery. Two  subcutaneous retention sutures were then placed. The subcutaneous tissue was  reapproximated with interrupted 4-0 Vicryl suture. The skin was closed with  skin staples. The retention sutures were then tied over skin bridges. A  dressing was applied. The patient was awakened from anesthesia and taken to  the recovery room in satisfactory condition. Sponge, needle and instrument  counts were correct x2.                                               De Blanch, M.D.    DC/MEDQ  D:  10/05/2002  T:  10/05/2002  Job:  [id number]   cc:   Telford Nab, R.N.   Roseanna Rainbow, M.D.  [address]  [place]  [place] [address]  Fax: [phone]   Malva Limes, M.D.  [address]  [place]  [place] [address]  Fax: [phone]   Pricilla Riffle, M.D.

## 2010-10-05 NOTE — Discharge Summary (Signed)
NAMETALAYLA, [name]            ACCOUNT NO.:  [id number]   MEDICAL RECORD NO.:  [id number]          PATIENT TYPE:  INP   LOCATION:  2014                         FACILITY:  [facility]   PHYSICIAN:  Olga Millers, M.D. LHCDATE OF BIRTH:  [birth year]   DATE OF ADMISSION:  06/10/2004  DATE OF DISCHARGE:  06/13/2004                                 DISCHARGE SUMMARY   PROCEDURES:  1.  Cardiac catheterization.  2.  Coronary arteriogram.  3.  Left ventriculogram.   HOSPITAL COURSE:  [name] is a 60 year old female with known coronary  artery disease.  She had a stent to the LAD in the past and her last  catheterization showed a small diagonal that was jailed by the previously  placed stent and had a 95% stenosis.  Medical therapy was the best option  and she had been doing well.  She came to the hospital on 06/10/04 for chest  pain and left upper extremity pain.  She was admitted to rule out myocardial  infarction and for further evaluation.   It was felt that she needed a cardiac catheterization to further define her  anatomy.  Her cardiac enzymes were negative for myocardial infarction, and a  cardiac catheterization was performed on 06/12/04.   The cardiac catheterization showed no in-stent restenosis in the LAD.  She  had calcification and some minor irregularities in the proximal LAD and  proximal circumflex.  The diagonal that was jailed by the LAD stent had an  80% stenosis that was considered unchanged from previous catheterizations.  She had some inferior hypokinesis but otherwise no wall motion abnormalities  on her left ventriculogram.  Dr. Riley Kill evaluated the films and felt that a  D-dimer should be checked to evaluate for pulmonary embolism.  He felt that  if the D-dimer was negative, then she could be discharged on 06/13/04.   [name] had Imdur added to her medication regimen for the diagonal  stenosis.  She had a hemoglobin A1c checked which was elevated at 6.3.   She  was asked to make dietary changes and seen by the diabetes nurse.  She is  being given a prescription for a Glucometer and is to follow up with her  primary care physician.  A lipid profile was checked which showed a total  cholesterol of 173, triglycerides 190, HDL 24, LDL 111.  She is on Zocor 40  mg a day and will follow up in the office with medication changes made at  that time on a p.r.n. basis.  The D-dimer came back within normal limits, so  no further evaluation was performed for pulmonary embolism.  [name]  was without chest pain or shortness of breath and considered stable for  discharge on 06/13/04 with outpatient followup arranged.   DISCHARGE DIAGNOSES:  1.  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  2.  Hyperlipidemia.  3.  History of percutaneous transluminal coronary angioplasty and stent to      the LAD.  4.  Hypertension.  5.  Reserved left ventricular function with an ejection fraction in excess  of 50% by catheterization this admission.  6.  New onset diabetes with a hemoglobin A1c of 6.3.  7.  History of lumbar disk problems.  8.  Status post resection of a mucinous cystadenofibroma on the right ovary.  9.  Status post appendectomy and omentectomy.  10. Allergy or intolerance to codeine and morphine.   DISCHARGE INSTRUCTIONS:  1.  Her activity level is to include no strenuous activity for two days.  2.  She is to call the office for problems with the catheterization site.  3.  She is to stick to a low-fat diabetic diet.  4.  She is not to use tobacco.  5.  She is to follow up with Dr. Tenny Craw on 07/05/04 at 11:45 a.m. and call Dr.      Andrey Campanile for an appointment.   DISCHARGE MEDICATIONS:  1.  Imdur 30 mg, 1/2 tab every day.  2.  Aspirin 325 mg p.m.  3.  Lopressor 25 mg b.i.d.  4.  Lisinopril 5 mg every day.  5.  Lipitor 80 mg every day.  6.  Niacin 500 mg q.h.s.      RB/MEDQ  D:  06/13/2004  T:  06/13/2004  Job:  [id number]    cc:   Arturo Morton. Riley Kill, M.D. [facility]   Gloriajean Dell. Andrey Campanile, M.D.  [address]  [place]  [place] [address]  Fax: [phone]   Pricilla Riffle, M.D.

## 2010-10-05 NOTE — Op Note (Signed)
NAMEEMALINA, DUBREUIL                      ACCOUNT NO.:  [id number]   MEDICAL RECORD NO.:  [id number]                   PATIENT TYPE:  INP   LOCATION:  3004                                 FACILITY:  [facility]   PHYSICIAN:  Danae Orleans. Venetia Maxon, M.D.               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   OPERATION PERFORMED:  Right L2-3 bilateral microdiskectomy with  microdissection.   SURGEON:  Danae Orleans. Venetia Maxon, M.D.   ASSISTANT:  Clydene Fake, M.D.   ANESTHESIA:  General endotracheal.   ESTIMATED BLOOD LOSS:  Minimal.   COMPLICATIONS:  None.   DISPOSITION:  Recovery.   INDICATIONS FOR PROCEDURE:  Teresa Nicodemus is a 60 year old woman with  right hip flexor weakness and a far lateral disk herniation at L2-3 with  significant pain. It was elected to take her to surgery for microdiskectomy  in far lateral position L2-3 on the right.   DESCRIPTION OF PROCEDURE:  Ms. Bensen was brought to the operating room.  Following the satisfactory and uncomplicated induction of general  endotracheal anesthesia and placement of intravenous lines, the patient was  placed in a prone position on the Wilson frame.  The back was then prepped  and draped in the usual sterile fashion.  A spinal needle was placed at what  was felt to be the L2-3 level and this was confirmed on intraoperative x-  ray.  Subsequently, a midline incision was made and carried to the  lumbodorsal fascia on the right side which was exposed on the right side of  midline.  A fascial flap was then created with scalpel and Metzenbaum  scissors and this was reflected medially.   The multifidi and longissimus  muscles were then split with finger dissection exposing the L2-3 facet joint  complex and the L3 transverse process, and the pars of L2.  The self-  retaining retractor was placed to facilitate exposure.  Intraoperative x-ray  was obtained with a marker probe on the pars interarticularis of L2.  Subsequently using loupe magnification, the transverse process of L3 was  identified and the facet joint complex and pars interarticularis L2 were  then identified.  The pars and lateral aspect of the facet were then thinned  with high speed drill and bone removal was completed with Kerrison rongeurs.  The lateral aspect of the thecal sac was identified and the L2 nerve root  was identified as it coursed beneath the intertransverse ligament.  The  nerve root appeared to be quite tethered by very significantly scarred in  disk material.  This was mobilized using microdissection technique and there  was a  large amount of herniated disk material which was directly beneath the  L2 nerve root.  There did not appear to be a free fragment of herniated disk  material but this extended from the interspace.  The interspace was then  entered with micropituitary and using a variety of Epstein curets and  dissecting hooks, the nerve root was gradually mobilized and decompressed  and disk material was removed both cephalad and lateral which were directly  compressing the L2 nerve root.  An osteophyte removing tool was used to  remove a lip of hypertrophied bony ridge on the inferior aspect of the L2  vertebra.  Subsequently it was felt that the thecal sac, the L2 nerve root  were well decompressed.  Hemostasis was assured with Gelfoam soaked in  Thrombin.  The nerve root was then bathed in 2 mL of fentanyl and 80 mg of  Depo-Medrol.  The self-retaining retractor was removed. The microscope was  taken out of the field.  The lumbodorsal fascia was reapproximated with 0  Vicryl suture.   The subcutaneous tissues were reapproximated with 2-0 Vicryl  interrupted inverted sutures and the skin edges were reapproximated with  interrupted 3-0 Vicryl subcuticular stitch.  The wound was dressed with  DermaBond.  The patient was taken to the recovery room in stable and  satisfactory condition having tolerated the operation well.  Counts were  correct at the end of the case.                                               Danae Orleans. Venetia Maxon, M.D.    JDS/MEDQ  D:  01/03/2004  T:  01/04/2004  Job:  [id number]

## 2010-10-05 NOTE — Op Note (Signed)
NAME:  [name], [name]                      ACCOUNT NO.:  [id number]   MEDICAL RECORD NO.:  [id number]                   PATIENT TYPE:  AMB   LOCATION:  ENDO                                 FACILITY:  [facility]   PHYSICIAN:  Florencia Reasons, M.D.             DATE OF BIRTH:  10/10/1950   DATE OF PROCEDURE:  01/14/2002  DATE OF DISCHARGE:  01/14/2002                                 OPERATIVE REPORT   PROCEDURE PERFORMED:  Upper endoscopy with Savory dilatation of the  esophagus.   INDICATIONS FOR PROCEDURE:  Recurrent dysphagia symptoms in a 60 year old  female who has had multiple previous dilatations in the past, most recently  about a year ago.  This is a typical interval for the patient to have  dilatation.  A few days ago, the patient called to let us know that she had  had some minimal hematemesis after getting choked on some chips.  Note, that  she is on a daily aspirin.   FINDINGS:  Small hiatal hernia with esophageal ring.  Dilatation performed  to 18 mm.   DESCRIPTION OF PROCEDURE:  The nature, purpose and risks of the procedure  were familiar to the patient from prior examination.  She provided written  consent.  Sedation was Fentanyl 100 mcg and Versed 10 mg IV without  arrhythmias or desaturation.  The Olympus video endoscope was passed without  significant difficulty under direct vision.  The vocal cords appeared  grossly normal.  The esophagus was normal, without evidence of reflux  esophagitis.  There was a 2 cm hiatal hernia present with a widely patent  esophageal mucosa ring that offered no resistence to passage of the scope.  The scope entered the stomach which contained no residual or blood or coffee  ground material.  The gastric mucosa was normal, without evidence of aspirin  induced gastropathy and without evidence of any erosions, ulcers, polyps,  masses or gastritis including a retroflex view of the proximal stomach which  was unremarkable. The pylorus,  duodenal bulb and second duodenum looked  normal.   Savory dilatation was then performed in the standard fashion.  The spring  tip guide wire was passed through the scope into the antrum of the stomach  and the scope was removed in an exchange fashion.  Using fluoroscopic  guidance, after confirming appropriate positioning of the wire, an 18 mm  Savory dilator was slid over the guide wire and advanced until its widest  portion reached below the level of the diaphragm.  It was left in place for  about 20 to 30 seconds to hopefully stretch the ring and/or the lower  esophageal sphincter muscle and the dilator and the guide wire were then  removed.  The patient was rescoped under direct vision.  There was no  evidence of any fracture of the ring or any undue trauma to the hypopharynx,  esophagus, or stomach.  The scope was removed  from the patient.  She  tolerated the procedure well and there were no apparent complications.   IMPRESSION:  Esophageal ring dilated to 18 mm.   PLAN:  Clinical follow up of  dysphagia symptoms.                                                Florencia Reasons, M.D.   RVB/MEDQ  D:  01/14/2002  T:  01/18/2002  Job:  [id number]   cc:   Gloriajean Dell. Andrey Campanile, M.D.

## 2010-10-05 NOTE — Discharge Summary (Signed)
   [name], [name]                      ACCOUNT NO.:  [id number]   MEDICAL RECORD NO.:  [id number]                   PATIENT TYPE:  INP   LOCATION:  0445                                 FACILITY:  [facility]   PHYSICIAN:  Roseanna Rainbow, M.D.         DATE OF BIRTH:  1950-08-11   DATE OF ADMISSION:  10/05/2002  DATE OF DISCHARGE:  10/13/2002                                 DISCHARGE SUMMARY   CHIEF COMPLAINT:  The patient is a 60 year old Caucasian female with a  complex pelvic mass who presents for exploratory laparotomy and resection of  the mass.   HISTORY OF PRESENT ILLNESS:  Please see the dictated history and physical as  per De Blanch, M.D. for further details.   HOSPITAL COURSE:  The patient was admitted and underwent a bilateral  salpingo-oophorectomy, appendectomy, and omentectomy.  Please see the  dictated operative summary for further details.  Her postoperative course  was remarkable for a fever of 100.8 within the first 24 hours.  She  thereafter defervesced and remained afebrile for the remainder of her  hospital course.  She developed an ileus that responded to bowel rest and IV  hydration.  She also began to complain of a choking sensation with attempts  at p.o. intake prior to discharge.  The patient reported a history of an  esophageal stricture for which she is being followed by Bernette Redbird, M.D.  On the day of discharge she was tolerating p.o. and a follow-up abdominal x-  ray was consistent with an improvement in her bowel gas pattern.   DISCHARGE DIAGNOSES:  Mucinous cystadenofibroma right ovary.   PROCEDURE:  1. Bilateral salpingo-oophorectomy.  2. Appendectomy.  3. Omentectomy.   CONDITION ON DISCHARGE:  Stable.   DIET:  Regular.   ACTIVITY:  No strenuous activity.   MEDICATIONS:  1. Resume home medications.  2. Percocet one to two tablets p.o. q.4-6h. p.r.n.   DISPOSITION:  The patient was to follow up in the GYN  oncology office on May  31 for an incision check and she was also counseled to follow up with Bernette Redbird, M.D.                                               Roseanna Rainbow, M.D.    LAJ/MEDQ  D:  10/13/2002  T:  10/13/2002  Job:  [id number]   cc:   Bernette Redbird, M.D.  [address]  [place], [place] [address]  Fax: [phone]   Telford Nab, R.N.   Malva Limes, M.D.  [address]  [place]  [place] [address]  Fax: [phone]   Pricilla Riffle, M.D.

## 2010-10-05 NOTE — Op Note (Signed)
[facility]  Patient:    [name], [name] Visit Number: [id number] MRN: [id number]          Service Type: SUR Location: 3W 0380 01 Attending Physician:  Laqueta Jean Dictated by:   Vonzell Schlatter Patsi Sears, M.D. Proc. Date: 07/23/01 Admit Date:  07/23/2001                             Operative Report  PREOPERATIVE DIAGNOSIS:  Urinary incontinence.  POSTOPERATIVE DIAGNOSIS:  Urinary incontinence.  OPERATION:  Pubovaginal sling, anterior vaginal vault repair.  SURGEON:  Sigmund I. Patsi Sears, M.D.  ANESTHESIA:  General endotracheal.  PREPARATION:  After appropriate preanesthesia, the patient is brought to the operating room and placed on the operating table in the dorsal supine position where a general endotracheal anesthesia was introduced.  She was then re-placed in the low Allen stirrup dorsal lithotomy position where the pubis was prepped with Betadine solution and draped in the usual fashion.  REVIEW OF HISTORY:  [name] is a 60 year old female, with stress urinary incontinence, wearing five pads a day.  She leaks with cough, laugh, sneeze, as well as with urge and without awareness and with intercourse.  She leaks in a flood.  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  She is status post TAH, with a 50-pack-year history of smoking, para 1-1-0.  She is now for pubovaginal sling, anterior vaginal vault repair.  She understands that she will probably need anticholinergic medication in the future.  DESCRIPTION OF PROCEDURE:  A 1.5 cm incision is made in the midline vaginal tissue at the level of the urethra and bladder neck.  Subcutaneous tissue was dissected lateralward, and a pocket is made for the [facility] sling.  The  two 1 cm suprapubic incisions are made and the subcutaneous tissue dissected and the [facility] needles placed behind the pubic bone into the vaginal incision. Cystoscopy was accomplished which showed no evidence of any trauma to the urethra or the bladder.  Photodocumentation of the bladder, and hypermobility in the urethra is noted.  Following this, the [facility] sling is placed with no tension noted.  A 2 x 7 cm portion of Tutoplast fascia is then placed on top of the [facility] sling to provide extra material to protect the [facility] sling, from the midline of the vaginal incision.  The midline vaginal incision is closed after the [facility] sling wings are cut underneath the level of the skin closed with running 4-0 Vicryl, Benzoin, Steri-Strips, and sterile dressing.  The vagina is closed with a running 3-0 Vicryl suture.  Following this, anterior vaginal vault repair is accomplished by making an 8 cm midline incision, subcutaneous tissue dissected with sharp and blunt dissection.  Following this, Kelly plication is accomplished with horizontal mattress 3-0 Vicryl sutures placed.  No skin was removed because the patient is still sexually active.  The vaginal epithelium is closed with a running 3-0 Vicryl suture.  No bleeding was noted.  The patient tolerated the procedure reasonably well and was awakened and taken to the recovery room in good condition. Dictated by:   Vonzell Schlatter Patsi Sears, M.D. Attending Physician:  Laqueta Jean DD:  07/23/01 TD:  07/23/01 Job: [id number]

## 2010-10-05 NOTE — Procedures (Signed)
[facility]  Patient:    [name], [name]                   MRN: [id number] Proc. Date: 01/09/00 Adm. Date:  [id number] Attending:  Rich Brave CC:         Gloriajean Dell. Andrey Campanile, M.D.   Procedure Report  PROCEDURE:  Flexible sigmoidoscopy with biopsies.  SURGEON:  Florencia Reasons, M.D.  INDICATIONS:  A 60 year old who reported rectal bleeding last night, fairly profuse.  FINDINGS:  Normal unprepped exam up to about 50 cm with brown stool present and no evidence of blood.  Tiny polyps biopsied.  DESCRIPTION OF PROCEDURE:  The patient provided written consent for the procedure.  Sedation for this procedure consisted only of what she had already received for her upper endoscopy with Savary dilatation which was performed immediately prior to it (fentanyl 75 mcg and Versed 8 mg IV).  The Olympus adult video upper endoscope which had been used for her Savary dilatation was used for this procedure as well, and this easily advanced to about 48 cm where upon I encountered quite a bit of semi-formed stool and pull back was initiated.  Up to the length of the exam, which was done unprepped, there was no evidence of any residual blood whatsoever.  There was quite a bit of stool, both liquid and solid.  At about 30 cm, I encountered a sessile, 3-4 mm polyp, removed by one or two cold biopsies, and in the distal rectum, I encountered one or two small (2 mm) hyperplastic-appearing polyps which I also cold biopsied.  No large polyps, cancer, colitis, vascular malformations, or obvious diverticulosis were noted. Retroflexion and pull out through the anal canal did not demonstrate significant or severe internal hemorrhoids.  The patient tolerated this procedure well and there were no apparent complications.  IMPRESSION:  Diminutive colonic and rectal polyps, removed as described above. No evidence of persistent, recurrent, or ongoing bleeding, and no  definite bleeding site identified, which makes me think that this was probably hemorrhoidal bleeding which has now resolved.  PLAN:  Reassurance.  Await pathology on rectal polyps. DD:  01/09/00 TD:  01/10/00 Job: [id number]

## 2010-10-05 NOTE — Procedures (Signed)
[facility]  Patient:    [name], [name]                   MRN: [id number] Proc. Date: 12/02/00 Adm. Date:  [id number] Attending:  Rich Brave CC:         Gloriajean Dell. Andrey Campanile, M.D.   Procedure Report  PROCEDURE:  Upper endoscopy with Savary dilatation of the esophagus.  ENDOSCOPIST:  Florencia Reasons, M.D.  INDICATIONS:  A 60 year old female with longstanding recurrent dysphagia symptoms which always seem to respond to Savary dilatation, even though most of the time a discrete ring or stricture is not endoscopically identified.  FINDINGS:  Empiric dilatation to 18 mm.  DESCRIPTION OF PROCEDURE:  The nature, purpose, and risks of the procedure were familiar to the patient who provided written consent.  Sedation was fentanyl 75 mcg and Versed 8 mg IV without arrhythmias or desaturation.  The Olympus video endoscope was passed under direct vision.  The vocal cords were briefly seen and appeared grossly normal.  The esophagus was endoscopically normal without evidence of reflux esophagitis, Barretts esophagus, varices, infection, or neoplasia.  There was a 2 cm hiatal hernia but no ring or stricture identified.  The stomach contained no significant residual, just some clear small residual which was suctioned up.  No gastritis, erosions, ulcers, polyps, or masses were seen, and a retroflexed view of the proximal stomach was unremarkable.  The pylorus, duodenal bulb, and second duodenum looked normal.  Savary dilatation was then performed in the standard fashion.  The spring-tipped guidewire was advanced into the antrum of the stomach, and after fluoroscopic confirmation of the appropriate positioning of the wire, a single passage with an 18 mm Savary dilator was accomplished under fluoroscopic guidance, confirming passage of the widest portion of the dilator below the level of the diaphragm.  The dilator and guidewire were then removed, and  the patient again underwent endoscopy under direct vision which showed no evidence of any obvious fracture of ring or other abnormality and no evidence of undue trauma to the hypopharynx, esophagus, or stomach.  The patient tolerated the procedure well, and there were no apparent complications.  IMPRESSION:  Small hiatal hernia with empiric Savary dilatation to 18 mm performed as described above.  PLAN:  Clinical followup of dysphagia symptoms. DD:  12/02/00 TD:  12/02/00 Job: [id number]

## 2010-10-05 NOTE — Op Note (Signed)
[name], [name]            ACCOUNT NO.:  [id number]   MEDICAL RECORD NO.:  [id number]          PATIENT TYPE:  AMB   LOCATION:  ENDO                         FACILITY:  [facility]   PHYSICIAN:  Bernette Redbird, M.D.   DATE OF BIRTH:  [birth year]   DATE OF PROCEDURE:  08/06/2004  DATE OF DISCHARGE:                                 OPERATIVE REPORT   PROCEDURE:  Upper endoscopy.   INDICATIONS:  Intensified reflux symptoms in a patient who was coming in  today for surveillance colonoscopy. Because of the intensified symptoms, we  discussed __________ and decided to proceed with endoscopic evaluation as  well.   FINDINGS:  Small hiatal hernia with esophageal ring. Moderate bile reflux.   PROCEDURE:  The nature, purpose, risks of the procedure were familiar to the  patient from multiple previous procedures and she provided written consent.  Sedation was fentanyl 75 mcg and Versed 7.5 milligrams IV without  arrhythmias or desaturation.   The Olympus small-caliber adult video endoscope was passed under direct  vision, entering the esophagus easily. The vocal cords were not well seen.   The esophagus was endoscopically normal, without evidence of reflux  esophagitis, Barrett's esophagus, varices, infection or neoplasia. There was  a small hiatal hernia with a widely patent esophageal ring present.   The stomach contained a moderate bilious residual, probably about 50 mL or  so, but the gastric mucosa was free of inflammatory changes, specifically I  did not see any gastritis, erosions, ulcers, polyps or masses including a  retroflexed view of the cardia and the pylorus, duodenal bulb and second  duodenum looked normal.   The scope was removed from the patient who tolerated the procedure well and  without apparent complications.   IMPRESSION:  1.  Reflux, probably accounting for the patient's symptoms (530.81)  2.  Small hiatal hernia with esophageal ring.  3.  Bile present in the  stomach. This can be a physiologic finding but can      be reflective of gastric dysmotility.   PLAN:  Consider trial of sucralfate and/or prokinetic agents such as  Zelnorm.      RB/MEDQ  D:  08/06/2004  T:  08/07/2004  Job:  [id number]   cc:   Gloriajean Dell. Andrey Campanile, M.D.  [address]  [place]  [place] [address]  Fax: [phone]

## 2010-10-05 NOTE — Procedures (Signed)
[employer]  Patient:    [name], [name] Visit Number: [id number] MRN: [id number]          Service Type: END Location: ENDO Attending Physician:  Rich Brave Dictated by:   Florencia Reasons, M.D. Proc. Date: 06/01/01 Admit Date:  06/01/2001   CC:         Gloriajean Dell. Andrey Campanile, M.D.   Procedure Report  PROCEDURE:  Colonoscopy with biopsies.  INDICATION:  Screening for colon cancer in a 60 year old female.  FINDINGS:  Several small polyps scattered around the colon, cold biopsied.  DESCRIPTION OF PROCEDURE:  The patient provided written consent for the procedure.  Sedation was fentanyl 85 mcg and Versed 8.5 mg IV without arrhythmias or desaturation.  The Olympus adult video colonoscope was advanced without much difficulty to the cecum, and pullback was then performed.  The cecum was identified by typical cecal appearance.  In the cecum and also in the proximal ascending colon and then in the rectosigmoid region, I encountered several small sessile polyps, each about 2-4 mm in diameter, most of them about 3 mm, each removed by one or two or perhaps three cold biopsies.  No large polyps, cancer, colitis, vascular malformations, or diverticular disease were observed.  The quality of the prep was fairly good, so it was felt that no significant lesions would have been missed.  Retroflexion was not performed in the rectum.  The patient tolerated the procedure well, and there were no apparent complications.  IMPRESSION:  Small colon polyps, removed as described above.  PLAN:  Await pathology on polyps. Dictated by:   Florencia Reasons, M.D. Attending Physician:  Rich Brave DD:  06/01/01 TD:  06/01/01 Job: [id number]

## 2010-12-25 ENCOUNTER — Other Ambulatory Visit: Payer: Self-pay | Admitting: Family Medicine

## 2010-12-25 ENCOUNTER — Ambulatory Visit
Admission: RE | Admit: 2010-12-25 | Discharge: 2010-12-25 | Disposition: A | Discharge status: Home / Self Care | Payer: Medicare Other | Source: Ambulatory Visit | Attending: Family Medicine | Admitting: Family Medicine

## 2010-12-25 MED ORDER — IOHEXOL 300 MG/ML  SOLN: 100.0000 mL | Freq: Once | INTRAMUSCULAR | Status: AC | PRN

## 2010-12-26 ENCOUNTER — Inpatient Hospital Stay (HOSPITAL_COMMUNITY)
Admission: AD | Admit: 2010-12-26 | Discharge: 2010-12-29 | DRG: 391 | Disposition: A | Discharge status: Home / Self Care | Payer: Medicare Other | Source: Ambulatory Visit | Attending: Internal Medicine | Admitting: Internal Medicine

## 2010-12-26 DIAGNOSIS — M545 Low back pain, unspecified: Secondary | ICD-10-CM | POA: Diagnosis present

## 2010-12-26 DIAGNOSIS — F17200 Nicotine dependence, unspecified, uncomplicated: Secondary | ICD-10-CM | POA: Diagnosis present

## 2010-12-26 DIAGNOSIS — K859 Acute pancreatitis without necrosis or infection, unspecified: Secondary | ICD-10-CM | POA: Diagnosis present

## 2010-12-26 DIAGNOSIS — I10 Essential (primary) hypertension: Secondary | ICD-10-CM | POA: Diagnosis present

## 2010-12-26 DIAGNOSIS — R911 Solitary pulmonary nodule: Secondary | ICD-10-CM | POA: Diagnosis present

## 2010-12-26 DIAGNOSIS — J4489 Other specified chronic obstructive pulmonary disease: Secondary | ICD-10-CM | POA: Diagnosis present

## 2010-12-26 DIAGNOSIS — R1033 Periumbilical pain: Principal | ICD-10-CM | POA: Diagnosis present

## 2010-12-26 DIAGNOSIS — Z7982 Long term (current) use of aspirin: Secondary | ICD-10-CM

## 2010-12-26 DIAGNOSIS — J449 Chronic obstructive pulmonary disease, unspecified: Secondary | ICD-10-CM | POA: Diagnosis present

## 2010-12-26 DIAGNOSIS — I2510 Atherosclerotic heart disease of native coronary artery without angina pectoris: Secondary | ICD-10-CM | POA: Diagnosis present

## 2010-12-26 DIAGNOSIS — R0789 Other chest pain: Secondary | ICD-10-CM | POA: Diagnosis not present

## 2010-12-26 DIAGNOSIS — E785 Hyperlipidemia, unspecified: Secondary | ICD-10-CM | POA: Diagnosis present

## 2010-12-26 DIAGNOSIS — IMO0001 Reserved for inherently not codable concepts without codable children: Secondary | ICD-10-CM | POA: Diagnosis present

## 2010-12-26 DIAGNOSIS — I739 Peripheral vascular disease, unspecified: Secondary | ICD-10-CM | POA: Diagnosis present

## 2010-12-26 DIAGNOSIS — F411 Generalized anxiety disorder: Secondary | ICD-10-CM | POA: Diagnosis present

## 2010-12-26 DIAGNOSIS — G8929 Other chronic pain: Secondary | ICD-10-CM | POA: Diagnosis present

## 2010-12-26 LAB — CARDIAC PANEL(CRET KIN+CKTOT+MB+TROPI)
CK, MB: 1.6 ng/mL (ref 0.3–4.0)
Relative Index: INVALID (ref 0.0–2.5)
Troponin I: <0.3 ng/mL (ref <0.30)

## 2010-12-27 LAB — URINALYSIS, MICROSCOPIC ONLY
Bilirubin Urine: NEGATIVE
Glucose, UA: NEGATIVE mg/dL
Ketones, ur: NEGATIVE mg/dL
Protein, ur: NEGATIVE mg/dL
pH: 5.5 (ref 5.0–8.0)

## 2010-12-27 LAB — BASIC METABOLIC PANEL
BUN: 10 mg/dL (ref 6–23)
CO2: 27 mEq/L (ref 19–32)
Calcium: 8.9 mg/dL (ref 8.4–10.5)
Creatinine, Ser: 0.54 mg/dL (ref 0.50–1.10)
Glucose, Bld: 96 mg/dL (ref 70–99)
Sodium: 137 mEq/L (ref 135–145)

## 2010-12-27 LAB — HEPATIC FUNCTION PANEL
ALT: 6 U/L (ref 0–35)
AST: 13 U/L (ref 0–37)
Albumin: 3.2 g/dL — ABNORMAL LOW (ref 3.5–5.2)
Alkaline Phosphatase: 62 U/L (ref 39–117)
Total Protein: 6.6 g/dL (ref 6.0–8.3)

## 2010-12-27 LAB — CBC
HCT: 37.6 % (ref 36.0–46.0)
Hemoglobin: 12.9 g/dL (ref 12.0–15.0)
RBC: 4.17 MIL/uL (ref 3.87–5.11)
WBC: 9.3 10*3/uL (ref 4.0–10.5)

## 2010-12-27 LAB — CARDIAC PANEL(CRET KIN+CKTOT+MB+TROPI)
Relative Index: 0.7 (ref 0.0–2.5)
Total CK: 686 U/L — ABNORMAL HIGH (ref 7–177)

## 2010-12-28 DIAGNOSIS — R079 Chest pain, unspecified: Secondary | ICD-10-CM

## 2010-12-28 LAB — CARDIAC PANEL(CRET KIN+CKTOT+MB+TROPI)
CK, MB: 1.5 ng/mL (ref 0.3–4.0)
Relative Index: INVALID (ref 0.0–2.5)
Relative Index: INVALID (ref 0.0–2.5)
Relative Index: INVALID (ref 0.0–2.5)
Total CK: 64 U/L (ref 7–177)
Total CK: 68 U/L (ref 7–177)
Troponin I: <0.3 ng/mL (ref <0.30)

## 2011-01-01 NOTE — Consult Note (Signed)
NAMEGIANINA, [name] NO.:  [id number]  MEDICAL RECORD NO.:  [id number]  LOCATION:  3022                         FACILITY:  [facility]  PHYSICIAN:  Veverly Fells. Excell Seltzer, MD  DATE OF BIRTH:  June 15, 1950  DATE OF CONSULTATION:  12/28/2010 DATE OF DISCHARGE:                                CONSULTATION   REASON FOR CONSULTATION:  Chest pain.  CHIEF COMPLAINT:  Chest pain.  HISTORY OF PRESENT ILLNESS:  [name] is a 60 year old woman with history of coronary artery disease.  She was admitted on December 26, 2010, with abdominal pain and elevated lipase.  There was concern over pancreatitis.  However, her CT scan of the abdomen showed no acute inflammation of the pancreas and showed no clear etiology of the patient's abdominal pain.  She underwent EGD demonstrating tiny erosions in the gastric antrum with moderate inflammation in the pylorus.  The suspected etiology of the patient's abdominal pain is mild pancreatitis.  We were asked to see the patient for chest pain.  She has had pain in the upper abdomen radiating to the chest on and off for the past few days.  Her chest pain has not been pleuritic.  She describes it as a deep, pressure-like pain.  The pain has been relieved by belching. Initially, she thought her pain felt like prior heart pain today, but states after having a bowel movement, the pain has completely resolved. She denies dyspnea, exertional chest pain, edema, or palpitations.  The patient has a history of remote PCI.  She had stenting of the proximal LAD.  Her last cardiac catheterization was in January 2006 and this demonstrated normal LV systolic function without mitral regurgitation.  There was calcification of the coronary arteries with a patent stent in the proximal LAD.  There was a small diagonal branch with high-grade ostial stenosis unfavorable for PCI.  The circumflex and right coronary arteries had no significant obstructive disease.   Medical therapy was recommended.  The patient was hospitalized in 2009 with chest pain.  She underwent an outpatient stress test for follow up and this demonstrated no significant ischemia.  She has done well since that time.  MEDICATIONS: 1. Aspirin 325 mg daily. 2. Dulcolax. 3. Nicotine patch. 4. Protonix. 5. Senokot.  ALLERGIES:  CODEINE and MORPHINE.  PAST MEDICAL HISTORY: 1. CAD in 2001.  The patient had a stent in her LAD.  Followup cath     showed no in-stent restenosis and significant disease in the small     diagonal. 2. Hypertension. 3. Hyperlipidemia. 4. Tobacco.  SOCIAL HISTORY:  The patient is married.  She lives in [place].  She has a long history of smoking.  She does not drink alcohol or use illicit drugs.  FAMILY HISTORY:  The patient's mother died in her 46s of a stroke. Father died in his 64s from cancer.  There is no premature CAD in the family.  REVIEW OF SYSTEMS:  Negative except as per HPI.  PHYSICAL EXAMINATION:  GENERAL:  The patient is alert and oriented.  No acute distress. VITAL SIGNS:  Temperature is 98.8, heart rate is 51, respiratory rate 18, blood pressure 109/68, oxygen saturation 96% on room air. HEENT:  Normal. NECK:  Normal carotid upstrokes, bilateral bruits, right greater than left.  No thyromegaly or thyroid nodules.  JVP is normal. LUNGS:  Clear bilaterally. HEART:  Regular rate and rhythm.  The apex is discrete and nondisplaced. There is a 2/6 systolic ejection murmur at the left lower sternal border. ABDOMEN:  Soft and nontender.  No organomegaly.  No bruits. BACK:  No CVA tenderness. EXTREMITIES:  No clubbing, cyanosis, or edema.  Peripheral pulses are intact and equal. SKIN:  Warm and dry without rash.  EKG shows sinus rhythm with heart rate of 61 beats per minute, no acute changes.  Cardiac markers are negative for multiple sets, creatinine is 0.5.  CBC is within normal limits.  ASSESSMENT:  This is a 60 year old  woman with known coronary artery disease and atypical chest pain.  I suspect her symptoms are related to her pancreatitis.  I do not think cardiac disease or coronary ischemia is causing her pain symptoms.  These symptoms are highly atypical and now have resolved after a bowel movement.  If she has recurrence of her chest pain, I would favor performing an outpatient nuclear stress test to rule out significant ischemia.  If she does not have recurrent symptoms, I do not think any further workup is needed.  The patient should remain on low-dose aspirin 81 mg if this was okay with the GI Team.  Tobacco cessation was advised.  She will follow up with her primary cardiologist, Dr. Tenny Craw.  An appointment was arranged for January 31, 2011 at 2 p.m.  Thank for the opportunity to see [name] in consultation.  Please feel free to call at any time with question.     Veverly Fells. Excell Seltzer, MD     MDC/MEDQ  D:  12/28/2010  T:  12/29/2010  Job:  [id number]  cc:   Pricilla Riffle, MD, [facility] Henri Medal, MD  Electronically Signed by Tonny Bollman MD on 01/01/2011 05:27:20 AM

## 2011-01-02 NOTE — H&P (Signed)
[name], Jasmine NO.:  [id number]  MEDICAL RECORD NO.:  [id number]  LOCATION:  3022                         FACILITY:  [facility]  PHYSICIAN:  Marcellus Scott, MD     DATE OF BIRTH:  May 09, 1951  DATE OF ADMISSION:  12/26/2010 DATE OF DISCHARGE:                             HISTORY & PHYSICAL   PRIMARY CARE PHYSICIAN:  Henri Medal, MD with [facility] [place].  CHIEF COMPLAINTS:  Abdominal pain.  HISTORY OF PRESENT ILLNESS:  [name] is a 60 year old female patient with history of hypertension, hyperlipidemia, coronary artery disease status post PCI, peripheral artery disease, tobacco abuse who was sent as an direct admission to the medical floor by her primary care physician.  She apparently was in her usual state of health until approximately a week ago when she noted gradual onset of abdominal pain around the umbilical area.  Initially it was intermittent, but since then has progressively gotten worse in severity and has been constant. She is unable to characterize the abdominal pain, 10 times it radiates to her mid lower chest.  She was worried that she may be even having a heart attack and took some sublingual nitroglycerin, which did not make any difference to the pain.  She is still tolerating diet and with food at times it eases the pain, at other times it makes no difference.  She has had no nausea or vomiting.  She has no fevers or chills.  She has had normal BM including one this morning.  She is passing flatus.  She indicates that she had a colonoscopy and EGD by Dr. Matthias Hughs sometime last year and the only thing that she cannot tell was benign colonic polyps.  She has been taking uncoated 325 mg of aspirin because she ran out of her coated aspirin for the last 2 weeks.  She denies any precordial chest pain or dyspnea.  There is no urinary frequency or dysuria.  The primary care physician performed multiple blood  tests which showed elevated lipase of 200.  Also a CT of the abdomen and pelvis with contrast was done which did not show any acute findings.  The patient was referred to the hospital for inpatient evaluation and management.  PAST MEDICAL HISTORY: 1. Hypertension. 2. Hyperlipidemia. 3. Coronary artery disease status post PCI. 4. Peripheral artery disease. 5. Tobacco abuse. 6. Anxiety disorder. 7. COPD. 8. Chronic pain/fibromyalgia. 9. Tension type headache. 10.Lung nodules.  PAST SURGICAL HISTORY: 1. Back surgery. 2. Cholecystectomy. 3. Coronary artery bypass graft. 4. Hysterectomy. 5. EGD and colonoscopy with findings as indicated above. 6. Bronchoscopy with right middle lobe lavage in March 2011 by Dr.     Kalman Shan which showed normal airway exam and the lavage     fluid was negative for malignant cells.  ALLERGIES: 1. CODEINE DERIVATIVES. 2. MORPHINE SULFATE.  HOME MEDICATIONS: 1. Lisinopril 20 mg p.o. daily. 2. Nitroglycerin 0.4 mg sublingual p.r.n. 3. Metoprolol XL 50 mg p.o. daily. 4. Lipitor 20 mg p.o. daily. 5. Diazepam 5 mg p.o. t.i.d. p.r.n. for anxiety.  FAMILY HISTORY:  Positive for cancer and stroke.  SOCIAL HISTORY:  The patient is a ongoing smoker, but does not  wish to tell how much she smokes.  She says she just smokes too many and wishes to quit.  There is no history of alcohol or drug abuse.  She is independent of activities of daily living.  REVIEW OF SYSTEMS:  All systems reviewed apart from history of presenting illness is negative.  PHYSICAL EXAMINATION:  GENERAL:  [name] is moderately built and overweight female patient who is in mild-to-moderate painful distress. VITAL SIGNS:  Temperature 97.8 degrees Fahrenheit, pulse 72 per minute, respirations 17 per minute, blood pressure 146/63 mmHg, and saturating at 98% on room air. HEAD, EYES, ENT:  Nontraumatic, normocephalic.  Pupils are equally reacting to light and accommodation.   Oral mucosa is mildly dry.  No other acute findings. NECK:  Supple.  Right carotid bruit. LYMPHATICS:  No lymphadenopathy. RESPIRATORY:  Clear.  No increased work of breathing. CARDIOVASCULAR:  First and second heart sounds heard, regular.  No JVD or murmurs. ABDOMEN:  Nondistended.  Midline laparotomy scar.  The patient is tender in the umbilical and epigastric quadrants.  Abdomen, however, is soft. There is no rigidity, guarding, or rebound.  No organomegaly or mass appreciated.  Bowel sounds are normally heard. CENTRAL NERVOUS SYSTEM:  The patient is awake, alert, oriented x3 with no focal neurological deficit. EXTREMITIES:  Grade 5/5 power.  No cyanosis, clubbing, or edema. Peripheral pulses are symmetrically felt. SKIN:  Without any rashes.  LABORATORY DATA:  Lab data from those performed at her primary care physician's office yesterday, her comprehensive metabolic panel is within normal limits.  Anion gap is 10 mEq.  Amylase is 48.  Lipase is 200.  CBC within normal limits.  H. Pylori is negative.  CT of the abdomen and pelvis with contrast, impression, A.  Three lung nodules at the right lung base appears stable compared to prior PET CT. B.  No abdominal pelvic mass or adenopathy. C.  Atheromatous changes of the aorta.  No evidence of abdominal aortic aneurysm.  ASSESSMENT AND PLAN: 1. Abdominal pain.  Etiology is unclear.  Although her lipase is     mildly elevated suggesting mild pancreatitis, a CT abdomen does not     show any findings of pancreatitis.  The other differential     diagnosis obviously are gastritis, peptic ulcer disease,     gastroesophageal reflux disease.  The patient is admitted to a     medical floor.  We will obtain serum lactate, a set of cardiac     enzymes, and EKG.  We will place her on clear liquid diet.  We will     place her on IV Protonix.  [facility] has been     consulted to assist with her evaluation and management. 2.  Hypertension.  Continue her home dose of lisinopril and metoprolol. 3. Coronary artery disease.  The patient denies any chest pain or     dyspnea.  We will obtain EKG and a set of cardiac enzymes.  We will     place her on enteric-coated aspirin. 4. Anxiety disorder.  Continue her home Valium p.r.n. 5. Tobacco abuse.  Cessation counseling done. 6. Code status was discussed with the patient in the presence of her     daughter and the patient wishes to be a full code.  Time taken in coordinating this history and physical note is 1 hour.     Marcellus Scott, MD     AH/MEDQ  D:  12/26/2010  T:  12/26/2010  Job:  [id number]  cc:   Henri Medal, MD Pricilla Riffle, MD, [facility]  Electronically Signed by Marcellus Scott MD on 01/02/2011 12:50:49 PM

## 2011-01-02 NOTE — Discharge Summary (Signed)
NAMEKAMRYNN, MELOTT NO.:  [id number]  MEDICAL RECORD NO.:  [id number]  LOCATION:  3022                         FACILITY:  [facility]  PHYSICIAN:  Marcellus Scott, MD     DATE OF BIRTH:  17-Mar-1951  DATE OF ADMISSION:  12/26/2010 DATE OF DISCHARGE:  12/29/2010                              DISCHARGE SUMMARY   PRIMARY CARE PHYSICIAN:  Henri Medal, MD.  CARDIOLOGIST:  Pricilla Riffle, MD, [facility].  DISCHARGE DIAGNOSES: 1. Abdominal pain. 2. Possible mild pancreatitis. 3. Atypical  chest pain, myocardial infarction ruled out. 4. Hypertension. 5. Tobacco abuse. 6. Coronary artery disease. 7. Anxiety disorder. 8. Chronic back pain. 9. Hyperlipidemia. 10.Peripheral artery disease. 11.Chronic obstructive pulmonary disease. 12.Chronic pain/fibromyalgia. 13.Lung nodules.  DISCHARGE MEDICATIONS: 1. Dulcolax 10 mg rectally daily p.r.n., constipation. 2. Vicodin 5/325 mg tablet, one tablet p.o. q.6 hourly p.r.n. pain. 3. Nicotine 21 mg per 24-hour patch transdermally daily. 4. Pantoprazole 40 mg p.o. daily. 5. Senna two tablets p.o. daily. 6. Diazepam 5 mg p.o. t.i.d. p.r.n. anxiety. 7. Metoprolol XL 50 mg p.o. daily. 8. Nitroglycerin 0.4 mg sublingual q.5 minutes p.r.n. for chest pain.  DISCONTINUED MEDICATIONS: 1. Lipitor. 2. Lisinopril.  PROCEDURES: 1. EGD done by Dr. Herbert Moors on December 27, 2010, impression;     a.     Few tiny erosions in the antrum.     b.     Moderate inflammation just inside the pylorus.  IMAGING: 1. CT of the abdomen and pelvis with contrast on August 7, impression;     a.     Three lung nodules at the right lung base appears stable      compared to the prior PET CT.     b.     No abdominal or pelvic mass or adenopathy.     c.     atheromatous changes of the aorta.  No evidence of abdominal      aortic aneurysm.  LAB DATA: 1. Cardiac enzymes were cycled x3 and were negative. 2. Liver function tests only significant for  albumin of 3.2,     otherwise, within normal limits.  Lipase was 121 on August 9.     Basic metabolic panel within normal limits.  CBC within normal     limits.  Urinalysis was not suggestive of urinary tract infection.     Serum folate was 12.2.  venous lactate was 0.7.  CONSULTATIONS: 1. Cardiology, Dr. Tonny Bollman, [facility]. 2. Gastroenterology, Dr. Charlott Rakes.  DIET:  Low fat soft diet.  ACTIVITY:  Ad lib.  TODAY'S COMPLAINTS:  The patient indicates that her abdominal pain is more than 40% better.  She has tolerated soft diet.  She has had a BMI after rectal suppository yesterday.  She denies any chest pain.  She indicates chronic low back pain for Cipro which she sees a orthopedic MD.  PHYSICAL EXAMINATION:  GENERAL:  The patient is in no obvious distress. VITAL SIGNS:  Temperature 98.1 degrees Fahrenheit, pulse 63 per minute, respirations 20 per minute, blood pressure left arm 138/62 mmHg and right arm 124/58 mmHg. RESPIRATORY SYSTEM:  Clear. CARDIOVASCULAR SYSTEM:  First and second heart sounds heard and regular. No  murmurs or JVD. ABDOMEN:  Nondistended, mild periumbilical region tenderness, but abdomen is soft.  No rigidity, guarding, or rebound.  Normal bowel sounds heard. CENTRAL NERVOUS SYSTEM:  The patient is awake, alert, oriented x3 with no focal neurological deficits.  HOSPITAL COURSE:  Ms. Nachtigal is a 60 year old female patient with history of hypertension, hyperlipidemia, coronary artery disease status post PCI, peripheral artery disease, ongoing tobacco abuse, chronic pain, and lung nodules which have been evaluated by bronchoscopy by the pulmonologist.  She was now sent by her primary care physician for evaluation of abdominal pain.  This started about a week prior to admission in the umbilical region and apparently had gotten progressively worse, but she continued to tolerate diet.  There was no nausea, vomiting.  She had been taking  uncoated aspirin for 2 weeks prior to admission.  1. Abdominal pain.  The etiology is not fully clear.  Although, the     lipase was mildly elevated, her CT of the abdomen did not show any     features of pancreatitis.  Given her history of consuming uncoated     aspirin, upper GI pathology had to be ruled out.  [facility] was consulted.  They kindly performed EGD with     findings as above.  They do not feel convinced that the EGD     findings are the cause for her abdominal pain.  They suspect that     she may have mild pancreatitis.  The patient has not looked in     painful distress, but she indicates that she continues to have pain     and has been requesting IV pain medications almost as scheduled.     However, she does not appear in pain when these episodes that she     complains.  She indicates that she takes Vicodin at home for her     chronic back pain, but currently seems to have run out.  Initially,     told this dictator that she had some of those left but then     indicated that she may not have many left and is requesting a     script to tide here over until she sees her orthopedic MD.     Unclear if there is some element of opioid dependence from her     chronic pain.  We will provide a very short supply of Vicodin to     tide here over this acute phase and then she is to follow up with     her primary MD for further pain management issues. 2. Atypical chest pain.  The night before last she complained of some     chest pain which she indicated may have been secondary to     constipation.  However, given her history of coronary artery     disease and risk factors Cardiology was consulted.  They agreed     that this was a atypical chest pain and recommended outpatient     follow-up with the cardiologist and have arranged for follow-up     appointment.  The patient is advised to continue her home coated     aspirin. 3. Hypertension.  There was brief periods  of hypotension, however, the     patient is asymptomatic, and when actually her blood pressures are     done manually, they seemed to be normal.  Her lisinopril and     metoprolol had been held  for approximately 2 days.  We will     initiate just the metoprolol and the lisinopril decision can be     revisited when she sees her primary care physician.  Lipitor was     also currently held secondary to her presumed mild pancreatitis. 4. Tobacco abuse.  Cessation counseling done and the patient requests     a nicotine patch which will be provided. 5. Lung nodules.  Evaluated in the past by bronchoscopy.  This can be     followed as an outpatient as deemed necessary. 6. Anxiety disorder.  This also seems to be one of the factors in her     pain management.  DISPOSITION:  The patient is discharged home in stable condition.  This dictator has updated her care on a regular basis to her daughter, Ms. Tammy Whitehead.  FOLLOW-UP RECOMMENDATIONS: 1. With Dr. Jackalyn Lombard.  The patient is to call for an     appointment to be seen in 4-5 days. 2. With Dr. Dietrich Pates on the September 13 at 2 p.m. 3. With Dr. Charlott Rakes.  The patient can call for an appointment     as needed.  Time taken in coordinating this discharge is 30 minutes.     Marcellus Scott, MD     AH/MEDQ  D:  12/29/2010  T:  12/29/2010  Job:  [id number]  cc:   Pricilla Riffle, MD, [facility] Henri Medal, MD Shirley Friar, MD  Electronically Signed by Marcellus Scott MD on 01/02/2011 11:33:26 PM

## 2011-01-08 NOTE — Consult Note (Signed)
[name], [name]            ACCOUNT NO.:  [id number]  MEDICAL RECORD NO.:  [id number]  LOCATION:  3022                         FACILITY:  [facility]  PHYSICIAN:  Shirley Friar, MDDATE OF BIRTH:  Jan 10, 1951  DATE OF CONSULTATION:  12/26/2010 DATE OF DISCHARGE:                                CONSULTATION   INDICATIONS:  Abdominal pain.  HISTORY OF PRESENT ILLNESS:  Ms. Tyndall is a 60 year old white female who was admitted this morning with a 1-week history of periumbilical abdominal pain that is intermittent and sharp when it comes on.  She does report radiation up into her epigastric area and to her chest area. She denies any associated nausea and vomiting.  She has had some constipation around the time onset of this abdominal pain which is unusual which has yet to resolve.  She states that she moved a bowel some after the CT scan contrast that she was given.  She did change from a coated aspirin to regular 325-mg aspirin and she recently ran out and questions whether that contributed to her abdominal pain.  She denies any other NSAIDs.  She denies any previous periumbilical pain such as this.  She reports a history of dysphagia in the past and reports multiple dilations of her esophagus in the past and she thinks last endoscopy was last year that is not available at the time of this dictation.  She denies any heartburn.  She reports having a colonoscopy in the past and says she has a history of colon polyps but that result is not available either.  She was seen by her primary care physician and had a normal liver function tests, but was found to have an elevated lipase of 200 with a normal amylase, that lab was done on December 25, 2010.  A repeat has not been done.  A CT scan with contrast showed a normal-appearing pancreas without a dilated pancreatic duct.  She has had a previous cholecystectomy.  Complete record of the CT scan can be found in the hospital  record.  PAST MEDICAL HISTORY:  Hypertension, hyperlipidemia, coronary artery disease, peripheral artery disease, tobacco abuse, anxiety, COPD, chronic pain/fibromyalgia, history of headaches, history lung nodules, status post cholecystectomy, status post back surgery, status post coronary artery bypass graft, hysterectomy, history of EGD and colonoscopy, history of bronchoscopy.  MEDICATIONS ON ADMISSION:  Lisinopril, nitroglycerin sublingual, metoprolol, Lipitor, diazepam.  ALLERGIES:  CODEINE, MORPHINE.  FAMILY HISTORY:  Noncontributory.  SOCIAL HISTORY:  Denies alcohol, positive tobacco.  REVIEW OF SYSTEMS:  Negative from GI standpoint except as stated above.  PHYSICAL EXAMINATION:  VITAL SIGNS:  Temperature 97.7, pulse 65, blood pressure 151/60. GENERAL:  Alert, well nourished, in no acute distress. ABDOMEN:  Periumbilical tenderness with guarding, soft, nondistended, positive bowel sounds.  LABS:  As stated above (labs were from December 25, 2010 at her primary care physician's office) and repeat pertinent labs have yet to be done.  IMPRESSION:  A 60 year old white female with a lipase of 200 with a normal-appearing pancreas on CT scan, presenting with 1-week history of periumbilical abdominal pain without nausea, vomiting.  Her elevated lipase is consistent with pancreatitis based on labs which is present despite normal CT  scan.  Peptic ulcer disease is possible, but her pain is not consistent with an ulcer although that still could be present. We will need an upper endoscopy to rule out peptic ulcer disease due to the recent change in her NSAIDs, daily aspirin, as well as the elevated lipase with a normal CT scan of the pancreas.  We will continue on clear liquids otherwise and supportive care.  We will recheck her lipase and LFTs in the a.m.  We will try and find her last colonoscopy and upper endoscopy.  She has had worsening constipation recently and her pain could be  related to bowels, but the elevated lipase would not be related to that.     Shirley Friar, MD     VCS/MEDQ  D:  12/26/2010  T:  12/27/2010  Job:  [id number]  cc:   Bernette Redbird, M.D.  Electronically Signed by Charlott Rakes MD on 01/08/2011 04:51:10 PM

## 2011-01-30 NOTE — Op Note (Signed)
  [name], [name] NO.:  [id number]  MEDICAL RECORD NO.:  [id number]  LOCATION:  3022                         FACILITY:  [facility]  PHYSICIAN:  Graylin Shiver, M.D.   DATE OF BIRTH:  10/09/1950  DATE OF PROCEDURE:  12/27/2010 DATE OF DISCHARGE:                              OPERATIVE REPORT   INDICATIONS FOR PROCEDURE:  Periumbilical and epigastric abdominal pain, rule out peptic ulcer disease.  The patient does have an elevated lipase, suggestive of pancreatitis, although CT scan of the pancreas looked normal.  Informed consent was obtained after explanation of the risks of bleeding, infection, and perforation.  PREMEDICATIONS:  Fentanyl 60 mcg IV, Versed 5 mg IV.  DESCRIPTION OF PROCEDURE:  With the patient in the left lateral decubitus position, the Pentax gastroscope was inserted into the oropharynx and passed into the esophagus.  It was advanced down the esophagus, then into the stomach, and into the duodenum.  The second portion and bulb of the duodenum looked normal.  At the junction of the pylorus and the duodenal bulb, there was moderate inflammation just inside the pylorus.  I looked carefully in this area and did not appreciate a definite ulcer crater, but it did appear inflamed.  The antrum showed a few tiny erosions, consistent with the patient's history of using aspirin, but there was no significant ulcer or inflammatory disease.  The body of the stomach looked normal.  The fundus and cardia looked normal.  The esophagus looked normal.  She tolerated the procedure well without complications.  IMPRESSION: 1. Few tiny erosions in the antrum. 2. Moderate inflammation just inside the pylorus.  I would recommend treating this patient with proton pump inhibitor.  I do not think that these findings are the cause of her abdominal pain that she probably has a mild case of pancreatitis.          ______________________________ Graylin Shiver,  M.D.     SFG/MEDQ  D:  12/27/2010  T:  12/27/2010  Job:  [id number]  cc:   [facility]  Electronically Signed by Herbert Moors MD on 01/30/2011 08:52:57 AM

## 2011-01-31 ENCOUNTER — Encounter: Payer: Medicare Other | Admitting: Internal Medicine

## 2011-01-31 ENCOUNTER — Inpatient Hospital Stay (HOSPITAL_COMMUNITY)
Admission: EM | Admit: 2011-01-31 | Discharge: 2011-02-02 | DRG: 641 | Disposition: A | Discharge status: Home / Self Care | Payer: Medicare Other | Attending: Internal Medicine | Admitting: Internal Medicine

## 2011-01-31 DIAGNOSIS — J449 Chronic obstructive pulmonary disease, unspecified: Secondary | ICD-10-CM | POA: Diagnosis present

## 2011-01-31 DIAGNOSIS — G8929 Other chronic pain: Secondary | ICD-10-CM | POA: Diagnosis present

## 2011-01-31 DIAGNOSIS — M549 Dorsalgia, unspecified: Secondary | ICD-10-CM | POA: Diagnosis present

## 2011-01-31 DIAGNOSIS — Z9861 Coronary angioplasty status: Secondary | ICD-10-CM

## 2011-01-31 DIAGNOSIS — E860 Dehydration: Principal | ICD-10-CM | POA: Diagnosis present

## 2011-01-31 DIAGNOSIS — R011 Cardiac murmur, unspecified: Secondary | ICD-10-CM | POA: Diagnosis present

## 2011-01-31 DIAGNOSIS — F411 Generalized anxiety disorder: Secondary | ICD-10-CM | POA: Diagnosis present

## 2011-01-31 DIAGNOSIS — E785 Hyperlipidemia, unspecified: Secondary | ICD-10-CM | POA: Diagnosis present

## 2011-01-31 DIAGNOSIS — J984 Other disorders of lung: Secondary | ICD-10-CM | POA: Diagnosis present

## 2011-01-31 DIAGNOSIS — I739 Peripheral vascular disease, unspecified: Secondary | ICD-10-CM | POA: Diagnosis present

## 2011-01-31 DIAGNOSIS — I2510 Atherosclerotic heart disease of native coronary artery without angina pectoris: Secondary | ICD-10-CM | POA: Diagnosis present

## 2011-01-31 DIAGNOSIS — J4489 Other specified chronic obstructive pulmonary disease: Secondary | ICD-10-CM | POA: Diagnosis present

## 2011-01-31 DIAGNOSIS — R6889 Other general symptoms and signs: Secondary | ICD-10-CM | POA: Diagnosis present

## 2011-01-31 DIAGNOSIS — I10 Essential (primary) hypertension: Secondary | ICD-10-CM | POA: Diagnosis present

## 2011-01-31 DIAGNOSIS — IMO0001 Reserved for inherently not codable concepts without codable children: Secondary | ICD-10-CM | POA: Diagnosis present

## 2011-01-31 DIAGNOSIS — R55 Syncope and collapse: Secondary | ICD-10-CM | POA: Diagnosis present

## 2011-01-31 DIAGNOSIS — F17200 Nicotine dependence, unspecified, uncomplicated: Secondary | ICD-10-CM | POA: Diagnosis present

## 2011-02-01 ENCOUNTER — Emergency Department (HOSPITAL_COMMUNITY): Payer: Medicare Other

## 2011-02-01 ENCOUNTER — Encounter (HOSPITAL_COMMUNITY): Payer: Self-pay

## 2011-02-01 DIAGNOSIS — R42 Dizziness and giddiness: Secondary | ICD-10-CM

## 2011-02-01 DIAGNOSIS — M7989 Other specified soft tissue disorders: Secondary | ICD-10-CM

## 2011-02-01 DIAGNOSIS — I517 Cardiomegaly: Secondary | ICD-10-CM

## 2011-02-01 DIAGNOSIS — M79609 Pain in unspecified limb: Secondary | ICD-10-CM

## 2011-02-01 LAB — URINALYSIS, ROUTINE W REFLEX MICROSCOPIC
Glucose, UA: NEGATIVE mg/dL
Hgb urine dipstick: NEGATIVE
Ketones, ur: NEGATIVE mg/dL
Protein, ur: NEGATIVE mg/dL

## 2011-02-01 LAB — DIFFERENTIAL
Basophils Absolute: 0 10*3/uL (ref 0.0–0.1)
Eosinophils Relative: 2 % (ref 0–5)
Lymphocytes Relative: 37 % (ref 12–46)
Lymphs Abs: 4.3 10*3/uL — ABNORMAL HIGH (ref 0.7–4.0)
Monocytes Relative: 11 % (ref 3–12)
Neutro Abs: 5.9 10*3/uL (ref 1.7–7.7)

## 2011-02-01 LAB — TROPONIN I
Troponin I: <0.3 ng/mL (ref <0.30)
Troponin I: <0.3 ng/mL (ref <0.30)

## 2011-02-01 LAB — POCT I-STAT TROPONIN I: Troponin i, poc: 0.01 ng/mL (ref 0.00–0.08)

## 2011-02-01 LAB — CK TOTAL AND CKMB (NOT AT ARMC)
CK, MB: 1.4 ng/mL (ref 0.3–4.0)
CK, MB: 1.3 ng/mL (ref 0.3–4.0)
Relative Index: INVALID (ref 0.0–2.5)
Relative Index: INVALID (ref 0.0–2.5)
Total CK: 61 U/L (ref 7–177)
Total CK: 60 U/L (ref 7–177)

## 2011-02-01 LAB — CBC
Hemoglobin: 14.1 g/dL (ref 12.0–15.0)
MCV: 90.9 fL (ref 78.0–100.0)
Platelets: 273 10*3/uL (ref 150–400)
RBC: 4.62 MIL/uL (ref 3.87–5.11)
WBC: 11.7 10*3/uL — ABNORMAL HIGH (ref 4.0–10.5)

## 2011-02-01 LAB — COMPREHENSIVE METABOLIC PANEL
ALT: 8 U/L (ref 0–35)
AST: 15 U/L (ref 0–37)
CO2: 28 mEq/L (ref 19–32)
Chloride: 101 mEq/L (ref 96–112)
Creatinine, Ser: 0.63 mg/dL (ref 0.50–1.10)
GFR calc Af Amer: >60 mL/min (ref >60)
GFR calc non Af Amer: >60 mL/min (ref >60)
Glucose, Bld: 98 mg/dL (ref 70–99)
Sodium: 138 mEq/L (ref 135–145)
Total Bilirubin: 0.3 mg/dL (ref 0.3–1.2)

## 2011-02-01 NOTE — H&P (Signed)
NAMEZIVA, NUNZIATA NO.:  [id number]  MEDICAL RECORD NO.:  [id number]  LOCATION:  WLED                         FACILITY:  [facility]  PHYSICIAN:  Talmage Nap, MD  DATE OF BIRTH:  08-11-1950  DATE OF ADMISSION:  02/01/2011 DATE OF DISCHARGE:                             HISTORY & PHYSICAL   PRIMARY CARE PHYSICIAN:  Gloriajean Dell. Andrey Campanile, M.D., [facility].  History is obtainable from the patient.  CHIEF COMPLAINT:  "I felt dizzy when I got up and I almost passed out."  HISTORY OF PRESENT ILLNESS:  The patient is a 60 year old very pleasant Caucasian female with history of coronary artery disease, status post cardiac cath plus stent and also had murmur, presenting to the emergency room with complaints of almost trying to pass out while she got up from a sitting position to a standing position.  The patient claims she had been in stable health until about late evening of January 31, 2011. She was sitting down and thereafter she get up to get some clothes from the dryer.  While attempting to do that, the patient felt dizzy and was almost about to pass out.  She held on tight to an object and hit the back of her head on the wall.  She however denied any history of passing out completely.  She denied any chest pain.  She denied any shortness of breath.  She claims she was nauseated, but denied any vomiting.  She also denied any fever, chills, or rigor.  The dizzy spell and headache was said to have persisted and also the feeling of trying to pass out and subsequently EMS was called and the patient was brought to the emergency room to be evaluated.  PAST MEDICAL HISTORY:  Positive for: 1. Hypertension. 2. Heart murmur. 3. Pancreatitis. 4. Coronary artery disease.  PAST SURGICAL HISTORY: 1. Coronary artery disease, status post cardiac cath plus stent. 2. Appendectomy. 3. Cholecystectomy. 4. Bladder tacking. 5. Lumbar diskectomy.  PREADMISSION  MEDICATIONS:  Include: 1. Pravastatin 80 mg p.o. daily. 2. Lisinopril 20 mg p.o. daily. 3. Omeprazole 20 mg p.o.  ALLERGIES:  MORPHINE and CODEINE.  SOCIAL HISTORY:  The patient smoked about a pack of cigarettes per day for over 40 years, quit smoking about a month ago.  Denies any history of alcohol use.  She worked different jobs in [employer].  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  FAMILY HISTORY:  Positive for stroke.  REVIEW OF SYSTEMS:  The patient complained about severe headache and nausea, but no vomiting.  Also complained of excessive thirst.  She denied any chest pain or shortness of breath.  No fever.  No chills.  No rigor.  Occasional cough that is nonproductive of sputum.  No abdominal discomfort.  No diarrhea or hematochezia.  No dysuria or hematuria. Periodic pain in her lower back.  No intolerance to heat or cold and no neuropsychiatric disorder.  PHYSICAL EXAMINATION:  GENERAL:  Very pleasant, slightly elderly lady dehydrated, not in any obvious respiratory distress. VITAL SIGNS:  Blood pressure on the right arm 104/48 and left arm 152/61; repeat, right arm 76/60 and left arm is 144/68. NECK:  No carotid bruits and no lymphadenopathy. CHEST:  Clear to auscultation. HEART:  S1 and S2 with soft systolic murmur at the left sternal border. ABDOMEN:  Soft, nontender.  Liver, spleen, kidneys not palpable.  Bowel sounds are positive. EXTREMITIES:  No pedal edema. NEUROLOGIC:  Nonfocal. MUSCULOSKELETAL SYSTEM:  Show arthritic changes in the knees. NEUROPSYCHIATRIC:  Unremarkable. SKIN:  Showed decreased turgor.  LABORATORY DATA:  Urinalysis unremarkable.  Hematological indices showed WBC of 11.7, hemoglobin of 14.1, hematocrit of 42.0, MCV of 90.9 with a platelet count of 273 normal differential.  Lipase is 41.  Chemistry showed sodium of 138, potassium of 4.0, chloride of 101 with a bicarb of 28, glucose is 98, BUN is 12,  creatinine 0.68.  LFTs are normal.  First set of cardiac enzymes, troponin I 0.01.  EKG done on the patient showed PVCs trigeminy with a rate of about 58 with variable rate ranging from 36 to 58.  IMPRESSION: 1. Presyncope. 2. Dehydration. 3. Differential blood pressure in the arms, questionable coarctation     of the aorta. 4. Coronary artery disease, status post cardiac cath plus stent. 5. Heart murmur. 6. History of hypertension.  PLAN:  To admit the patient to telemetry.  The patient will be slowly rehydrated with half-normal saline IV to go at a rate of 65 cc an hour. Headache will be controlled with Dilaudid 1 mg IV q.4 p.r.n. and Zofran 4 mg IV q.4 p.r.n. for nausea.  The patient will be on Protonix 40 mg IV q.24 for GI prophylaxis and TED stockings for DVT prophylaxis.Further workup to be done on this patient will include cardiac enzymes q.6 x3, carotid duplex, 2-D echo, and CT of the brain without contrast.  The patient will be evaluated on a day-to-day basis.     Talmage Nap, MD     CN/MEDQ  D:  02/01/2011  T:  02/01/2011  Job:  [id number]  Electronically Signed by Talmage Nap  on 02/01/2011 05:53:26 AM

## 2011-02-08 LAB — CK TOTAL AND CKMB (NOT AT ARMC)
CK, MB: 0.5
Total CK: 59
Total CK: 61

## 2011-02-08 LAB — POCT I-STAT CREATININE: Operator id: 257131

## 2011-02-08 LAB — I-STAT 8, (EC8 V) (CONVERTED LAB)
Acid-Base Excess: 3 — ABNORMAL HIGH
Chloride: 106
HCT: 49 — ABNORMAL HIGH
Potassium: 4.4
pH, Ven: 7.398 — ABNORMAL HIGH

## 2011-02-08 LAB — LIPID PANEL
HDL: 22 — ABNORMAL LOW
Triglycerides: 163 — ABNORMAL HIGH
VLDL: 33

## 2011-02-08 LAB — DIFFERENTIAL
Basophils Absolute: 0.1
Eosinophils Absolute: 0.2
Eosinophils Relative: 2
Lymphocytes Relative: 37
Monocytes Absolute: 1

## 2011-02-08 LAB — CBC
HCT: 44.8
Hemoglobin: 15.3 — ABNORMAL HIGH
MCHC: 34.2
MCV: 90.2
RDW: 13.6

## 2011-02-08 LAB — PROTIME-INR: INR: 1

## 2011-02-08 LAB — COMPREHENSIVE METABOLIC PANEL
ALT: 19
Albumin: 3.4 — ABNORMAL LOW
Alkaline Phosphatase: 53
Potassium: 4
Sodium: 143
Total Protein: 6.6

## 2011-02-08 LAB — APTT: aPTT: 30

## 2011-02-08 LAB — POCT CARDIAC MARKERS: Troponin i, poc: <0.05

## 2011-02-08 LAB — CARDIAC PANEL(CRET KIN+CKTOT+MB+TROPI): Troponin I: 0.01

## 2011-02-15 ENCOUNTER — Encounter: Payer: Medicare Other | Admitting: Internal Medicine

## 2011-02-28 NOTE — Discharge Summary (Signed)
[name], SIMKO NO.:  [id number]  MEDICAL RECORD NO.:  [id number]  LOCATION:                                 FACILITY:  PHYSICIAN:  Zannie Cove, MD     DATE OF BIRTH:  1951-04-08  DATE OF ADMISSION: DATE OF DISCHARGE:                              DISCHARGE SUMMARY   PRIMARY CARE PHYSICIAN:  Gloriajean Dell. Andrey Campanile, MD  CARDIOLOGIST:  Pricilla Riffle, MD, [facility]  DISCHARGE DIAGNOSES: 1. Presyncope, felt to be secondary to dehydration. 2. History of coronary artery disease, stable high-grade stenosis of     the ostium of the small to intermediate diagnosed branch based on     last cath in January 2006. 3. Differential blood pressure in both arms, ruled out for aortic     dissection or coarctation. 4. Multiple stable pulmonary nodules. 5. Chronic back pain. 6. Tobacco use. 7. Anxiety disorder. 8. Dyslipidemia. 9. Peripheral arterial disease. 10.Chronic obstructive pulmonary disease. 11.Fibromyalgia.  DISCHARGE MEDICATIONS: 1. Diazepam 5 mg p.o. t.i.d. p.r.n. 2. Vicodin 5/325 one tablet q.6 h. p.r.n. 3. Lisinopril 20 mg 1/2 tablet daily. 4. Sublingual nitroglycerin 1 tablet sublingual q.5 minutes up to 3     doses as needed. 5. Nicotine patch 21 mg for 24 hours, transdermal daily. 6. Omeprazole 20 mg daily. 7. Pravastatin 80 mg daily. The patient reports that Toprol-XL/metoprolol was stopped by one of her doctors.  DIAGNOSTICS/INVESTIGATIONS: 1. CT of the head, February 01, 2011, no acute intracranial     abnormality. 2. CT angio of the chest shows no evidence of acute aortic injury or     coarctation, no evidence of PE, right middle/lower lobe nodules,     measure 11 mm, unchanged from February 2012. 3. A 2-D echocardiogram, February 01, 2011, shows moderate LVH, EF is     60-65%, grade 1 diastolic dysfunction.  HOSPITAL COURSE:  [name] is a 60 year old female with multiple medical problems who presented to the hospital with chief  complaint of feeling dizzy when she stood up and feeling faint.  She felt like she was almost going to pass out, however, did not.  Subsequently admitted to be evaluated for presyncope. 1. Presyncope.  She was found to have positive orthostatics where her     systolic blood pressure dropped greater than 15 mmHg with going     from lying down to standing position which corrected with IV fluids     rehydration.  Rest of her workup was found to be unremarkable.  She     also ruled out for MI based on three sets of cardiac markers and     did not have any evidence of arrhythmias on telemetry.     Subsequently did not have any further instances of similar     complaints and is being discharged home in stable condition to     follow up with her primary physician and her cardiologist.  We can     consider restarting metoprolol since she does have history of CAD     if no other contraindication. 2. Differential blood pressure in both arms.  This was noted in the ER     as well  as subsequently on repeat blood pressure assessment 4 hours     later.  There was found to be a difference of greater than 40 mmHg     in her systolic blood pressure, hence, underwent a CT angio of the     chest to rule out aortic dissection or coarctation which was     unremarkable and hence, the patient is advised to follow up with     her PCP and cardiologist for this if this persists.  She could be     evaluated by vascular surgeon for possible vasculopathies. 3. Rest of her chronic medical problems remained stable.  Discharge     condition is stable.  VITAL SIGNS:  Temperature is 97.9, pulse 54, blood pressure 137/52, respirations 20, satting 96% on room air.  DISCHARGE FOLLOWUP:  Primary physician, Dr. Benedetto Goad, in 1 week and cardiologist, Dr. Dietrich Pates, in 1-2 months' time.     Zannie Cove, MD     PJ/MEDQ  D:  02/02/2011  T:  02/02/2011  Job:  [id number]  cc:   Gloriajean Dell. Andrey Campanile, M.D. Pricilla Riffle,  MD, [facility]  Electronically Signed by Zannie Cove  on 02/28/2011 02:20:11 PM

## 2011-03-01 ENCOUNTER — Encounter: Payer: Medicare Other | Admitting: Internal Medicine

## 2011-03-11 ENCOUNTER — Other Ambulatory Visit: Payer: Self-pay | Admitting: Internal Medicine

## 2011-08-12 ENCOUNTER — Emergency Department (INDEPENDENT_AMBULATORY_CARE_PROVIDER_SITE_OTHER): Payer: No Typology Code available for payment source

## 2011-08-12 ENCOUNTER — Emergency Department (HOSPITAL_BASED_OUTPATIENT_CLINIC_OR_DEPARTMENT_OTHER)
Admission: EM | Admit: 2011-08-12 | Discharge: 2011-08-12 | Disposition: A | Discharge status: Home / Self Care | Payer: No Typology Code available for payment source | Attending: Emergency Medicine | Admitting: Emergency Medicine

## 2011-08-12 ENCOUNTER — Encounter (HOSPITAL_BASED_OUTPATIENT_CLINIC_OR_DEPARTMENT_OTHER): Payer: Self-pay | Admitting: *Deleted

## 2011-08-12 DIAGNOSIS — M25519 Pain in unspecified shoulder: Secondary | ICD-10-CM

## 2011-08-12 DIAGNOSIS — S161XXA Strain of muscle, fascia and tendon at neck level, initial encounter: Secondary | ICD-10-CM

## 2011-08-12 DIAGNOSIS — S139XXA Sprain of joints and ligaments of unspecified parts of neck, initial encounter: Secondary | ICD-10-CM | POA: Insufficient documentation

## 2011-08-12 DIAGNOSIS — I10 Essential (primary) hypertension: Secondary | ICD-10-CM | POA: Insufficient documentation

## 2011-08-12 DIAGNOSIS — E041 Nontoxic single thyroid nodule: Secondary | ICD-10-CM

## 2011-08-12 DIAGNOSIS — M542 Cervicalgia: Secondary | ICD-10-CM

## 2011-08-12 HISTORY — DX: Essential (primary) hypertension: I10

## 2011-08-12 MED ORDER — ONDANSETRON 8 MG PO TBDP
8.0000 mg | ORAL_TABLET | Freq: Once | ORAL | Status: AC
  Administered 2011-08-12: 8 mg via ORAL
  Filled 2011-08-12: qty 1

## 2011-08-12 MED ORDER — HYDROMORPHONE HCL PF 1 MG/ML IJ SOLN
0.5000 mg | Freq: Once | INTRAMUSCULAR | Status: AC
  Administered 2011-08-12: 0.5 mg via INTRAMUSCULAR
  Filled 2011-08-12: qty 1

## 2011-08-12 MED ORDER — HYDROCODONE-ACETAMINOPHEN 5-325 MG PO TABS: 1.0000 | ORAL_TABLET | ORAL | Status: AC | PRN

## 2011-08-12 MED ORDER — DIAZEPAM 5 MG PO TABS: 5.0000 mg | ORAL_TABLET | Freq: Two times a day (BID) | ORAL | Status: AC
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
## 2011-11-19 ENCOUNTER — Telehealth: Payer: Self-pay | Admitting: Internal Medicine

## 2011-11-19 NOTE — Telephone Encounter (Signed)
Patient would like a return call from nurse, she can be reached at hm#

## 2011-11-19 NOTE — Telephone Encounter (Signed)
New msg Pt wants to talk to you about her diagnosis. She is filling out paperwork to get insurance. Please call

## 2011-11-19 NOTE — Telephone Encounter (Signed)
Called patient back. She confirmed appointment with Dr.Ross for 7/8.

## 2011-11-25 ENCOUNTER — Encounter: Payer: Medicare Other | Admitting: Internal Medicine

## 2011-11-25 ENCOUNTER — Encounter: Payer: Self-pay | Admitting: *Deleted

## 2011-11-27 ENCOUNTER — Telehealth: Payer: Self-pay | Admitting: Internal Medicine

## 2011-11-27 NOTE — Telephone Encounter (Signed)
New Problem:    Patient called in wanting to know if her nitro tablets were for anxiety attacks.  Patient would like a call back before 4:30pm.

## 2011-11-27 NOTE — Telephone Encounter (Signed)
Explained Nitro not used for anxiety attacks, its for chest pain. She was applying for life insurance and she was denied. Told her that nitro wasn't on her med list, she said she got it from her pcp/ Dr Andrey Campanile, asked her to call there then to question its use, pt agreed to plan.

## 2011-11-28 ENCOUNTER — Encounter: Payer: Self-pay | Admitting: Internal Medicine

## 2012-05-18 IMAGING — CT CT CHEST W/O CM
1 of 5 series · 4 of 36 positions shown, 5 images · non-contrast
Comparison: 06/23/2009

CLINICAL DATA: Follow-up nodules.

CT CHEST WITHOUT CONTRAST
TECHNIQUE: Multidetector CT imaging of the chest was performed
following the standard protocol without IV contrast.

[Series 602: <mpr thick range> · coronal · 0.69mm/px · 4 of 122 slices shown, 5 images]
[im 25/122  mediastinal]
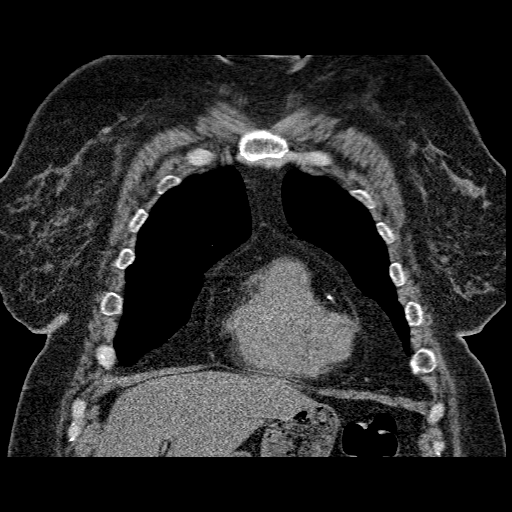
[im 25/122  lung]
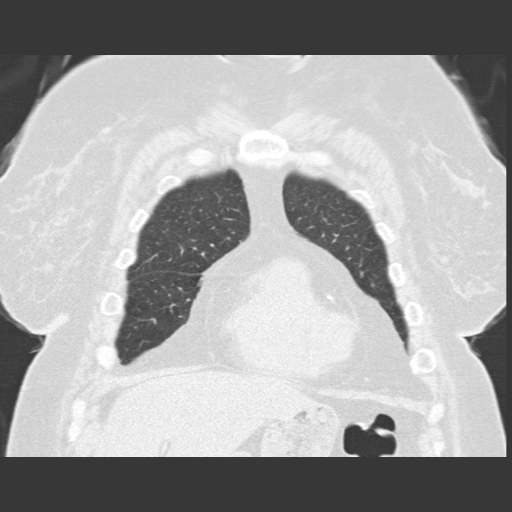
[im 49/122  lung]
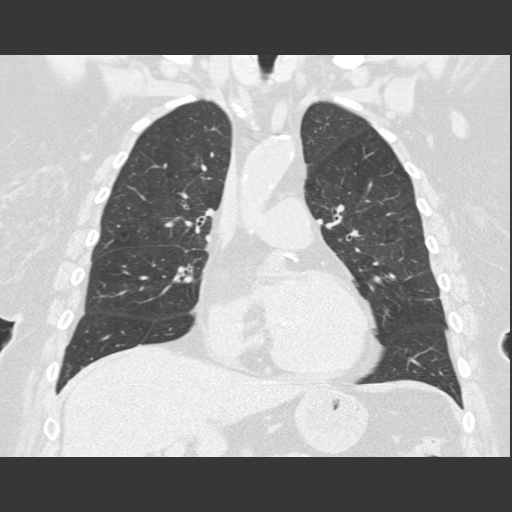
[im 73/122  lung]
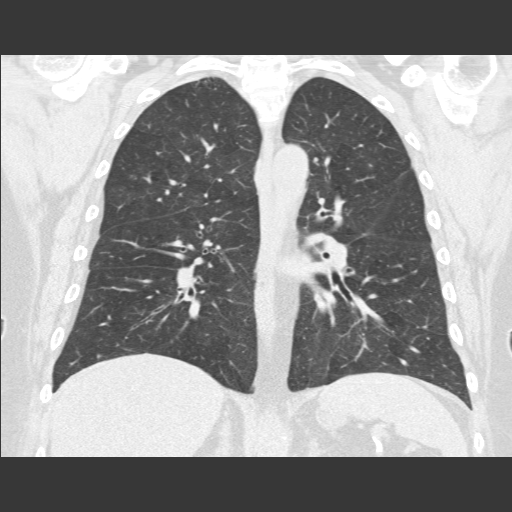
[im 97/122  lung]
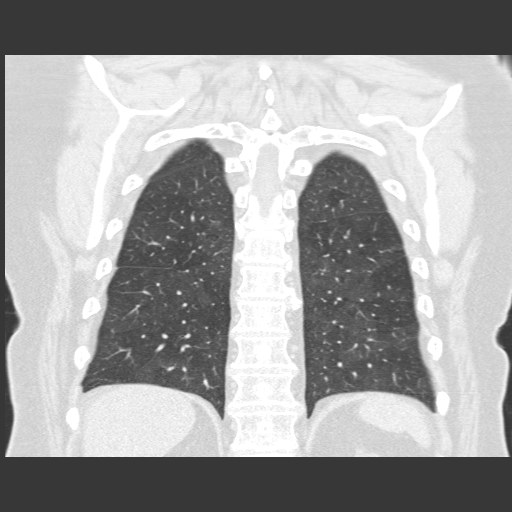

[4 of 36 positions shown; findings below may reference images not displayed]

FINDINGS: Heart is normal size. Aorta is normal caliber. Dense
coronary artery calcifications are present.

The ground-glass density in the right upper lobe has improved since
prior study, now difficult to measure but approximately 6 mm
compared to 10 mm previously.  Right middle lobe nodule measures 8
mm compared to 9 mm previously.  The anterior right lower lobe
nodule (image 35) measures 10 mm compared to 11 mm previously.
Medial right lower lobe nodule measures 7 mm compared to 7 mm
previously.

No new pulmonary nodules.  Mediastinal lymph nodes are less
prominent with none pathologically enlarged currently.  Normal
axillary lymph nodes are noted.

Chest wall soft tissues are unremarkable. Imaging into the upper
abdomen shows no acute findings.

High resolution images through the chest suggests some mild central
peribronchial thickening, but no other significant interstitial
lung disease.  Expiration views demonstrate mosaic perfusion
throughout the lungs suggesting some areas of air trapping.
IMPRESSION: The right upper lobe ground-glass nodule has improved since prior
study.  Solid right middle and right lower lobe nodules are stable.
Recommend continued surveillance with next study in 6-12 months.

Mild central peribronchial thickening with areas of air trapping on
expiration imaging.

## 2013-02-26 ENCOUNTER — Emergency Department (HOSPITAL_COMMUNITY)
Admission: EM | Admit: 2013-02-26 | Discharge: 2013-02-27 | Disposition: A | Discharge status: Home / Self Care | Payer: Medicare Other | Attending: Emergency Medicine | Admitting: Emergency Medicine

## 2013-02-26 ENCOUNTER — Emergency Department (HOSPITAL_COMMUNITY): Payer: Medicare Other

## 2013-02-26 ENCOUNTER — Encounter (HOSPITAL_COMMUNITY): Payer: Self-pay | Admitting: Emergency Medicine

## 2013-02-26 DIAGNOSIS — F17200 Nicotine dependence, unspecified, uncomplicated: Secondary | ICD-10-CM | POA: Insufficient documentation

## 2013-02-26 DIAGNOSIS — Z860100 Personal history of colon polyps, unspecified: Secondary | ICD-10-CM | POA: Insufficient documentation

## 2013-02-26 DIAGNOSIS — R209 Unspecified disturbances of skin sensation: Secondary | ICD-10-CM | POA: Insufficient documentation

## 2013-02-26 DIAGNOSIS — Z79899 Other long term (current) drug therapy: Secondary | ICD-10-CM | POA: Insufficient documentation

## 2013-02-26 DIAGNOSIS — Z9861 Coronary angioplasty status: Secondary | ICD-10-CM | POA: Insufficient documentation

## 2013-02-26 DIAGNOSIS — M79609 Pain in unspecified limb: Secondary | ICD-10-CM | POA: Insufficient documentation

## 2013-02-26 DIAGNOSIS — M25551 Pain in right hip: Secondary | ICD-10-CM

## 2013-02-26 DIAGNOSIS — Z9889 Other specified postprocedural states: Secondary | ICD-10-CM | POA: Insufficient documentation

## 2013-02-26 DIAGNOSIS — I10 Essential (primary) hypertension: Secondary | ICD-10-CM | POA: Insufficient documentation

## 2013-02-26 DIAGNOSIS — K219 Gastro-esophageal reflux disease without esophagitis: Secondary | ICD-10-CM | POA: Insufficient documentation

## 2013-02-26 DIAGNOSIS — M25511 Pain in right shoulder: Secondary | ICD-10-CM

## 2013-02-26 DIAGNOSIS — E785 Hyperlipidemia, unspecified: Secondary | ICD-10-CM | POA: Insufficient documentation

## 2013-02-26 DIAGNOSIS — I2510 Atherosclerotic heart disease of native coronary artery without angina pectoris: Secondary | ICD-10-CM | POA: Insufficient documentation

## 2013-02-26 DIAGNOSIS — G8929 Other chronic pain: Secondary | ICD-10-CM | POA: Insufficient documentation

## 2013-02-26 DIAGNOSIS — M25559 Pain in unspecified hip: Secondary | ICD-10-CM | POA: Insufficient documentation

## 2013-02-26 DIAGNOSIS — Z7982 Long term (current) use of aspirin: Secondary | ICD-10-CM | POA: Insufficient documentation

## 2013-02-26 DIAGNOSIS — M5380 Other specified dorsopathies, site unspecified: Secondary | ICD-10-CM | POA: Insufficient documentation

## 2013-02-26 DIAGNOSIS — M25519 Pain in unspecified shoulder: Secondary | ICD-10-CM | POA: Insufficient documentation

## 2013-02-26 DIAGNOSIS — M549 Dorsalgia, unspecified: Secondary | ICD-10-CM | POA: Insufficient documentation

## 2013-02-26 LAB — URINALYSIS, ROUTINE W REFLEX MICROSCOPIC
Bilirubin Urine: NEGATIVE
Glucose, UA: NEGATIVE mg/dL
Ketones, ur: NEGATIVE mg/dL
Leukocytes, UA: NEGATIVE
pH: 5.5 (ref 5.0–8.0)

## 2013-02-26 LAB — RAPID URINE DRUG SCREEN, HOSP PERFORMED
Amphetamines: NOT DETECTED
Barbiturates: NOT DETECTED
Benzodiazepines: POSITIVE — AB

## 2013-02-26 MED ORDER — HYDROCODONE-ACETAMINOPHEN 5-325 MG PO TABS
1.0000 | ORAL_TABLET | Freq: Once | ORAL | Status: AC
  Administered 2013-02-26: 1 via ORAL
  Filled 2013-02-26: qty 1

## 2013-02-26 NOTE — ED Notes (Signed)
Pt here via ems for c/o bilat leg pain and rt shoulder pain x3 days hx of shoulder Sx

## 2013-02-26 NOTE — ED Provider Notes (Signed)
CSN: [id number]     Arrival date & time 02/26/13  2147 History   First MD Initiated Contact with Patient 02/26/13 2154     Chief Complaint  Patient presents with  . Leg Pain  . Shoulder Pain   (Consider location/radiation/quality/duration/timing/severity/associated sxs/prior Treatment) HPI Comments: Patient here with bilateral hip and upper leg pain and right shoulder pain.  She reports that she has been to see her back doctor for both of these issues and he did not think they were related to her previous back surgery.  She was referred to an orthopedist who saw her and thought her right shoulder was "frozen" - she reports that she was given two steroid injections into the shoulder and this initially helped but now the pain has returned.  Upon further discussion, she reports that the pain in both areas have been going on for several months.  She reports bilateral hip pain with right greater than left.  She states that she gets a burning sensation to the medial portion of her right knee when she sits on the toilet.  She is able to ambulate but reports that the pain is making her limp.  She reports no increase in her back pain with this.  She denies fever, chills.  She has taken vicodin in the past but did not take any today, she states that she needs to refill her prescription.  She denies loss of control of bowels or bladder, numbness or tingling, weakness worse than normal.  Patient is a 62 y.o. female presenting with leg pain and shoulder pain. The history is provided by the patient. No language interpreter was used.  Leg Pain Location:  Hip and leg Time since incident:  7 days Injury: no   Hip location:  R hip and L hip Leg location:  L upper leg and R upper leg Pain details:    Quality:  Burning and shooting   Radiates to:  Does not radiate   Severity:  Severe   Onset quality:  Gradual   Timing:  Constant   Progression:  Worsening Chronicity:  Recurrent Dislocation: no   Foreign body  present:  No foreign bodies Tetanus status:  Unknown Prior injury to area:  No Relieved by:  Nothing Worsened by:  Nothing tried Ineffective treatments:  None tried Associated symptoms: back pain and stiffness   Associated symptoms: no decreased ROM, no fatigue, no muscle weakness, no neck pain, no numbness, no swelling and no tingling   Shoulder Pain Pertinent negatives include no fatigue or neck pain.    Past Medical History  Diagnosis Date  . Hypertension   . Stented coronary artery   . CAD (coronary artery disease)   . HTN (hypertension)   . HLD (hyperlipidemia)   . Colon polyps   . Esophageal reflux    Past Surgical History  Procedure Laterality Date  . Appendectomy    . Tumor removal      ovarian  . Back surgery    . Cardiac catheterization  2006   No family history on file. History  Substance Use Topics  . Smoking status: Current Every Day Smoker    Types: Cigarettes  . Smokeless tobacco: Not on file  . Alcohol Use: No   OB History   Grav Para Term Preterm Abortions TAB SAB Ect Mult Living                 Review of Systems  Constitutional: Negative for fatigue.  Genitourinary: Negative for  difficulty urinating and pelvic pain.  Musculoskeletal: Positive for back pain and stiffness. Negative for neck pain.  All other systems reviewed and are negative.    Allergies  Codeine and Morphine  Home Medications   Current Outpatient Rx  Name  Route  Sig  Dispense  Refill  . aspirin 81 MG tablet   Oral   Take 81 mg by mouth daily.         Marland Kitchen HYDROcodone-acetaminophen (NORCO) 10-325 MG per tablet   Oral   Take 1 tablet by mouth every 4 (four) hours as needed for pain.          Marland Kitchen lisinopril (PRINIVIL,ZESTRIL) 10 MG tablet   Oral   Take 10 mg by mouth daily.         Marland Kitchen losartan (COZAAR) 25 MG tablet   Oral   Take 25 mg by mouth daily.         . metoprolol (LOPRESSOR) 50 MG tablet   Oral   Take 50 mg by mouth daily.          Marland Kitchen omeprazole  (PRILOSEC) 40 MG capsule   Oral   Take 40 mg by mouth daily.         . pravastatin (PRAVACHOL) 10 MG tablet   Oral   Take 10 mg by mouth daily.          BP 127/71  Pulse 80  Temp(Src) 98.5 F (36.9 C)  Resp 18  Ht 5\' 5"  (1.651 m)  Wt 167 lb (75.751 kg)  BMI 27.79 kg/m2  SpO2 100% Physical Exam  Nursing note and vitals reviewed. Constitutional: She is oriented to person, place, and time. She appears well-developed and well-nourished. No distress.  HENT:  Head: Normocephalic and atraumatic.  Right Ear: External ear normal.  Left Ear: External ear normal.  Nose: Nose normal.  Mouth/Throat: Oropharynx is clear and moist. No oropharyngeal exudate.  Eyes: Conjunctivae are normal. Pupils are equal, round, and reactive to light. No scleral icterus.  Neck: Normal range of motion. Neck supple.  Cardiovascular: Normal rate, regular rhythm and normal heart sounds.  Exam reveals no gallop and no friction rub.   No murmur heard. Pulmonary/Chest: Effort normal and breath sounds normal. No respiratory distress.  Abdominal: Soft. Bowel sounds are normal. She exhibits no distension. There is no tenderness.  Musculoskeletal:       Right shoulder: She exhibits decreased range of motion, tenderness and bony tenderness. She exhibits normal pulse.       Right hip: She exhibits tenderness and bony tenderness. She exhibits normal range of motion and normal strength.       Left hip: She exhibits tenderness and bony tenderness. She exhibits normal range of motion and normal strength.       Thoracic back: She exhibits normal range of motion, no tenderness and no bony tenderness.       Lumbar back: She exhibits normal range of motion, no tenderness and no bony tenderness.       Arms:      Legs: Lymphadenopathy:    She has no cervical adenopathy.  Neurological: She is alert and oriented to person, place, and time. She exhibits normal muscle tone. Coordination normal.  Skin: Skin is warm and dry. No  rash noted. No erythema. No pallor.  Psychiatric: She has a normal mood and affect. Her behavior is normal. Judgment and thought content normal.    ED Course  Procedures (including critical care time) Labs Review Labs Reviewed  URINALYSIS, ROUTINE W REFLEX MICROSCOPIC  URINE RAPID DRUG SCREEN (HOSP PERFORMED)   Imaging Review No results found.  EKG Interpretation   None      Results for orders placed during the hospital encounter of 02/26/13  URINALYSIS, ROUTINE W REFLEX MICROSCOPIC      Result Value Range   Color, Urine YELLOW  YELLOW   APPearance CLEAR  CLEAR   Specific Gravity, Urine 1.016  1.005 - 1.030   pH 5.5  5.0 - 8.0   Glucose, UA NEGATIVE  NEGATIVE mg/dL   Hgb urine dipstick NEGATIVE  NEGATIVE   Bilirubin Urine NEGATIVE  NEGATIVE   Ketones, ur NEGATIVE  NEGATIVE mg/dL   Protein, ur NEGATIVE  NEGATIVE mg/dL   Urobilinogen, UA 0.2  0.0 - 1.0 mg/dL   Nitrite NEGATIVE  NEGATIVE   Leukocytes, UA NEGATIVE  NEGATIVE  URINE RAPID DRUG SCREEN (HOSP PERFORMED)      Result Value Range   Opiates NONE DETECTED  NONE DETECTED   Cocaine NONE DETECTED  NONE DETECTED   Benzodiazepines POSITIVE (*) NONE DETECTED   Amphetamines NONE DETECTED  NONE DETECTED   Tetrahydrocannabinol NONE DETECTED  NONE DETECTED   Barbiturates NONE DETECTED  NONE DETECTED   Dg Hip Bilateral W/pelvis  02/26/2013   CLINICAL DATA:  Bilateral hip and leg pain, greater on the right. No reported injury.  EXAM: BILATERAL HIP WITH PELVIS - 4+ VIEW  COMPARISON:  Pelvis CT dated 11/2010.  FINDINGS: Normal appearing hips. Atheromatous arterial calcifications. Lower lumbar spine degenerative changes.  IMPRESSION: 1. Normal appearing hips. 2. Lower lumbar spine degenerative changes. 3. Atheromatous arterial calcifications.   Electronically Signed   By: Gordan Payment M.D.   On: 02/26/2013 22:56      MDM  Bilateral hip pain Right shoulder pain  Patient with a history of chronic back pain after surgery  presents with a month history of bilateral hip pain and right shoulder pain.  Is being followed by ortho for the shoulder so I do not think any further imaging is warranted there.  Pelvis and hip plain films show degenerative changes in the lower spine, but nothing in the hips.  Patient is ambulatory so doubt occult fracture.  Has slept the entire time.  She is encouraged to fill her norco rx and take this for the pain.  She agrees with the plan.    Izola Price Marisue Humble, PA-C 02/26/13 2351

## 2013-02-26 NOTE — ED Notes (Signed)
Bed: WA09 Expected date:  Expected time:  Means of arrival:  Comments: EMS 62yo F, arm and leg pain

## 2013-02-27 NOTE — ED Provider Notes (Signed)
Medical screening examination/treatment/procedure(s) were performed by non-physician practitioner and as supervising physician I was immediately available for consultation/collaboration.  Flint Melter, MD 02/27/13 1537

## 2014-03-04 ENCOUNTER — Encounter (HOSPITAL_COMMUNITY): Payer: Self-pay | Admitting: Emergency Medicine

## 2014-03-04 ENCOUNTER — Emergency Department (HOSPITAL_COMMUNITY)
Admission: EM | Admit: 2014-03-04 | Discharge: 2014-03-04 | Disposition: A | Discharge status: Home / Self Care | Payer: Medicare HMO | Attending: Emergency Medicine | Admitting: Emergency Medicine

## 2014-03-04 ENCOUNTER — Emergency Department (HOSPITAL_COMMUNITY): Payer: Medicare HMO

## 2014-03-04 DIAGNOSIS — R42 Dizziness and giddiness: Secondary | ICD-10-CM | POA: Diagnosis not present

## 2014-03-04 DIAGNOSIS — Z86010 Personal history of colonic polyps: Secondary | ICD-10-CM | POA: Insufficient documentation

## 2014-03-04 DIAGNOSIS — I2510 Atherosclerotic heart disease of native coronary artery without angina pectoris: Secondary | ICD-10-CM | POA: Diagnosis not present

## 2014-03-04 DIAGNOSIS — R55 Syncope and collapse: Secondary | ICD-10-CM | POA: Diagnosis present

## 2014-03-04 DIAGNOSIS — E785 Hyperlipidemia, unspecified: Secondary | ICD-10-CM | POA: Diagnosis not present

## 2014-03-04 DIAGNOSIS — Z9889 Other specified postprocedural states: Secondary | ICD-10-CM | POA: Insufficient documentation

## 2014-03-04 DIAGNOSIS — Z7982 Long term (current) use of aspirin: Secondary | ICD-10-CM | POA: Diagnosis not present

## 2014-03-04 DIAGNOSIS — Z720 Tobacco use: Secondary | ICD-10-CM | POA: Insufficient documentation

## 2014-03-04 DIAGNOSIS — K219 Gastro-esophageal reflux disease without esophagitis: Secondary | ICD-10-CM | POA: Diagnosis not present

## 2014-03-04 DIAGNOSIS — Z79899 Other long term (current) drug therapy: Secondary | ICD-10-CM | POA: Insufficient documentation

## 2014-03-04 DIAGNOSIS — I10 Essential (primary) hypertension: Secondary | ICD-10-CM | POA: Diagnosis not present

## 2014-03-04 LAB — URINE MICROSCOPIC-ADD ON: RBC / HPF: NONE SEEN RBC/hpf (ref <3)

## 2014-03-04 LAB — BASIC METABOLIC PANEL
Anion gap: 12 (ref 5–15)
BUN: 8 mg/dL (ref 6–23)
CHLORIDE: 104 meq/L (ref 96–112)
CO2: 24 mEq/L (ref 19–32)
CREATININE: 0.62 mg/dL (ref 0.50–1.10)
Calcium: 9.1 mg/dL (ref 8.4–10.5)
GFR calc Af Amer: >90 mL/min (ref >90)
GFR calc non Af Amer: >90 mL/min (ref >90)
GLUCOSE: 104 mg/dL — AB (ref 70–99)
POTASSIUM: 4.2 meq/L (ref 3.7–5.3)
Sodium: 140 mEq/L (ref 137–147)

## 2014-03-04 LAB — URINALYSIS, ROUTINE W REFLEX MICROSCOPIC
BILIRUBIN URINE: NEGATIVE
GLUCOSE, UA: NEGATIVE mg/dL
Ketones, ur: NEGATIVE mg/dL
Leukocytes, UA: NEGATIVE
Nitrite: NEGATIVE
PH: 6.5 (ref 5.0–8.0)
Protein, ur: NEGATIVE mg/dL
SPECIFIC GRAVITY, URINE: 1.007 (ref 1.005–1.030)
Urobilinogen, UA: 0.2 mg/dL (ref 0.0–1.0)

## 2014-03-04 LAB — CBC WITH DIFFERENTIAL/PLATELET
Basophils Absolute: 0.1 10*3/uL (ref 0.0–0.1)
Basophils Relative: 1 % (ref 0–1)
Eosinophils Absolute: 0.1 10*3/uL (ref 0.0–0.7)
Eosinophils Relative: 1 % (ref 0–5)
HEMATOCRIT: 34.2 % — AB (ref 36.0–46.0)
HEMOGLOBIN: 10.6 g/dL — AB (ref 12.0–15.0)
LYMPHS ABS: 2.1 10*3/uL (ref 0.7–4.0)
Lymphocytes Relative: 25 % (ref 12–46)
MCH: 24.5 pg — AB (ref 26.0–34.0)
MCHC: 31 g/dL (ref 30.0–36.0)
MCV: 79.2 fL (ref 78.0–100.0)
MONO ABS: 0.7 10*3/uL (ref 0.1–1.0)
MONOS PCT: 9 % (ref 3–12)
NEUTROS ABS: 5.5 10*3/uL (ref 1.7–7.7)
NEUTROS PCT: 66 % (ref 43–77)
Platelets: 346 10*3/uL (ref 150–400)
RBC: 4.32 MIL/uL (ref 3.87–5.11)
RDW: 14.8 % (ref 11.5–15.5)
WBC: 8.4 10*3/uL (ref 4.0–10.5)

## 2014-03-04 LAB — CBG MONITORING, ED: GLUCOSE-CAPILLARY: 108 mg/dL — AB (ref 70–99)

## 2014-03-04 LAB — TROPONIN I

## 2014-03-04 NOTE — ED Provider Notes (Signed)
CSN: [id number]     Arrival date & time 03/04/14  0900 History   First MD Initiated Contact with Patient 03/04/14 0913     Chief Complaint  Patient presents with  . Near Syncope     (Consider location/radiation/quality/duration/timing/severity/associated sxs/prior Treatment) The history is provided by the patient, medical records and a relative.    This is a 63 y.o. F with PMH significant for HTN, HLD, GERD, CAD s/p stenting, presenting to the ED for near syncope.  Patient states this morning upon waking, she walked to the kitchen and got very lightheaded.  States she immediately went and sat down and called 911.  Patient states this has been happening frequently over the past 3 weeks, mostly upon first standing up out of bed in the morning.  She denies dizziness, tinnitus, visual disturbance, headache, numbness, paresthesias, or weakness of extremities. She denies any  Chest pain, shortness of breath, palpitations. She does note decreased PO intake but denies nausea, vomiting, diarrhea, or abdominal pain.   Denies recent changes in medications. Patient is followed regularly by cardiology, Dr. Dietrich PatesPaula Ross, and is scheduled for follow-up with her next week.  Past Medical History  Diagnosis Date  . Hypertension   . Stented coronary artery   . CAD (coronary artery disease)   . HTN (hypertension)   . HLD (hyperlipidemia)   . Colon polyps   . Esophageal reflux    Past Surgical History  Procedure Laterality Date  . Appendectomy    . Tumor removal      ovarian  . Back surgery    . Cardiac catheterization  2006   History reviewed. No pertinent family history. History  Substance Use Topics  . Smoking status: Current Every Day Smoker    Types: Cigarettes  . Smokeless tobacco: Not on file  . Alcohol Use: No   OB History   Grav Para Term Preterm Abortions TAB SAB Ect Mult Living                 Review of Systems  Neurological: Positive for light-headedness.  All other systems  reviewed and are negative.     Allergies  Codeine and Morphine  Home Medications   Prior to Admission medications   Medication Sig Start Date End Date Taking? Authorizing Provider  aspirin 81 MG tablet Take 81 mg by mouth daily.    Historical Provider, MD  HYDROcodone-acetaminophen (NORCO) 10-325 MG per tablet Take 1 tablet by mouth every 4 (four) hours as needed for pain.  10/30/11   Historical Provider, MD  lisinopril (PRINIVIL,ZESTRIL) 10 MG tablet Take 10 mg by mouth daily.    Historical Provider, MD  losartan (COZAAR) 25 MG tablet Take 25 mg by mouth daily.    Historical Provider, MD  metoprolol (LOPRESSOR) 50 MG tablet Take 50 mg by mouth daily.     Historical Provider, MD  omeprazole (PRILOSEC) 40 MG capsule Take 40 mg by mouth daily.    Historical Provider, MD  pravastatin (PRAVACHOL) 10 MG tablet Take 10 mg by mouth daily.    Historical Provider, MD   BP 148/45  Pulse 69  Temp(Src) 98.5 F (36.9 C) (Oral)  Resp 24  Ht 5\' 5"  (1.651 m)  Wt 167 lb (75.751 kg)  BMI 27.79 kg/m2  SpO2 98%  Physical Exam  Nursing note and vitals reviewed. Constitutional: She is oriented to person, place, and time. She appears well-developed and well-nourished.  HENT:  Head: Normocephalic and atraumatic.  Mouth/Throat: Oropharynx is clear  and moist.  Eyes: Conjunctivae, EOM and lids are normal. Pupils are equal, round, and reactive to light.  EOM intact and non-painful; normal confrontation; no visual field cuts  Neck: Normal range of motion.  Cardiovascular: Normal rate, regular rhythm and normal heart sounds.   Pulmonary/Chest: Effort normal and breath sounds normal.  Abdominal: Soft. Bowel sounds are normal.  Musculoskeletal: Normal range of motion.  Neurological: She is alert and oriented to person, place, and time.  AAOx3, answering questions appropriately; equal strength UE and LE bilaterally; CN grossly intact; moves all extremities appropriately without ataxia; no focal neuro  deficits or facial asymmetry appreciated  Skin: Skin is warm and dry.  Psychiatric: She has a normal mood and affect.    ED Course  Procedures (including critical care time) Labs Review Labs Reviewed  CBC WITH DIFFERENTIAL - Abnormal; Notable for the following:    Hemoglobin 10.6 (*)    HCT 34.2 (*)    MCH 24.5 (*)    All other components within normal limits  BASIC METABOLIC PANEL - Abnormal; Notable for the following:    Glucose, Bld 104 (*)    All other components within normal limits  URINALYSIS, ROUTINE W REFLEX MICROSCOPIC - Abnormal; Notable for the following:    Hgb urine dipstick SMALL (*)    All other components within normal limits  URINE MICROSCOPIC-ADD ON - Abnormal; Notable for the following:    Bacteria, UA FEW (*)    All other components within normal limits  CBG MONITORING, ED - Abnormal; Notable for the following:    Glucose-Capillary 108 (*)    All other components within normal limits  TROPONIN I    Imaging Review Dg Chest 2 View  03/04/2014   CLINICAL DATA:  Morning dizziness for 3 days. Hypertension. Prior cardiac stent placement.  EXAM: CHEST  2 VIEW  COMPARISON:  03/17/2010.  FINDINGS: Heart is mildly enlarged. Left lung is clear. Small nodule projects over the right lower lung, corresponding to previously seen right middle lobe nodule by CT. This was shown in 2012 to be stable and does not appear significantly changed by plain film.  No pleural effusion.  No acute bony abnormality.  IMPRESSION: Mild cardiomegaly.  No active disease.  Stable appearance of a right middle lobe nodule by plain film.   Electronically Signed   By: Charlett NoseKevin  Dover M.D.   On: 03/04/2014 09:56     EKG Interpretation   Date/Time:  Friday March 04 2014 09:10:31 EDT Ventricular Rate:  65 PR Interval:  159 QRS Duration: 94 QT Interval:  411 QTC Calculation: 427 R Axis:   67 Text Interpretation:  Sinus rhythm Low voltage, precordial leads  Nonspecific T abnrm, anterolateral  leads No significant change since last  tracing Confirmed by YAO  MD, DAVID ([id number]) on 03/04/2014 9:41:12 AM      MDM   Final diagnoses:  Episodic lightheadedness   63 year old female with episodic lightheadedness upon standing intermittently over the past 3 weeks. On arrival, patient denies any current dizziness, lightheadedness, numbness, paresthesias, or feelings of syncope.   neurologic exam without focal deficit.  EKG sinus rhythm, nonspecific T abnormalities unchanged from previous. Troponin negative. Chest x-ray with unchanged RML nodule, no acute findings.  Lab work is reassuring.  Orthostatic VS appropriate.  Patient has remained asymptomatic while in the ED, neurologic exam remains non-focal.  No evidence to suggest emergent condition warranting admission at this time.  Patient will be discharged home with close cardiology FU next week  as previously scheduled.  Discussed plan with patient, he/she acknowledged understanding and agreed with plan of care.  Return precautions given for new or worsening symptoms.  Case discussed with attending physician, Dr. Silverio Lay, who personally evaluated patient and agrees with plan of care.  Garlon Hatchet, PA-C 03/04/14 1341

## 2014-03-04 NOTE — Discharge Instructions (Signed)
Continue all regular medications. Follow-up with Dr. Tenny Crawoss next week as previously scheduled. Return to the ED for new or worsening symptoms-- recurrent lightheadedness, dizziness, falls, difficulty walking, numbness, tingling, etc.

## 2014-03-04 NOTE — ED Notes (Signed)
Pt reports that she was standing in the kitchen this morning and became light headed and sat down and called 911.  Pt has had several episodes of light headedness over the past two days but has not completely lost consciousness.  Pt denies any n/v/d, chest pain, diaphoresis or SHOB.

## 2014-03-04 NOTE — ED Notes (Signed)
Pt called to nurse's station for pain at IV site.  RN assessed IV site which had no redness, erythema, swelling or drainage.  Catheter was repositioned and taped.  Pt reports relief.  Call bell within reach, pt in NAD and no further needs identified at this time.

## 2014-03-04 NOTE — ED Notes (Signed)
CBG 108. 

## 2014-03-05 NOTE — ED Provider Notes (Signed)
Medical screening examination/treatment/procedure(s) were conducted as a shared visit with non-physician practitioner(s) and myself.  I personally evaluated the patient during the encounter.   EKG Interpretation   Date/Time:  Friday March 04 2014 09:10:31 EDT Ventricular Rate:  65 PR Interval:  159 QRS Duration: 94 QT Interval:  411 QTC Calculation: 427 R Axis:   67 Text Interpretation:  Sinus rhythm Low voltage, precordial leads  Nonspecific T abnrm, anterolateral leads No significant change since last  tracing Confirmed by YAO  MD, DAVID ([id number]) on 03/04/2014 9:41:12 AM      [name] is a 63 y.o. female hx of HTN, HL, GERD, CAD s/p stent here with near syncope. Has been feeling light headed for several weeks. Today, was in the kitchen and felt light headed. Denies actual syncope. Well appearing on exam. Not orthostatic. Heart, lung abdomen unremarkable. Labs unremarkable. CXR showed stable lung nodule. Will d/c home.    Richardean Canalavid H Yao, MD 03/05/14 0700

## 2015-10-22 ENCOUNTER — Emergency Department (HOSPITAL_COMMUNITY): Payer: Medicare Other

## 2015-10-22 ENCOUNTER — Observation Stay (HOSPITAL_COMMUNITY)
Admission: EM | Admit: 2015-10-22 | Discharge: 2015-10-26 | Disposition: A | Discharge status: Home / Self Care | Payer: Medicare Other | Attending: Internal Medicine | Admitting: Internal Medicine

## 2015-10-22 ENCOUNTER — Encounter (HOSPITAL_COMMUNITY): Payer: Self-pay | Admitting: Emergency Medicine

## 2015-10-22 DIAGNOSIS — I10 Essential (primary) hypertension: Secondary | ICD-10-CM | POA: Insufficient documentation

## 2015-10-22 DIAGNOSIS — E079 Disorder of thyroid, unspecified: Secondary | ICD-10-CM | POA: Insufficient documentation

## 2015-10-22 DIAGNOSIS — J449 Chronic obstructive pulmonary disease, unspecified: Secondary | ICD-10-CM | POA: Diagnosis not present

## 2015-10-22 DIAGNOSIS — D72829 Elevated white blood cell count, unspecified: Secondary | ICD-10-CM | POA: Insufficient documentation

## 2015-10-22 DIAGNOSIS — S0990XA Unspecified injury of head, initial encounter: Secondary | ICD-10-CM

## 2015-10-22 DIAGNOSIS — R079 Chest pain, unspecified: Secondary | ICD-10-CM

## 2015-10-22 DIAGNOSIS — F329 Major depressive disorder, single episode, unspecified: Secondary | ICD-10-CM | POA: Diagnosis not present

## 2015-10-22 DIAGNOSIS — E669 Obesity, unspecified: Secondary | ICD-10-CM | POA: Diagnosis not present

## 2015-10-22 DIAGNOSIS — R569 Unspecified convulsions: Secondary | ICD-10-CM | POA: Insufficient documentation

## 2015-10-22 DIAGNOSIS — D649 Anemia, unspecified: Secondary | ICD-10-CM | POA: Diagnosis not present

## 2015-10-22 DIAGNOSIS — S0003XA Contusion of scalp, initial encounter: Secondary | ICD-10-CM | POA: Diagnosis not present

## 2015-10-22 DIAGNOSIS — K219 Gastro-esophageal reflux disease without esophagitis: Secondary | ICD-10-CM | POA: Diagnosis present

## 2015-10-22 DIAGNOSIS — R911 Solitary pulmonary nodule: Secondary | ICD-10-CM

## 2015-10-22 DIAGNOSIS — R0989 Other specified symptoms and signs involving the circulatory and respiratory systems: Secondary | ICD-10-CM

## 2015-10-22 DIAGNOSIS — I2510 Atherosclerotic heart disease of native coronary artery without angina pectoris: Secondary | ICD-10-CM | POA: Insufficient documentation

## 2015-10-22 DIAGNOSIS — Z955 Presence of coronary angioplasty implant and graft: Secondary | ICD-10-CM | POA: Diagnosis not present

## 2015-10-22 DIAGNOSIS — R7989 Other specified abnormal findings of blood chemistry: Secondary | ICD-10-CM | POA: Diagnosis present

## 2015-10-22 DIAGNOSIS — Z7982 Long term (current) use of aspirin: Secondary | ICD-10-CM | POA: Diagnosis not present

## 2015-10-22 DIAGNOSIS — F17210 Nicotine dependence, cigarettes, uncomplicated: Secondary | ICD-10-CM | POA: Diagnosis not present

## 2015-10-22 DIAGNOSIS — I959 Hypotension, unspecified: Secondary | ICD-10-CM | POA: Diagnosis present

## 2015-10-22 DIAGNOSIS — E785 Hyperlipidemia, unspecified: Secondary | ICD-10-CM | POA: Insufficient documentation

## 2015-10-22 DIAGNOSIS — F419 Anxiety disorder, unspecified: Secondary | ICD-10-CM | POA: Diagnosis not present

## 2015-10-22 DIAGNOSIS — I6523 Occlusion and stenosis of bilateral carotid arteries: Secondary | ICD-10-CM | POA: Insufficient documentation

## 2015-10-22 DIAGNOSIS — R55 Syncope and collapse: Principal | ICD-10-CM | POA: Insufficient documentation

## 2015-10-22 DIAGNOSIS — F1290 Cannabis use, unspecified, uncomplicated: Secondary | ICD-10-CM | POA: Diagnosis present

## 2015-10-22 DIAGNOSIS — F17200 Nicotine dependence, unspecified, uncomplicated: Secondary | ICD-10-CM | POA: Diagnosis present

## 2015-10-22 DIAGNOSIS — F32A Depression, unspecified: Secondary | ICD-10-CM | POA: Diagnosis present

## 2015-10-22 DIAGNOSIS — R52 Pain, unspecified: Secondary | ICD-10-CM

## 2015-10-22 LAB — COMPREHENSIVE METABOLIC PANEL
ALK PHOS: 56 U/L (ref 38–126)
ALT: 16 U/L (ref 14–54)
AST: 21 U/L (ref 15–41)
Albumin: 3.5 g/dL (ref 3.5–5.0)
Anion gap: 4 — ABNORMAL LOW (ref 5–15)
BUN: 10 mg/dL (ref 6–20)
CALCIUM: 8.4 mg/dL — AB (ref 8.9–10.3)
CO2: 27 mmol/L (ref 22–32)
Chloride: 108 mmol/L (ref 101–111)
Creatinine, Ser: 0.7 mg/dL (ref 0.44–1.00)
GFR calc non Af Amer: >60 mL/min (ref >60)
Glucose, Bld: 106 mg/dL — ABNORMAL HIGH (ref 65–99)
Potassium: 3.8 mmol/L (ref 3.5–5.1)
Sodium: 139 mmol/L (ref 135–145)
Total Bilirubin: 0.3 mg/dL (ref 0.3–1.2)
Total Protein: 6.6 g/dL (ref 6.5–8.1)

## 2015-10-22 LAB — CBC WITH DIFFERENTIAL/PLATELET
Basophils Absolute: 0 10*3/uL (ref 0.0–0.1)
Basophils Relative: 0 %
EOS ABS: 0.1 10*3/uL (ref 0.0–0.7)
EOS PCT: 0 %
HCT: 37.9 % (ref 36.0–46.0)
HEMOGLOBIN: 11.9 g/dL — AB (ref 12.0–15.0)
LYMPHS ABS: 1.4 10*3/uL (ref 0.7–4.0)
Lymphocytes Relative: 6 %
MCH: 27.5 pg (ref 26.0–34.0)
MCHC: 31.4 g/dL (ref 30.0–36.0)
MCV: 87.5 fL (ref 78.0–100.0)
MONO ABS: 1.6 10*3/uL — AB (ref 0.1–1.0)
Monocytes Relative: 7 %
NEUTROS PCT: 87 %
Neutro Abs: 19.3 10*3/uL — ABNORMAL HIGH (ref 1.7–7.7)
PLATELETS: 297 10*3/uL (ref 150–400)
RBC: 4.33 MIL/uL (ref 3.87–5.11)
RDW: 13.4 % (ref 11.5–15.5)
WBC: 22.4 10*3/uL — ABNORMAL HIGH (ref 4.0–10.5)

## 2015-10-22 LAB — URINE MICROSCOPIC-ADD ON

## 2015-10-22 LAB — I-STAT ARTERIAL BLOOD GAS, ED
Acid-base deficit: 3 mmol/L — ABNORMAL HIGH (ref 0.0–2.0)
Bicarbonate: 22.4 mEq/L (ref 20.0–24.0)
O2 Saturation: 95 %
PCO2 ART: 41.5 mmHg (ref 35.0–45.0)
PH ART: 7.34 — AB (ref 7.350–7.450)
Patient temperature: 98.6
TCO2: 24 mmol/L (ref 0–100)
pO2, Arterial: 80 mmHg (ref 80.0–100.0)

## 2015-10-22 LAB — URINALYSIS, ROUTINE W REFLEX MICROSCOPIC
BILIRUBIN URINE: NEGATIVE
Glucose, UA: NEGATIVE mg/dL
Ketones, ur: NEGATIVE mg/dL
Leukocytes, UA: NEGATIVE
Nitrite: NEGATIVE
PH: 6 (ref 5.0–8.0)
Protein, ur: NEGATIVE mg/dL
SPECIFIC GRAVITY, URINE: 1.012 (ref 1.005–1.030)

## 2015-10-22 LAB — RAPID URINE DRUG SCREEN, HOSP PERFORMED
AMPHETAMINES: NOT DETECTED
Barbiturates: NOT DETECTED
Benzodiazepines: NOT DETECTED
Cocaine: NOT DETECTED
Opiates: NOT DETECTED
TETRAHYDROCANNABINOL: POSITIVE — AB

## 2015-10-22 LAB — I-STAT TROPONIN, ED: TROPONIN I, POC: 0 ng/mL (ref 0.00–0.08)

## 2015-10-22 LAB — SALICYLATE LEVEL

## 2015-10-22 LAB — ETHANOL: Alcohol, Ethyl (B): <5 mg/dL (ref <5)

## 2015-10-22 LAB — I-STAT CG4 LACTIC ACID, ED
LACTIC ACID, VENOUS: 0.85 mmol/L (ref 0.5–2.0)
Lactic Acid, Venous: 1.31 mmol/L (ref 0.5–2.0)

## 2015-10-22 LAB — ACETAMINOPHEN LEVEL: Acetaminophen (Tylenol), Serum: <10 ug/mL — ABNORMAL LOW (ref 10–30)

## 2015-10-22 LAB — D-DIMER, QUANTITATIVE (NOT AT ARMC): D DIMER QUANT: 10.05 ug{FEU}/mL — AB (ref 0.00–0.50)

## 2015-10-22 MED ORDER — SODIUM CHLORIDE 0.9 % IV BOLUS (SEPSIS)
1000.0000 mL | Freq: Once | INTRAVENOUS | Status: AC
  Administered 2015-10-22: 1000 mL via INTRAVENOUS

## 2015-10-22 MED ORDER — SODIUM CHLORIDE 0.9 % IV BOLUS (SEPSIS)
500.0000 mL | Freq: Once | INTRAVENOUS | Status: AC
  Administered 2015-10-22: 500 mL via INTRAVENOUS

## 2015-10-22 MED ORDER — IOPAMIDOL (ISOVUE-370) INJECTION 76%
INTRAVENOUS | Status: AC
Start: 2015-10-22 — End: 2015-10-22
  Administered 2015-10-22: 100 mL
  Filled 2015-10-22: qty 100

## 2015-10-22 MED ORDER — ACETAMINOPHEN 500 MG PO TABS
1000.0000 mg | ORAL_TABLET | Freq: Once | ORAL | Status: AC
  Administered 2015-10-22: 1000 mg via ORAL
  Filled 2015-10-22: qty 2

## 2015-10-22 NOTE — ED Notes (Addendum)
Pt took c-collar off.  Refuses to put it back on.  Family at bedside trying to get pt to cooperate.  Dr. Jodi MourningZavitz notified.

## 2015-10-22 NOTE — ED Notes (Signed)
Report from [facility]>  Pt was standing in kitchen at home and family reports she didn't look like she felt good.  Pt had a syncopal episode and hit head on floor.  +LOC that lasted 2-3 min.  On EMS arrival pt alert and oriented.  BP 72/50.  Pt was anxious and didn't want to be transported to hospital.  Vomited x 1.  Reports headache.  Denies dizziness.  Received bolus and repeat BP 80/46.

## 2015-10-22 NOTE — ED Provider Notes (Signed)
Formula pain and diarrhea over the lateral CSN: [id number]     Arrival date & time 10/22/15  1731 History   First MD Initiated Contact with Patient 10/22/15 1741     Chief Complaint  Patient presents with  . Loss of Consciousness     (Consider location/radiation/quality/duration/timing/severity/associated sxs/prior Treatment) HPI Comments: 65 year old female history of tobacco abuse, coronary artery disease, cardiac murmur presents after syncopal episode and hypotension. EMS brought the patient and she lives at home with her son. Patient states she felt well this morning and then this evening felt fatigue and then passed out. No known seizure history. Patient denies any bleeding recently, no fevers or known infection. Patient has had a mild cough. No recent hospitalizations, no blood clot history, no unilateral leg swelling, no chest pain or shortness of breath today. Blood pressure 70 systolic in the field 1 L fluid bolus given.  Patient is a 65 y.o. female presenting with syncope. The history is provided by the patient and the EMS personnel.  Loss of Consciousness Associated symptoms: headaches (after hitting head from syncope)   Associated symptoms: no chest pain, no fever, no shortness of breath and no vomiting     Past Medical History  Diagnosis Date  . Hypertension   . Stented coronary artery   . CAD (coronary artery disease)   . HTN (hypertension)   . HLD (hyperlipidemia)   . Colon polyps   . Esophageal reflux    Past Surgical History  Procedure Laterality Date  . Appendectomy    . Tumor removal      ovarian  . Back surgery    . Cardiac catheterization  2006   No family history on file. Social History  Substance Use Topics  . Smoking status: Current Every Day Smoker    Types: Cigarettes  . Smokeless tobacco: None  . Alcohol Use: No   OB History    No data available     Review of Systems  Constitutional: Positive for appetite change and fatigue. Negative for  fever and chills.  HENT: Negative for congestion.   Eyes: Negative for visual disturbance.  Respiratory: Positive for cough. Negative for shortness of breath.   Cardiovascular: Positive for syncope. Negative for chest pain.  Gastrointestinal: Negative for vomiting and abdominal pain.  Genitourinary: Negative for dysuria and flank pain.  Musculoskeletal: Positive for neck pain. Negative for back pain and neck stiffness.  Skin: Negative for rash.  Neurological: Positive for headaches (after hitting head from syncope). Negative for light-headedness.      Allergies  Codeine and Morphine  Home Medications   Prior to Admission medications   Medication Sig Start Date End Date Taking? Authorizing Provider  citalopram (CELEXA) 20 MG tablet Take 20 mg by mouth daily. 09/09/15  Yes Historical Provider, MD  diazepam (VALIUM) 5 MG tablet Take 1 tablet by mouth 3 (three) times daily as needed for anxiety.  02/19/14  Yes Historical Provider, MD  HYDROcodone-acetaminophen (NORCO) 10-325 MG per tablet Take 1 tablet by mouth every 6 (six) hours as needed for moderate pain.  10/30/11  Yes Historical Provider, MD  iron polysaccharides (FERREX 150) 150 MG capsule Take 150 mg by mouth daily. 09/18/15  Yes Historical Provider, MD  lisinopril (PRINIVIL,ZESTRIL) 10 MG tablet Take 10 mg by mouth daily.   Yes Historical Provider, MD  meloxicam (MOBIC) 15 MG tablet Take 15 mg by mouth daily as needed (for muscle or joint pain).  07/31/15  Yes Historical Provider, MD  NITROSTAT 0.4  MG SL tablet Place 0.4 mg under the tongue every 5 (five) minutes as needed for chest pain.  01/05/14  Yes Historical Provider, MD  omeprazole (PRILOSEC) 20 MG capsule Take 20 mg by mouth daily. 10/02/15  Yes Historical Provider, MD  pravastatin (PRAVACHOL) 80 MG tablet Take 80 mg by mouth daily. 10/02/15  Yes Historical Provider, MD  aspirin 81 MG tablet Take 81 mg by mouth daily.    Historical Provider, MD  Cholecalciferol (VITAMIN D PO) Take  1 capsule by mouth daily.    Historical Provider, MD  esomeprazole (NEXIUM) 40 MG capsule Take 40 mg by mouth daily at 12 noon.    Historical Provider, MD  losartan (COZAAR) 25 MG tablet Take 25 mg by mouth daily.    Historical Provider, MD  metoprolol (LOPRESSOR) 50 MG tablet Take 50 mg by mouth daily.     Historical Provider, MD   BP 115/44 mmHg  Pulse 73  Temp(Src) 97.4 F (36.3 C) (Rectal)  Resp 23  Ht 5\' 5"  (1.651 m)  Wt 160 lb (72.576 kg)  BMI 26.63 kg/m2  SpO2 91% Physical Exam  Constitutional: She is oriented to person, place, and time. She appears well-developed and well-nourished.  HENT:  Head: Normocephalic and atraumatic.  Dry mucous membranes  Eyes: Conjunctivae are normal. Right eye exhibits no discharge. Left eye exhibits no discharge.  Neck: Normal range of motion. Neck supple. No tracheal deviation present.  Cardiovascular: Normal rate and regular rhythm.   Pulmonary/Chest: Effort normal and breath sounds normal.  Abdominal: Soft. She exhibits no distension. There is no tenderness. There is no guarding.  Musculoskeletal: She exhibits no edema.  Patient has upper cervical tenderness paraspinal, no midline tenderness lumbar thoracic spine.  Neurological: She is alert and oriented to person, place, and time. No cranial nerve deficit. GCS eye subscore is 4. GCS verbal subscore is 5. GCS motor subscore is 6.  Patient has fatigue appearance. Patient moves all extremities equal bilateral upper lower extremities with mild subtle weakness nonfocal. No arm or leg drift. Finger nose intact. Extraocular muscle function intact. Sensation intact bilateral upper lower extent of palpation. Patient is alert to name and city however nonspecific location.  Skin: Skin is warm. No rash noted.  Psychiatric: She has a normal mood and affect.  Nursing note and vitals reviewed.   ED Course  Procedures (including critical care time) CRITICAL CARE Performed by: Enid Skeens   Total  critical care time: 35 minutes  Critical care time was exclusive of separately billable procedures and treating other patients.  Critical care was necessary to treat or prevent imminent or life-threatening deterioration.  Critical care was time spent personally by me on the following activities: development of treatment plan with patient and/or surrogate as well as nursing, discussions with consultants, evaluation of patient's response to treatment, examination of patient, obtaining history from patient or surrogate, ordering and performing treatments and interventions, ordering and review of laboratory studies, ordering and review of radiographic studies, pulse oximetry and re-evaluation of patient's condition.    EMERGENCY DEPARTMENT [place] CARDIAC EXAM "Study: Limited Ultrasound of the heart and pericardium"  INDICATIONS:Hypotension Multiple views of the heart and pericardium were obtained in real-time with a multi-frequency probe.  PERFORMED QM:VHQION  IMAGES ARCHIVED?: Yes  FINDINGS: No pericardial effusion, Normal contractility, IVC flat and Tamponade physiology absent  LIMITATIONS:  Body habitus  VIEWS USED: Subcostal 4 chamber, Parasternal long axis, Parasternal short axis, Apical 4 chamber  and Inferior Vena Cava  INTERPRETATION:  Cardiac activity present, Pericardial effusioin absent, Cardiac tamponade absent and Probable low CVP  CPT Code: 16109-6093308-26 (limited transthoracic cardiac)  EMERGENCY DEPARTMENT ULTRASOUND  Study: Limited Retroperitoneal Ultrasound of the Abdominal Aorta.  INDICATIONS:Hypotension Multiple views of the abdominal aorta were obtained in real-time from the diaphragmatic hiatus to the aortic bifurcation in transverse planes with a multi-frequency probe. PERFORMED BY: Myself IMAGES ARCHIVED?: Yes FINDINGS: Maximum aortic dimensions are <2 cm LIMITATIONS:  Body habitus and Bowel gas INTERPRETATION:  No abdominal aortic aneurysm    CPT Code: 45409-8176775-26  (limited retroperitoneal)   Labs Review Labs Reviewed  COMPREHENSIVE METABOLIC PANEL - Abnormal; Notable for the following:    Glucose, Bld 106 (*)    Calcium 8.4 (*)    Anion gap 4 (*)    All other components within normal limits  CBC WITH DIFFERENTIAL/PLATELET - Abnormal; Notable for the following:    WBC 22.4 (*)    Hemoglobin 11.9 (*)    Neutro Abs 19.3 (*)    Monocytes Absolute 1.6 (*)    All other components within normal limits  URINALYSIS, ROUTINE W REFLEX MICROSCOPIC (NOT AT [facility]) - Abnormal; Notable for the following:    Hgb urine dipstick TRACE (*)    All other components within normal limits  D-DIMER, QUANTITATIVE (NOT AT [facility]) - Abnormal; Notable for the following:    D-Dimer, Quant 10.05 (*)    All other components within normal limits  URINE RAPID DRUG SCREEN, HOSP PERFORMED - Abnormal; Notable for the following:    Tetrahydrocannabinol POSITIVE (*)    All other components within normal limits  ACETAMINOPHEN LEVEL - Abnormal; Notable for the following:    Acetaminophen (Tylenol), Serum <10 (*)    All other components within normal limits  URINE MICROSCOPIC-ADD ON - Abnormal; Notable for the following:    Squamous Epithelial / LPF 0-5 (*)    Bacteria, UA RARE (*)    All other components within normal limits  CULTURE, BLOOD (ROUTINE X 2)  CULTURE, BLOOD (ROUTINE X 2)  URINE CULTURE  SALICYLATE LEVEL  ETHANOL  OCCULT BLOOD X 1 CARD TO LAB, STOOL  BRAIN NATRIURETIC PEPTIDE  BLOOD GAS, ARTERIAL  I-STAT CG4 LACTIC ACID, ED  I-STAT TROPOININ, ED  I-STAT CG4 LACTIC ACID, ED  I-STAT CG4 LACTIC ACID, ED    Imaging Review Ct Head Wo Contrast  10/22/2015  CLINICAL DATA:  Pain following fall. Loss of consciousness after fall EXAM: CT HEAD WITHOUT CONTRAST CT CERVICAL SPINE WITHOUT CONTRAST TECHNIQUE: Multidetector CT imaging of the head and cervical spine was performed following the standard protocol without intravenous contrast. Multiplanar CT image reconstructions  of the cervical spine were also generated. COMPARISON:  CT head February 01, 2011; CT cervical spine August 12, 2011 FINDINGS: CT HEAD FINDINGS The ventricles are normal in size and configuration for age. There is no intracranial mass, hemorrhage, extra-axial fluid collection, or midline shift. Gray-white compartments appear normal. No acute infarct evident. There is a the midline posterior parietal scalp hematoma. The bony calvarium appears intact. Mastoid air cells are clear. There is extensive opacification of most of the maxillary antra bilaterally. There is mild mucosal thickening in several ethmoid air cells bilaterally. Frontal sinuses are hypoplastic. No orbital lesions are evident. CT CERVICAL SPINE FINDINGS There is no fracture or spondylolisthesis. Prevertebral soft tissues and predental space regions are normal. The disc spaces appear unremarkable. There is no nerve root edema or effacement. No disc extrusion or stenosis. There is mild facet hypertrophy at several levels bilaterally.  There is a dominant mass arising from the posterior aspect of the right lobe of the thyroid measuring 1.7 x 1.6 cm, essentially stable from 2013 study. There are foci of carotid artery calcification bilaterally. There is also calcification in the midportion of the right vertebral artery. IMPRESSION: CT: No intracranial mass, hemorrhage, or extra-axial fluid collection. Gray-white compartments appear normal. There is a the fairly prominent posterior parietal scalp hematoma in the midline. No fracture evident. There is extensive maxillary sinus disease bilaterally with mild ethmoid sinus disease bilaterally. CT cervical spine: No fracture or spondylolisthesis. Mild osteoarthritic changes several levels without nerve root edema or effacement. No disc extrusion or stenosis. Extensive arterial vascular calcification noted in both carotid arteries and in the right vertebral artery. There is a dominant mass arising from the  posterior aspect the right lobe of the thyroid. This mass was present on prior study from 2013 and has remained stable. Electronically Signed   By: Bretta Bang III M.D.   On: 10/22/2015 19:38   Ct Cervical Spine Wo Contrast  10/22/2015  CLINICAL DATA:  Pain following fall. Loss of consciousness after fall EXAM: CT HEAD WITHOUT CONTRAST CT CERVICAL SPINE WITHOUT CONTRAST TECHNIQUE: Multidetector CT imaging of the head and cervical spine was performed following the standard protocol without intravenous contrast. Multiplanar CT image reconstructions of the cervical spine were also generated. COMPARISON:  CT head February 01, 2011; CT cervical spine August 12, 2011 FINDINGS: CT HEAD FINDINGS The ventricles are normal in size and configuration for age. There is no intracranial mass, hemorrhage, extra-axial fluid collection, or midline shift. Gray-white compartments appear normal. No acute infarct evident. There is a the midline posterior parietal scalp hematoma. The bony calvarium appears intact. Mastoid air cells are clear. There is extensive opacification of most of the maxillary antra bilaterally. There is mild mucosal thickening in several ethmoid air cells bilaterally. Frontal sinuses are hypoplastic. No orbital lesions are evident. CT CERVICAL SPINE FINDINGS There is no fracture or spondylolisthesis. Prevertebral soft tissues and predental space regions are normal. The disc spaces appear unremarkable. There is no nerve root edema or effacement. No disc extrusion or stenosis. There is mild facet hypertrophy at several levels bilaterally. There is a dominant mass arising from the posterior aspect of the right lobe of the thyroid measuring 1.7 x 1.6 cm, essentially stable from 2013 study. There are foci of carotid artery calcification bilaterally. There is also calcification in the midportion of the right vertebral artery. IMPRESSION: CT: No intracranial mass, hemorrhage, or extra-axial fluid collection.  Gray-white compartments appear normal. There is a the fairly prominent posterior parietal scalp hematoma in the midline. No fracture evident. There is extensive maxillary sinus disease bilaterally with mild ethmoid sinus disease bilaterally. CT cervical spine: No fracture or spondylolisthesis. Mild osteoarthritic changes several levels without nerve root edema or effacement. No disc extrusion or stenosis. Extensive arterial vascular calcification noted in both carotid arteries and in the right vertebral artery. There is a dominant mass arising from the posterior aspect the right lobe of the thyroid. This mass was present on prior study from 2013 and has remained stable. Electronically Signed   By: Bretta Bang III M.D.   On: 10/22/2015 19:38   Dg Chest Port 1 View  10/22/2015  CLINICAL DATA:  Cough and chest pain. EXAM: PORTABLE CHEST 1 VIEW COMPARISON:  03/04/2014 and prior exams FINDINGS: Cardiomegaly and mild peribronchial thickening again noted. A stable right middle lobe nodule is again noted. There is no evidence of  focal airspace disease, pulmonary edema, suspicious pulmonary nodule/mass, pleural effusion, or pneumothorax. No acute bony abnormalities are identified. IMPRESSION: Cardiomegaly without evidence of acute cardiopulmonary disease. Electronically Signed   By: Harmon Pier M.D.   On: 10/22/2015 18:42   I have personally reviewed and evaluated these images and lab results as part of my medical decision-making.   EKG Interpretation None     EKG reviewed sinus heart rate 62, Q waves V1 V2, normal QT. MDM   Final diagnoses:  Syncope and collapse  Hypotension, unspecified hypotension type  Acute head injury, initial encounter   Patient presents with syncope and hypotension, patient has had decreased by mouth intake recently. Syncope she states is more sudden onset. Patient tired appearing blood pressure 70s systolic. Sepsis and cardiac workup initiated. Plan for multiple IV fluid  boluses, CT scan head neck and further evaluation.  Patient improved with multiple fluid boluses. Family arrived to help clarify details. Patient has been depressed since the loss of her husband and has not been taking good care of herself. Patient requiring 2 L nasal cannula which is new for her. Chest x-ray no significant findings. Patient low risk overall for blood clot. D-dimer significantly elevated. CT angina the chest ordered.  Plan for admission to the hospital. BP improved in ED.   Discussed with radiologist no acute pulmonary embolism detailed report to follow. No definitive source of white blood cell count elevation.,  The patients results and plan were reviewed and discussed.   Any x-rays performed were independently reviewed by myself.   Differential diagnosis were considered with the presenting HPI.  Medications  iopamidol (ISOVUE-370) 76 % injection (not administered)  sodium chloride 0.9 % bolus 1,000 mL (0 mLs Intravenous Stopped 10/22/15 1850)    And  sodium chloride 0.9 % bolus 1,000 mL (1,000 mLs Intravenous New Bag/Given 10/22/15 1808)    And  sodium chloride 0.9 % bolus 500 mL (500 mLs Intravenous New Bag/Given 10/22/15 1850)  acetaminophen (TYLENOL) tablet 1,000 mg (1,000 mg Oral Given 10/22/15 2116)    Filed Vitals:   10/22/15 2143 10/22/15 2145 10/22/15 2200 10/22/15 2215  BP:  126/51 130/63 115/44  Pulse:  69 78 73  Temp:      TempSrc:      Resp:  Height:      Weight:      SpO2: 93% 97% 94% 91%    Final diagnoses:  Syncope and collapse  Hypotension, unspecified hypotension type  Acute head injury, initial encounter    Admission/ observation were discussed with the admitting physician, patient and/or family and they are comfortable with the plan.    Blane Ohara, MD 10/22/15 [phone]

## 2015-10-22 NOTE — ED Notes (Signed)
Pt taken to CT.

## 2015-10-22 NOTE — ED Notes (Signed)
Dr. Jodi MourningZavitz at bedside doing a FAST US.

## 2015-10-22 NOTE — ED Notes (Signed)
Pt trying to remove c-collar.  Explained to her why it needed to stay on for now.

## 2015-10-22 NOTE — ED Notes (Signed)
Dr Zavitz at bedside  

## 2015-10-22 NOTE — ED Notes (Signed)
Patient transported to CT 

## 2015-10-23 ENCOUNTER — Encounter (HOSPITAL_COMMUNITY): Payer: Self-pay | Admitting: Internal Medicine

## 2015-10-23 DIAGNOSIS — R791 Abnormal coagulation profile: Secondary | ICD-10-CM

## 2015-10-23 DIAGNOSIS — E785 Hyperlipidemia, unspecified: Secondary | ICD-10-CM | POA: Diagnosis not present

## 2015-10-23 DIAGNOSIS — R7989 Other specified abnormal findings of blood chemistry: Secondary | ICD-10-CM | POA: Diagnosis present

## 2015-10-23 DIAGNOSIS — D72829 Elevated white blood cell count, unspecified: Secondary | ICD-10-CM | POA: Diagnosis present

## 2015-10-23 DIAGNOSIS — F329 Major depressive disorder, single episode, unspecified: Secondary | ICD-10-CM | POA: Diagnosis present

## 2015-10-23 DIAGNOSIS — R55 Syncope and collapse: Secondary | ICD-10-CM | POA: Diagnosis not present

## 2015-10-23 DIAGNOSIS — F17200 Nicotine dependence, unspecified, uncomplicated: Secondary | ICD-10-CM | POA: Diagnosis present

## 2015-10-23 DIAGNOSIS — F1290 Cannabis use, unspecified, uncomplicated: Secondary | ICD-10-CM | POA: Diagnosis present

## 2015-10-23 DIAGNOSIS — I25110 Atherosclerotic heart disease of native coronary artery with unstable angina pectoris: Secondary | ICD-10-CM

## 2015-10-23 DIAGNOSIS — I10 Essential (primary) hypertension: Secondary | ICD-10-CM

## 2015-10-23 DIAGNOSIS — F32A Depression, unspecified: Secondary | ICD-10-CM | POA: Diagnosis present

## 2015-10-23 DIAGNOSIS — K219 Gastro-esophageal reflux disease without esophagitis: Secondary | ICD-10-CM | POA: Diagnosis present

## 2015-10-23 DIAGNOSIS — J449 Chronic obstructive pulmonary disease, unspecified: Secondary | ICD-10-CM | POA: Diagnosis present

## 2015-10-23 DIAGNOSIS — I959 Hypotension, unspecified: Secondary | ICD-10-CM | POA: Diagnosis present

## 2015-10-23 LAB — COMPREHENSIVE METABOLIC PANEL
ALBUMIN: 2.7 g/dL — AB (ref 3.5–5.0)
ALT: 14 U/L (ref 14–54)
AST: 19 U/L (ref 15–41)
Alkaline Phosphatase: 42 U/L (ref 38–126)
Anion gap: 3 — ABNORMAL LOW (ref 5–15)
BUN: 10 mg/dL (ref 6–20)
CHLORIDE: 115 mmol/L — AB (ref 101–111)
CO2: 22 mmol/L (ref 22–32)
Calcium: 7.6 mg/dL — ABNORMAL LOW (ref 8.9–10.3)
Creatinine, Ser: 0.64 mg/dL (ref 0.44–1.00)
GFR calc Af Amer: >60 mL/min (ref >60)
GFR calc non Af Amer: >60 mL/min (ref >60)
GLUCOSE: 80 mg/dL (ref 65–99)
POTASSIUM: 4.3 mmol/L (ref 3.5–5.1)
Sodium: 140 mmol/L (ref 135–145)
Total Bilirubin: 0.5 mg/dL (ref 0.3–1.2)
Total Protein: 5.3 g/dL — ABNORMAL LOW (ref 6.5–8.1)

## 2015-10-23 LAB — CBC WITH DIFFERENTIAL/PLATELET
Basophils Absolute: 0 10*3/uL (ref 0.0–0.1)
Basophils Relative: 0 %
EOS PCT: 2 %
Eosinophils Absolute: 0.2 10*3/uL (ref 0.0–0.7)
HEMATOCRIT: 31.3 % — AB (ref 36.0–46.0)
Hemoglobin: 9.8 g/dL — ABNORMAL LOW (ref 12.0–15.0)
LYMPHS ABS: 2 10*3/uL (ref 0.7–4.0)
LYMPHS PCT: 23 %
MCH: 27.8 pg (ref 26.0–34.0)
MCHC: 31.3 g/dL (ref 30.0–36.0)
MCV: 88.7 fL (ref 78.0–100.0)
Monocytes Absolute: 1.2 10*3/uL — ABNORMAL HIGH (ref 0.1–1.0)
Monocytes Relative: 13 %
NEUTROS ABS: 5.5 10*3/uL (ref 1.7–7.7)
Neutrophils Relative %: 62 %
PLATELETS: 216 10*3/uL (ref 150–400)
RBC: 3.53 MIL/uL — AB (ref 3.87–5.11)
RDW: 13.9 % (ref 11.5–15.5)
WBC: 9 10*3/uL (ref 4.0–10.5)

## 2015-10-23 LAB — TROPONIN I
TROPONIN I: 0.12 ng/mL — AB (ref <0.031)
Troponin I: 0.17 ng/mL — ABNORMAL HIGH (ref <0.031)
Troponin I: 0.16 ng/mL — ABNORMAL HIGH (ref <0.031)

## 2015-10-23 LAB — URINE CULTURE: Culture: <10000 — AB

## 2015-10-23 LAB — BRAIN NATRIURETIC PEPTIDE: B NATRIURETIC PEPTIDE 5: 191.6 pg/mL — AB (ref 0.0–100.0)

## 2015-10-23 LAB — OCCULT BLOOD X 1 CARD TO LAB, STOOL: FECAL OCCULT BLD: NEGATIVE

## 2015-10-23 LAB — GLUCOSE, CAPILLARY: GLUCOSE-CAPILLARY: 88 mg/dL (ref 65–99)

## 2015-10-23 LAB — D-DIMER, QUANTITATIVE: D-Dimer, Quant: 2.32 ug/mL-FEU — ABNORMAL HIGH (ref 0.00–0.50)

## 2015-10-23 MED ORDER — ONDANSETRON HCL 4 MG/2ML IJ SOLN
4.0000 mg | Freq: Four times a day (QID) | INTRAMUSCULAR | Status: DC | PRN
  Filled 2015-10-23: qty 2

## 2015-10-23 MED ORDER — ONDANSETRON HCL 4 MG PO TABS
4.0000 mg | ORAL_TABLET | Freq: Four times a day (QID) | ORAL | Status: DC | PRN
  Administered 2015-10-23: 4 mg via ORAL
  Filled 2015-10-23: qty 1

## 2015-10-23 MED ORDER — HYDROCODONE-ACETAMINOPHEN 10-325 MG PO TABS
1.0000 | ORAL_TABLET | Freq: Four times a day (QID) | ORAL | Status: DC | PRN
  Administered 2015-10-23 – 2015-10-26 (×10): 1 via ORAL
  Filled 2015-10-23 (×11): qty 1

## 2015-10-23 MED ORDER — ASPIRIN 81 MG PO CHEW
81.0000 mg | CHEWABLE_TABLET | Freq: Every day | ORAL | Status: DC
  Administered 2015-10-23 – 2015-10-26 (×4): 81 mg via ORAL
  Filled 2015-10-23 (×4): qty 1

## 2015-10-23 MED ORDER — DIAZEPAM 5 MG PO TABS
5.0000 mg | ORAL_TABLET | Freq: Three times a day (TID) | ORAL | Status: DC | PRN
  Administered 2015-10-25: 5 mg via ORAL
  Filled 2015-10-23: qty 1

## 2015-10-23 MED ORDER — SODIUM CHLORIDE 0.9% FLUSH
3.0000 mL | Freq: Two times a day (BID) | INTRAVENOUS | Status: DC
  Administered 2015-10-23 – 2015-10-24 (×3): 3 mL via INTRAVENOUS

## 2015-10-23 MED ORDER — PRAVASTATIN SODIUM 40 MG PO TABS
80.0000 mg | ORAL_TABLET | Freq: Every day | ORAL | Status: DC
  Administered 2015-10-23 – 2015-10-26 (×3): 80 mg via ORAL
  Filled 2015-10-23 (×3): qty 2

## 2015-10-23 MED ORDER — MELOXICAM 7.5 MG PO TABS
15.0000 mg | ORAL_TABLET | Freq: Every day | ORAL | Status: DC | PRN
  Filled 2015-10-23: qty 1

## 2015-10-23 MED ORDER — PANTOPRAZOLE SODIUM 40 MG PO TBEC: 40.0000 mg | DELAYED_RELEASE_TABLET | Freq: Every day | ORAL | Status: DC

## 2015-10-23 MED ORDER — NITROGLYCERIN 0.4 MG SL SUBL
0.4000 mg | SUBLINGUAL_TABLET | SUBLINGUAL | Status: DC | PRN
  Administered 2015-10-25: 0.4 mg via SUBLINGUAL
  Filled 2015-10-23: qty 1

## 2015-10-23 MED ORDER — POLYSACCHARIDE IRON COMPLEX 150 MG PO CAPS
150.0000 mg | ORAL_CAPSULE | Freq: Every day | ORAL | Status: DC
  Administered 2015-10-23 – 2015-10-26 (×3): 150 mg via ORAL
  Filled 2015-10-23 (×3): qty 1

## 2015-10-23 MED ORDER — LIDOCAINE 5 % EX PTCH
1.0000 | MEDICATED_PATCH | CUTANEOUS | Status: DC
  Administered 2015-10-23 – 2015-10-25 (×3): 1 via TRANSDERMAL
  Filled 2015-10-23 (×3): qty 1

## 2015-10-23 MED ORDER — PANTOPRAZOLE SODIUM 40 MG PO TBEC
40.0000 mg | DELAYED_RELEASE_TABLET | Freq: Every day | ORAL | Status: DC
  Administered 2015-10-23 – 2015-10-26 (×3): 40 mg via ORAL
  Filled 2015-10-23 (×3): qty 1

## 2015-10-23 MED ORDER — NICOTINE 21 MG/24HR TD PT24
21.0000 mg | MEDICATED_PATCH | Freq: Every day | TRANSDERMAL | Status: DC
  Administered 2015-10-23 – 2015-10-26 (×4): 21 mg via TRANSDERMAL
  Filled 2015-10-23 (×5): qty 1

## 2015-10-23 MED ORDER — SODIUM CHLORIDE 0.9% FLUSH: 3.0000 mL | Freq: Two times a day (BID) | INTRAVENOUS | Status: DC

## 2015-10-23 MED ORDER — CITALOPRAM HYDROBROMIDE 20 MG PO TABS
20.0000 mg | ORAL_TABLET | Freq: Every day | ORAL | Status: DC
  Administered 2015-10-23 – 2015-10-26 (×4): 20 mg via ORAL
  Filled 2015-10-23 (×4): qty 1

## 2015-10-23 MED ORDER — POTASSIUM CHLORIDE IN NACL 20-0.9 MEQ/L-% IV SOLN
INTRAVENOUS | Status: DC
  Administered 2015-10-23 – 2015-10-26 (×5): via INTRAVENOUS
  Filled 2015-10-23 (×8): qty 1000

## 2015-10-23 NOTE — Consult Note (Signed)
CARDIOLOGY CONSULT NOTE   Patient ID: [name] MRN: [id number] DOB/AGE: [birth year] 65 y.o.  Admit date: 10/22/2015  Requesting Physician: Dr. Elisabeth Pigeonevine Primary Physician:   Pamelia HoitWILSON,FRED HENRY, MD Primary Cardiologist:  Dr. Tenny Crawoss Reason for Consultation:   Syncope and elevated troponin.   HPI: [name] is a 65 y.o. female with a history of HTN, CAD s/p remote PCI to pLAD, COPD, tobacco abuse, HTN, and HLD who was brought [facility] via EMS on 10/22/15 after an episode of syncope. Her troponin was noted to be elevated and cardiology was consulted.    Cath in 2001 showed patent stent in pLAD and tight 95% D1 not amendable to PCI due to location and size. Last cath in 2006 showed continued patency of her LAD stent and continued high grade ostial stenosis of the small to intermediate diag branch. She had normal EF, mod LVH and G1DD on 2D ECHO in 2012. She was previoulsy followed by Dr. Tenny Crawoss, but I do not see that she has been seen in quite some time.   She recently lost her husband about 8 months ago and has been severely depressed and has not been doing anything but laying in bed. She smokes 2.5 PPD and does not eat very much. She does not do any amount of exertion but [facility] She does get very dyspneic but has COPD.   Last night, she was fixing herself a bowl of cereal when she suddenly passed out and woke up on an EMS stretcher. Apparently patient was very hypotensive upon EMS arrival w/ SBP in 60s. She did have an episode of emesis while with EMS. She denied any prodromal symptoms of chest pain, lightheadedness, SOB, or nausea. She had no loss of bowel or bladder control and no convulsions but son says that she does have seizure like activity sometimes. Her arms will sometimes convulse? This even happened today. No LE edema, orthopnea or PND. No palpitations. She does have some rib pain under her left breast that is worse with  palpation   Past Medical History  Diagnosis Date  . Hypertension   . Stented coronary artery   . CAD (coronary artery disease)   . HTN (hypertension)   . HLD (hyperlipidemia)   . Colon polyps   . Esophageal reflux      Past Surgical History  Procedure Laterality Date  . Appendectomy    . Tumor removal      ovarian  . Back surgery    . Cardiac catheterization  2006    Allergies  Allergen Reactions  . Codeine     REACTION: nausea--if she does not eat  . Morphine     REACTION: nausea    I have reviewed the patient's current medications . aspirin  81 mg Oral Daily  . citalopram  20 mg Oral Daily  . iron polysaccharides  150 mg Oral Daily  . nicotine  21 mg Transdermal Daily  . pantoprazole  40 mg Oral Daily  . pravastatin  80 mg Oral Daily  . sodium chloride flush  3 mL Intravenous Q12H  . sodium chloride flush  3 mL Intravenous Q12H   . 0.9 % NaCl with KCl 20 mEq / L 125 mL/hr at 10/23/15 0321   diazepam, HYDROcodone-acetaminophen, meloxicam, nitroGLYCERIN, ondansetron **OR** ondansetron (ZOFRAN) IV  Prior to Admission medications   Medication Sig Start Date End Date Taking? Authorizing Provider  citalopram (CELEXA) 20 MG tablet  Take 20 mg by mouth daily. 09/09/15  Yes Historical Provider, MD  diazepam (VALIUM) 5 MG tablet Take 1 tablet by mouth 3 (three) times daily as needed for anxiety.  02/19/14  Yes Historical Provider, MD  HYDROcodone-acetaminophen (NORCO) 10-325 MG per tablet Take 1 tablet by mouth every 6 (six) hours as needed for moderate pain.  10/30/11  Yes Historical Provider, MD  iron polysaccharides (FERREX 150) 150 MG capsule Take 150 mg by mouth daily. 09/18/15  Yes Historical Provider, MD  lisinopril (PRINIVIL,ZESTRIL) 10 MG tablet Take 10 mg by mouth daily.   Yes Historical Provider, MD  meloxicam (MOBIC) 15 MG tablet Take 15 mg by mouth daily as needed (for muscle or joint pain).  07/31/15  Yes Historical Provider, MD  NITROSTAT 0.4 MG SL tablet Place  0.4 mg under the tongue every 5 (five) minutes as needed for chest pain.  01/05/14  Yes Historical Provider, MD  omeprazole (PRILOSEC) 20 MG capsule Take 20 mg by mouth daily. 10/02/15  Yes Historical Provider, MD  pravastatin (PRAVACHOL) 80 MG tablet Take 80 mg by mouth daily. 10/02/15  Yes Historical Provider, MD  aspirin 81 MG tablet Take 81 mg by mouth daily.    Historical Provider, MD  Cholecalciferol (VITAMIN D PO) Take 1 capsule by mouth daily.    Historical Provider, MD  esomeprazole (NEXIUM) 40 MG capsule Take 40 mg by mouth daily at 12 noon.    Historical Provider, MD  losartan (COZAAR) 25 MG tablet Take 25 mg by mouth daily.    Historical Provider, MD  metoprolol (LOPRESSOR) 50 MG tablet Take 50 mg by mouth daily.     Historical Provider, MD     Social History   Social History  . Marital Status: Married    Spouse Name: N/A  . Number of Children: N/A  . Years of Education: N/A   Occupational History  . Not on file.   Social History Main Topics  . Smoking status: Current Every Day Smoker    Types: Cigarettes  . Smokeless tobacco: Not on file  . Alcohol Use: No  . Drug Use: No  . Sexual Activity: Not on file   Other Topics Concern  . Not on file   Social History Narrative    No family status information on file.   Family History  Problem Relation Age of Onset  . Stroke Mother   . Prostate cancer Father   . Heart failure Sister      ROS:  Full 14 point review of systems complete and found to be negative unless listed above.  Physical Exam: Blood pressure 132/48, pulse 53, temperature 98.5 F (36.9 C), temperature source Oral, resp. rate 18, height  (1.651 m), weight 160 lb (72.576 kg), SpO2 93 %.  General: Well developed, well nourished, female in no acute distress Head: Eyes PERRLA, No xanthomas.   Normocephalic and atraumatic, oropharynx without edema or exudate.  Lungs: ronchorous  Heart: HRRR S1 S2, no rub/gallop, Heart regular rate and rhythm with S1,  S2  murmur. pulses are 2+ extrem.   Neck:Bilateral carotid bruits. No lymphadenopathy.  No JVD. Abdomen: Bowel sounds present, abdomen soft and non-tender without masses or hernias noted. Msk:  No spine or cva tenderness. No weakness, no joint deformities or effusions. Extremities: No clubbing or cyanosis.  No LE edema.  Neuro: Alert and oriented X 3. No focal deficits noted. Psych:  Good affect, responds appropriately Skin: No rashes or lesions noted.  Labs:  Lab Results  Component Value Date   WBC 9.0 10/23/2015   HGB 9.8* 10/23/2015   HCT 31.3* 10/23/2015   MCV 88.7 10/23/2015   PLT 216 10/23/2015   No results for input(s): INR in the last 72 hours.  Recent Labs Lab 10/23/15 0754  NA 140  K 4.3  CL 115*  CO2 22  BUN 10  CREATININE 0.64  CALCIUM 7.6*  PROT 5.3*  BILITOT 0.5  ALKPHOS 42  ALT 14  AST 19  GLUCOSE 80  ALBUMIN 2.7*   No results found for: MG  Recent Labs  10/23/15 0235 10/23/15 0754 10/23/15 1043  TROPONINI 0.17* 0.16* 0.12*    Recent Labs  10/22/15 1913  TROPIPOC 0.00    Lab Results  Component Value Date   DDIMER 2.32* 10/23/2015    Echo: pending  ECG:  NSR HR 62. Non specific TW chagnes.   Radiology:  Ct Head Wo Contrast  10/22/2015  CLINICAL DATA:  Pain following fall. Loss of consciousness after fall EXAM: CT HEAD WITHOUT CONTRAST CT CERVICAL SPINE WITHOUT CONTRAST TECHNIQUE: Multidetector CT imaging of the head and cervical spine was performed following the standard protocol without intravenous contrast. Multiplanar CT image reconstructions of the cervical spine were also generated. COMPARISON:  CT head February 01, 2011; CT cervical spine August 12, 2011 FINDINGS: CT HEAD FINDINGS The ventricles are normal in size and configuration for age. There is no intracranial mass, hemorrhage, extra-axial fluid collection, or midline shift. Gray-white compartments appear normal. No acute infarct evident. There is a the midline posterior  parietal scalp hematoma. The bony calvarium appears intact. Mastoid air cells are clear. There is extensive opacification of most of the maxillary antra bilaterally. There is mild mucosal thickening in several ethmoid air cells bilaterally. Frontal sinuses are hypoplastic. No orbital lesions are evident. CT CERVICAL SPINE FINDINGS There is no fracture or spondylolisthesis. Prevertebral soft tissues and predental space regions are normal. The disc spaces appear unremarkable. There is no nerve root edema or effacement. No disc extrusion or stenosis. There is mild facet hypertrophy at several levels bilaterally. There is a dominant mass arising from the posterior aspect of the right lobe of the thyroid measuring 1.7 x 1.6 cm, essentially stable from 2013 study. There are foci of carotid artery calcification bilaterally. There is also calcification in the midportion of the right vertebral artery. IMPRESSION: CT: No intracranial mass, hemorrhage, or extra-axial fluid collection. Gray-white compartments appear normal. There is a the fairly prominent posterior parietal scalp hematoma in the midline. No fracture evident. There is extensive maxillary sinus disease bilaterally with mild ethmoid sinus disease bilaterally. CT cervical spine: No fracture or spondylolisthesis. Mild osteoarthritic changes several levels without nerve root edema or effacement. No disc extrusion or stenosis. Extensive arterial vascular calcification noted in both carotid arteries and in the right vertebral artery. There is a dominant mass arising from the posterior aspect the right lobe of the thyroid. This mass was present on prior study from 2013 and has remained stable. Electronically Signed   By: Bretta Bang III M.D.   On: 10/22/2015 19:38   Ct Angio Chest Pe W/cm &/or Wo Cm  10/22/2015  CLINICAL DATA:  65 year old female with shorts of breath and elevated D-dimers EXAM: CT ANGIOGRAPHY CHEST WITH CONTRAST TECHNIQUE: Multidetector CT  imaging of the chest was performed using the standard protocol during bolus administration of intravenous contrast. Multiplanar CT image reconstructions and MIPs were obtained to evaluate the vascular anatomy. CONTRAST:  100 cc Isovue  370 COMPARISON:  Chest radiograph dated 10/22/2015 and CT dated 02/01/2011 FINDINGS: There is mild diffuse interstitial prominence concerning for mild interstitial edema. There is no focal consolidation. No pleural effusion or pneumothorax. The central airways are patent. There is mild thickening of the bronchial wall. Clinical correlation is recommended to evaluate for bronchitis. There is a 9 mm nodule in the right middle lobe which appears minimally increased compared to the CT dated 2012. A stable appearing 11 mm nodular density is noted in the right lower lobe. There is atherosclerotic calcification of the thoracic aorta and aortic arch. There is no aortic dissection or aneurysm. No CT evidence of pulmonary embolism. There is mild dilatation of the left atrium as well as apparent circumferential thickening of the left ventricular wall. Correlation with echocardiogram recommended. There is no pericardial effusion. There is coronary vascular calcification. There is no hilar or mediastinal adenopathy. Small hiatal hernia. The esophagus is grossly unremarkable. There is no axillary adenopathy. The chest wall soft tissues appear unremarkable. There is degenerative changes of the spine. No acute fracture. Multiple small hepatic hypodense lesions are not characterized on this study. There is retrograde flow of contrast into the IVC indicative of a degree of right cardiac dysfunction. Review of the MIP images confirms the above findings. IMPRESSION: No CT evidence of pulmonary embolism. Right middle lobe and right lower lobe pulmonary nodules. There has been minimal interval increase in the size of the right middle lobe nodule since the study dated 2012. Mild dilatation of the left atrium  with findings suggestive of a degree of right cardiac dysfunction. Correlation with echocardiogram recommended. Electronically Signed   By: Elgie Collard M.D.   On: 10/22/2015 23:37   Ct Cervical Spine Wo Contrast  10/22/2015  CLINICAL DATA:  Pain following fall. Loss of consciousness after fall EXAM: CT HEAD WITHOUT CONTRAST CT CERVICAL SPINE WITHOUT CONTRAST TECHNIQUE: Multidetector CT imaging of the head and cervical spine was performed following the standard protocol without intravenous contrast. Multiplanar CT image reconstructions of the cervical spine were also generated. COMPARISON:  CT head February 01, 2011; CT cervical spine August 12, 2011 FINDINGS: CT HEAD FINDINGS The ventricles are normal in size and configuration for age. There is no intracranial mass, hemorrhage, extra-axial fluid collection, or midline shift. Gray-white compartments appear normal. No acute infarct evident. There is a the midline posterior parietal scalp hematoma. The bony calvarium appears intact. Mastoid air cells are clear. There is extensive opacification of most of the maxillary antra bilaterally. There is mild mucosal thickening in several ethmoid air cells bilaterally. Frontal sinuses are hypoplastic. No orbital lesions are evident. CT CERVICAL SPINE FINDINGS There is no fracture or spondylolisthesis. Prevertebral soft tissues and predental space regions are normal. The disc spaces appear unremarkable. There is no nerve root edema or effacement. No disc extrusion or stenosis. There is mild facet hypertrophy at several levels bilaterally. There is a dominant mass arising from the posterior aspect of the right lobe of the thyroid measuring 1.7 x 1.6 cm, essentially stable from 2013 study. There are foci of carotid artery calcification bilaterally. There is also calcification in the midportion of the right vertebral artery. IMPRESSION: CT: No intracranial mass, hemorrhage, or extra-axial fluid collection. Gray-white  compartments appear normal. There is a the fairly prominent posterior parietal scalp hematoma in the midline. No fracture evident. There is extensive maxillary sinus disease bilaterally with mild ethmoid sinus disease bilaterally. CT cervical spine: No fracture or spondylolisthesis. Mild osteoarthritic changes several levels without nerve  root edema or effacement. No disc extrusion or stenosis. Extensive arterial vascular calcification noted in both carotid arteries and in the right vertebral artery. There is a dominant mass arising from the posterior aspect the right lobe of the thyroid. This mass was present on prior study from 2013 and has remained stable. Electronically Signed   By: Bretta Bang III M.D.   On: 10/22/2015 19:38   Dg Chest Port 1 View  10/22/2015  CLINICAL DATA:  Cough and chest pain. EXAM: PORTABLE CHEST 1 VIEW COMPARISON:  03/04/2014 and prior exams FINDINGS: Cardiomegaly and mild peribronchial thickening again noted. A stable right middle lobe nodule is again noted. There is no evidence of focal airspace disease, pulmonary edema, suspicious pulmonary nodule/mass, pleural effusion, or pneumothorax. No acute bony abnormalities are identified. IMPRESSION: Cardiomegaly without evidence of acute cardiopulmonary disease. Electronically Signed   By: Harmon Pier M.D.   On: 10/22/2015 18:42    ASSESSMENT AND PLAN:    Principal Problem:   Syncope Active Problems:   CORONARY ATHEROSCLEROSIS NATIVE CORONARY ARTERY   Hyperlipidemia   Hypertension   Depression   GERD (gastroesophageal reflux disease)   Tobacco use disorder   Use of cannabis   COPD (chronic obstructive pulmonary disease) (HCC)   Elevated d-dimer   Hypotension   Leukocytosis   [name] is a 65 y.o. female with a history of HTN, CAD s/p remote PCI to pLAD, COPD, tobacco abuse, HTN, and HLD who was brought to [facility] ED via EMS on 10/22/15 after an episode of syncope. Her troponin was noted to be elevated and  cardiology was consulted.   Syncope: tele with some bradycardia with HRs in high 40s and frequent PVCs. Agree with 2D ECHO, this is pending. Patient has been very depressed since recent husbands passing and has had poor PO intake. She may be dehydrated. Apparently patient was very hypotensive upon EMS arrival w/ SBP in 60s. BP okay now.  Elevated troponin: low and flat c/w demand ischemia. She has a hx of CAD and ongoing heavy tobacco abuse as well as obesity, HTN and HLD. Will wait for 2D ECHO results. She will likely need some sort of ischemic work up.   Bilateral carotid bruits: will get carotid dopplers  CAD s/p remote stenting to LAD: continue ASA, statin. No BB due to bradycardia.   Heavy tobacco abuse: smokes 2 PPD. Not interested in quitting  Convulsions: patient's son says she has seizure like activity. Consider consulting neuro.   Elevated Ddimer: CTA neg for PE.   Right middle lobe and right lower lobe pulmonary nodules: Minimal interval increase since prior study in 2012  Signed: Cline Crock, PA-C 10/23/2015 1:51 PM  Pager [phone]  Co-Sign MD

## 2015-10-23 NOTE — ED Notes (Signed)
Admitting MD at bedside.

## 2015-10-23 NOTE — Progress Notes (Addendum)
Patient ID: [name], female   DOB: [birth year], 65 y.o.   MRN: [id number]  PROGRESS NOTE    [name]  WUJ:[id number] DOB: 10/11/1950 DOA: 10/22/2015  PCP: Pamelia Hoit, MD   Brief Narrative:   Patient admitted after midnight. For details please refer to admission note done 10/23/2015.  65 y.o. female with medical history significant of hypertension, CAD, stented coronary artery, hyperlipidemia, hypertension, colon polyps, GERD, depression, anxiety, tobacco use disorder who presented to [facility] status post syncopal event at home. Patient does not recall details of events prior to syncope. EMS was called by family members. Patient regained consciousness after a few minutes.  Patient was hemodynamically stable on the admission. No acute fractures noted on CT head and cervical spine. She was admitted for syncope evaluation. Her 12-lead EKG showed sinus rhythm with occasional PVC. The troponin level was 0.17 on the admission.  Assessment & Plan:  Syncope - Possible vasovagal - CT angiogram of the chest ruled out pulmonary embolism - Rule out cardiac causes - Troponin level 0.17, 0.16, the third troponin level is pending - 2-D echo is pending - 12-lead EKG showed sinus rhythm - Her urine drug screen was positive for [facility] - Appreciate cardiology consult and recommendations  Right middle lobe and right lower lobe pulmonary nodules - Minimal interval increase since prior study in 2012  Leukocytosis - No evidence of acute infectious process - White blood cell count subsequently normalized    DVT prophylaxis: SCDs bilaterally Code Status: full code  Family Communication: No family at the bedside Disposition Plan: Home likely 10/24/2015   Consultants:   Cardiology  Procedures:   2-D echo - pending  Antimicrobials:   None   Subjective: No overnight events.  Objective: Filed Vitals:   10/23/15 0015 10/23/15 0030 10/23/15 0139 10/23/15  0517  BP: 96/76 110/51 127/55 132/48  Pulse: 60 58 59 53  Temp:   98.2 F (36.8 C) 98.5 F (36.9 C)  TempSrc:   Oral Oral  Resp: 19 21 22 18   Height:      Weight:      SpO2: 99% 99% 94% 93%    Intake/Output Summary (Last 24 hours) at 10/23/15 1029 Last data filed at 10/22/15 2340  Gross per 24 hour  Intake   6000 ml  Output    500 ml  Net   5500 ml   Filed Weights   10/22/15 1810  Weight: 72.576 kg (160 lb)    Examination:  General exam: Appears calm and comfortable  Respiratory system: Clear to auscultation. Respiratory effort normal. Cardiovascular system: S1 & S2 heard, RRR. No JVD, (+) carotid bruit Gastrointestinal system: Abdomen is nondistended, soft and nontender. No organomegaly or masses felt. Normal bowel sounds heard. Central nervous system: Alert and oriented. No focal neurological deficits. Extremities: Symmetric 5 x 5 power. Skin: No rashes, lesions or ulcers Psychiatry: Judgement and insight appear normal. Mood & affect appropriate.   Data Reviewed: I have personally reviewed following labs and imaging studies  CBC:  Recent Labs Lab 10/22/15 1825 10/23/15 0754  WBC 22.4* 9.0  NEUTROABS 19.3* 5.5  HGB 11.9* 9.8*  HCT 37.9 31.3*  MCV 87.5 88.7  PLT 297 216   Basic Metabolic Panel:  Recent Labs Lab 10/22/15 1825 10/23/15 0754  NA 139 140  K 3.8 4.3  CL 108 115*  CO2 27 22  GLUCOSE 106* 80  BUN 10 10  CREATININE 0.70 0.64  CALCIUM 8.4* 7.6*   GFR:  Estimated Creatinine Clearance: 68.1 mL/min (by C-G formula based on Cr of 0.64). Liver Function Tests:  Recent Labs Lab 10/22/15 1825 10/23/15 0754  AST 21 19  ALT 16 14  ALKPHOS 56 42  BILITOT 0.3 0.5  PROT 6.6 5.3*  ALBUMIN 3.5 2.7*   No results for input(s): LIPASE, AMYLASE in the last 168 hours. No results for input(s): AMMONIA in the last 168 hours. Coagulation Profile: No results for input(s): INR, PROTIME in the last 168 hours. Cardiac Enzymes:  Recent Labs Lab  10/23/15 0235 10/23/15 0754  TROPONINI 0.17* 0.16*   BNP (last 3 results) No results for input(s): PROBNP in the last 8760 hours. HbA1C: No results for input(s): HGBA1C in the last 72 hours. CBG:  Recent Labs Lab 10/23/15 0637  GLUCAP 88   Lipid Profile: No results for input(s): CHOL, HDL, LDLCALC, TRIG, CHOLHDL, LDLDIRECT in the last 72 hours. Thyroid Function Tests: No results for input(s): TSH, T4TOTAL, FREET4, T3FREE, THYROIDAB in the last 72 hours. Anemia Panel: No results for input(s): VITAMINB12, FOLATE, FERRITIN, TIBC, IRON, RETICCTPCT in the last 72 hours. Urine analysis:    Component Value Date/Time   COLORURINE YELLOW 10/22/2015 1955   APPEARANCEUR CLEAR 10/22/2015 1955   LABSPEC 1.012 10/22/2015 1955   PHURINE 6.0 10/22/2015 1955   GLUCOSEU NEGATIVE 10/22/2015 1955   HGBUR TRACE* 10/22/2015 1955   BILIRUBINUR NEGATIVE 10/22/2015 1955   KETONESUR NEGATIVE 10/22/2015 1955   PROTEINUR NEGATIVE 10/22/2015 1955   UROBILINOGEN 0.2 03/04/2014 1039   NITRITE NEGATIVE 10/22/2015 1955   LEUKOCYTESUR NEGATIVE 10/22/2015 1955   Sepsis Labs: (procalcitonin:4,lacticidven:4)   )No results found for this or any previous visit (from the past 240 hour(s)).    Radiology Studies: Ct Head Wo Contrast 10/22/2015  No intracranial mass, hemorrhage, or extra-axial fluid collection. Gray-white compartments appear normal. There is a the fairly prominent posterior parietal scalp hematoma in the midline. No fracture evident. There is extensive maxillary sinus disease bilaterally with mild ethmoid sinus disease bilaterally. CT cervical spine: No fracture or spondylolisthesis. Mild osteoarthritic changes several levels without nerve root edema or effacement. No disc extrusion or stenosis. Extensive arterial vascular calcification noted in both carotid arteries and in the right vertebral artery. There is a dominant mass arising from the posterior aspect the right lobe of the  thyroid. This mass was present on prior study from 2013 and has remained stable.   Ct Angio Chest Pe W/cm &/or Wo Cm 10/22/2015  No CT evidence of pulmonary embolism. Right middle lobe and right lower lobe pulmonary nodules. There has been minimal interval increase in the size of the right middle lobe nodule since the study dated 2012. Mild dilatation of the left atrium with findings suggestive of a degree of right cardiac dysfunction. Correlation with echocardiogram recommended.  Ct Cervical Spine Wo Contrast 10/22/2015 No intracranial mass, hemorrhage, or extra-axial fluid collection. Gray-white compartments appear normal. There is a the fairly prominent posterior parietal scalp hematoma in the midline. No fracture evident. There is extensive maxillary sinus disease bilaterally with mild ethmoid sinus disease bilaterally. CT cervical spine: No fracture or spondylolisthesis. Mild osteoarthritic changes several levels without nerve root edema or effacement. No disc extrusion or stenosis. Extensive arterial vascular calcification noted in both carotid arteries and in the right vertebral artery. There is a dominant mass arising from the posterior aspect the right lobe of the thyroid. This mass was present on prior study from 2013 and has remained stable.   Dg Chest Port 1 View 10/22/2015  Cardiomegaly without evidence of acute cardiopulmonary disease.    Scheduled Meds: . aspirin  81 mg Oral Daily  . citalopram  20 mg Oral Daily  . iron polysaccharides  150 mg Oral Daily  . nicotine  21 mg Transdermal Daily  . pantoprazole  40 mg Oral Daily  . pravastatin  80 mg Oral Daily  . sodium chloride flush  3 mL Intravenous Q12H  . sodium chloride flush  3 mL Intravenous Q12H   Continuous Infusions: . 0.9 % NaCl with KCl 20 mEq / L 125 mL/hr at 10/23/15 0321        Time spent: 15 minutes  Greater than 50% of the time spent on counseling and coordinating the care.   Manson PasseyEVINE, Laronn Devonshire, MD [facility] Pager [phone]  If 7PM-7AM, please contact night-coverage [email or web address] Password [email or web address] 10/23/2015, 10:29 AM

## 2015-10-23 NOTE — ED Notes (Signed)
Attempted report 

## 2015-10-23 NOTE — H&P (Signed)
History and Physical    [name] GNF:[id number] DOB: [birth year] DOA: 10/22/2015  PCP: Pamelia Hoit, MD   Patient coming from: Home.  Chief Complaint: Syncope.  HPI: [name] is a 65 y.o. female with medical history significant of hypertension, CAD, stented coronary artery, hyperlipidemia, hypertension, colon polyps, GERD, depression, anxiety (prolonged grieving of her spouse's death), tobacco use disorder who was brought to the emergency department via EMS due to above complaint.  Per patient, she was in her kitchen getting a bowl of cereal when she suddenly passed out, falling on the floor and hitting the back of her head. She loss consciousness for a few minutes. EMS was called by family members and on arrival they found the patient to be alert and oriented, but hypotensive and weak. She was anxious and did not want to be brought to the emergency department, but subsequently agreed. While she was getting in the ambulance she had one episode of emesis. She states that she only remembers getting lightheaded, but denies vision changes, chest pain, dyspnea, palpitations, diaphoresis or nausea prior to her fall. She states that she has been having some postural dizziness recently, but not as intense as today.   ED Course: She is currently in no acute distress. The patient received multiple IV fluid boluses and supplemental oxygen experiencing normalization of her blood pressure and oxygen saturation percentage.  Review of Systems: As per HPI otherwise 10 point review of systems negative.    Past Medical History  Diagnosis Date  . Hypertension   . Stented coronary artery   . CAD (coronary artery disease)   . HTN (hypertension)   . HLD (hyperlipidemia)   . Colon polyps   . Esophageal reflux     Past Surgical History  Procedure Laterality Date  . Appendectomy    . Tumor removal      ovarian  . Back surgery    . Cardiac catheterization  2006     reports that  she has been smoking Cigarettes.  She does not have any smokeless tobacco history on file. She reports that she does not drink alcohol or use illicit drugs.  Allergies  Allergen Reactions  . Codeine     REACTION: nausea--if she does not eat  . Morphine     REACTION: nausea    History reviewed. No pertinent family history.   Prior to Admission medications   Medication Sig Start Date End Date Taking? Authorizing Provider  citalopram (CELEXA) 20 MG tablet Take 20 mg by mouth daily. 09/09/15  Yes Historical Provider, MD  diazepam (VALIUM) 5 MG tablet Take 1 tablet by mouth 3 (three) times daily as needed for anxiety.  02/19/14  Yes Historical Provider, MD  HYDROcodone-acetaminophen (NORCO) 10-325 MG per tablet Take 1 tablet by mouth every 6 (six) hours as needed for moderate pain.  10/30/11  Yes Historical Provider, MD  iron polysaccharides (FERREX 150) 150 MG capsule Take 150 mg by mouth daily. 09/18/15  Yes Historical Provider, MD  lisinopril (PRINIVIL,ZESTRIL) 10 MG tablet Take 10 mg by mouth daily.   Yes Historical Provider, MD  meloxicam (MOBIC) 15 MG tablet Take 15 mg by mouth daily as needed (for muscle or joint pain).  07/31/15  Yes Historical Provider, MD  NITROSTAT 0.4 MG SL tablet Place 0.4 mg under the tongue every 5 (five) minutes as needed for chest pain.  01/05/14  Yes Historical Provider, MD  omeprazole (PRILOSEC) 20 MG capsule Take 20 mg by mouth daily. 10/02/15  Yes Historical Provider, MD  pravastatin (PRAVACHOL) 80 MG tablet Take 80 mg by mouth daily. 10/02/15  Yes Historical Provider, MD  aspirin 81 MG tablet Take 81 mg by mouth daily.    Historical Provider, MD  Cholecalciferol (VITAMIN D PO) Take 1 capsule by mouth daily.    Historical Provider, MD  esomeprazole (NEXIUM) 40 MG capsule Take 40 mg by mouth daily at 12 noon.    Historical Provider, MD  losartan (COZAAR) 25 MG tablet Take 25 mg by mouth daily.    Historical Provider, MD  metoprolol (LOPRESSOR) 50 MG tablet Take 50  mg by mouth daily.     Historical Provider, MD    Physical Exam: Filed Vitals:   10/22/15 2215 10/22/15 2230 10/22/15 2327 10/22/15 2345  BP: 115/44 106/61 115/58 120/46  Pulse: 73 63 57 57  Temp:      TempSrc:      Resp: 23 21 23 20   Height:      Weight:      SpO2: 91% 97% 96% 100%      Constitutional: NAD, calm, comfortable Filed Vitals:   10/22/15 2215 10/22/15 2230 10/22/15 2327 10/22/15 2345  BP: 115/44 106/61 115/58 120/46  Pulse: 73 63 57 57  Temp:      TempSrc:      Resp: 23 21 23 20   Height:      Weight:      SpO2: 91% 97% 96% 100%   Eyes: PERRL, lids and conjunctivae normal ENMT: Mucous membranes are moist. Posterior pharynx clear of any exudate or lesions. Neck: normal, supple, no masses, no thyromegaly Respiratory: Mild wheezing and rhonchi bilaterally, no crackles. Normal respiratory effort. No accessory muscle use.  Cardiovascular: Regular rate and rhythm, no murmurs / rubs / gallops. No extremity edema. 2+ pedal pulses. Positive left carotid bruit.  Abdomen: Obese. Bowel sounds positive. Soft, no tenderness, no masses palpated. No hepatosplenomegaly.  Musculoskeletal: no clubbing / cyanosis. Good ROM, no contractures. Normal muscle tone.  Skin: no rashes, lesions, ulcers. No induration Neurologic: CN 2-12 grossly intact. Sensation intact, DTR normal. Strength 5/5 in all 4.  Psychiatric: Normal judgment and insight. Alert and oriented x 4. Mood is depressed.   Labs on Admission: I have personally reviewed following labs and imaging studies  CBC:  Recent Labs Lab 10/22/15 1825  WBC 22.4*  NEUTROABS 19.3*  HGB 11.9*  HCT 37.9  MCV 87.5  PLT 297   Basic Metabolic Panel:  Recent Labs Lab 10/22/15 1825  NA 139  K 3.8  CL 108  CO2 27  GLUCOSE 106*  BUN 10  CREATININE 0.70  CALCIUM 8.4*   GFR: Estimated Creatinine Clearance: 68.1 mL/min (by C-G formula based on Cr of 0.7). Liver Function Tests:  Recent Labs Lab 10/22/15 1825  AST 21    ALT 16  ALKPHOS 56  BILITOT 0.3  PROT 6.6  ALBUMIN 3.5   Urine analysis:    Component Value Date/Time   COLORURINE YELLOW 10/22/2015 1955   APPEARANCEUR CLEAR 10/22/2015 1955   LABSPEC 1.012 10/22/2015 1955   PHURINE 6.0 10/22/2015 1955   GLUCOSEU NEGATIVE 10/22/2015 1955   HGBUR TRACE* 10/22/2015 1955   BILIRUBINUR NEGATIVE 10/22/2015 1955   KETONESUR NEGATIVE 10/22/2015 1955   PROTEINUR NEGATIVE 10/22/2015 1955   UROBILINOGEN 0.2 03/04/2014 1039   NITRITE NEGATIVE 10/22/2015 1955   LEUKOCYTESUR NEGATIVE 10/22/2015 1955    Radiological Exams on Admission: Ct Head Wo Contrast  10/22/2015  CLINICAL DATA:  Pain following fall. Loss  of consciousness after fall EXAM: CT HEAD WITHOUT CONTRAST CT CERVICAL SPINE WITHOUT CONTRAST TECHNIQUE: Multidetector CT imaging of the head and cervical spine was performed following the standard protocol without intravenous contrast. Multiplanar CT image reconstructions of the cervical spine were also generated. COMPARISON:  CT head February 01, 2011; CT cervical spine August 12, 2011 FINDINGS: CT HEAD FINDINGS The ventricles are normal in size and configuration for age. There is no intracranial mass, hemorrhage, extra-axial fluid collection, or midline shift. Gray-white compartments appear normal. No acute infarct evident. There is a the midline posterior parietal scalp hematoma. The bony calvarium appears intact. Mastoid air cells are clear. There is extensive opacification of most of the maxillary antra bilaterally. There is mild mucosal thickening in several ethmoid air cells bilaterally. Frontal sinuses are hypoplastic. No orbital lesions are evident. CT CERVICAL SPINE FINDINGS There is no fracture or spondylolisthesis. Prevertebral soft tissues and predental space regions are normal. The disc spaces appear unremarkable. There is no nerve root edema or effacement. No disc extrusion or stenosis. There is mild facet hypertrophy at several levels bilaterally.  There is a dominant mass arising from the posterior aspect of the right lobe of the thyroid measuring 1.7 x 1.6 cm, essentially stable from 2013 study. There are foci of carotid artery calcification bilaterally. There is also calcification in the midportion of the right vertebral artery. IMPRESSION: CT: No intracranial mass, hemorrhage, or extra-axial fluid collection. Gray-white compartments appear normal. There is a the fairly prominent posterior parietal scalp hematoma in the midline. No fracture evident. There is extensive maxillary sinus disease bilaterally with mild ethmoid sinus disease bilaterally. CT cervical spine: No fracture or spondylolisthesis. Mild osteoarthritic changes several levels without nerve root edema or effacement. No disc extrusion or stenosis. Extensive arterial vascular calcification noted in both carotid arteries and in the right vertebral artery. There is a dominant mass arising from the posterior aspect the right lobe of the thyroid. This mass was present on prior study from 2013 and has remained stable. Electronically Signed   By: Bretta Bang III M.D.   On: 10/22/2015 19:38   Ct Angio Chest Pe W/cm &/or Wo Cm  10/22/2015  CLINICAL DATA:  65 year old female with shorts of breath and elevated D-dimers EXAM: CT ANGIOGRAPHY CHEST WITH CONTRAST TECHNIQUE: Multidetector CT imaging of the chest was performed using the standard protocol during bolus administration of intravenous contrast. Multiplanar CT image reconstructions and MIPs were obtained to evaluate the vascular anatomy. CONTRAST:  100 cc Isovue 370 COMPARISON:  Chest radiograph dated 10/22/2015 and CT dated 02/01/2011 FINDINGS: There is mild diffuse interstitial prominence concerning for mild interstitial edema. There is no focal consolidation. No pleural effusion or pneumothorax. The central airways are patent. There is mild thickening of the bronchial wall. Clinical correlation is recommended to evaluate for bronchitis.  There is a 9 mm nodule in the right middle lobe which appears minimally increased compared to the CT dated 2012. A stable appearing 11 mm nodular density is noted in the right lower lobe. There is atherosclerotic calcification of the thoracic aorta and aortic arch. There is no aortic dissection or aneurysm. No CT evidence of pulmonary embolism. There is mild dilatation of the left atrium as well as apparent circumferential thickening of the left ventricular wall. Correlation with echocardiogram recommended. There is no pericardial effusion. There is coronary vascular calcification. There is no hilar or mediastinal adenopathy. Small hiatal hernia. The esophagus is grossly unremarkable. There is no axillary adenopathy. The chest wall soft tissues  appear unremarkable. There is degenerative changes of the spine. No acute fracture. Multiple small hepatic hypodense lesions are not characterized on this study. There is retrograde flow of contrast into the IVC indicative of a degree of right cardiac dysfunction. Review of the MIP images confirms the above findings. IMPRESSION: No CT evidence of pulmonary embolism. Right middle lobe and right lower lobe pulmonary nodules. There has been minimal interval increase in the size of the right middle lobe nodule since the study dated 2012. Mild dilatation of the left atrium with findings suggestive of a degree of right cardiac dysfunction. Correlation with echocardiogram recommended. Electronically Signed   By: Elgie Collard M.D.   On: 10/22/2015 23:37   Ct Cervical Spine Wo Contrast  10/22/2015  CLINICAL DATA:  Pain following fall. Loss of consciousness after fall EXAM: CT HEAD WITHOUT CONTRAST CT CERVICAL SPINE WITHOUT CONTRAST TECHNIQUE: Multidetector CT imaging of the head and cervical spine was performed following the standard protocol without intravenous contrast. Multiplanar CT image reconstructions of the cervical spine were also generated. COMPARISON:  CT head  February 01, 2011; CT cervical spine August 12, 2011 FINDINGS: CT HEAD FINDINGS The ventricles are normal in size and configuration for age. There is no intracranial mass, hemorrhage, extra-axial fluid collection, or midline shift. Gray-white compartments appear normal. No acute infarct evident. There is a the midline posterior parietal scalp hematoma. The bony calvarium appears intact. Mastoid air cells are clear. There is extensive opacification of most of the maxillary antra bilaterally. There is mild mucosal thickening in several ethmoid air cells bilaterally. Frontal sinuses are hypoplastic. No orbital lesions are evident. CT CERVICAL SPINE FINDINGS There is no fracture or spondylolisthesis. Prevertebral soft tissues and predental space regions are normal. The disc spaces appear unremarkable. There is no nerve root edema or effacement. No disc extrusion or stenosis. There is mild facet hypertrophy at several levels bilaterally. There is a dominant mass arising from the posterior aspect of the right lobe of the thyroid measuring 1.7 x 1.6 cm, essentially stable from 2013 study. There are foci of carotid artery calcification bilaterally. There is also calcification in the midportion of the right vertebral artery. IMPRESSION: CT: No intracranial mass, hemorrhage, or extra-axial fluid collection. Gray-white compartments appear normal. There is a the fairly prominent posterior parietal scalp hematoma in the midline. No fracture evident. There is extensive maxillary sinus disease bilaterally with mild ethmoid sinus disease bilaterally. CT cervical spine: No fracture or spondylolisthesis. Mild osteoarthritic changes several levels without nerve root edema or effacement. No disc extrusion or stenosis. Extensive arterial vascular calcification noted in both carotid arteries and in the right vertebral artery. There is a dominant mass arising from the posterior aspect the right lobe of the thyroid. This mass was present  on prior study from 2013 and has remained stable. Electronically Signed   By: Bretta Bang III M.D.   On: 10/22/2015 19:38   Dg Chest Port 1 View  10/22/2015  CLINICAL DATA:  Cough and chest pain. EXAM: PORTABLE CHEST 1 VIEW COMPARISON:  03/04/2014 and prior exams FINDINGS: Cardiomegaly and mild peribronchial thickening again noted. A stable right middle lobe nodule is again noted. There is no evidence of focal airspace disease, pulmonary edema, suspicious pulmonary nodule/mass, pleural effusion, or pneumothorax. No acute bony abnormalities are identified. IMPRESSION: Cardiomegaly without evidence of acute cardiopulmonary disease. Electronically Signed   By: Harmon Pier M.D.   On: 10/22/2015 18:42    EKG: Independently reviewed. Vent. rate 64 BPM PR interval 190  ms QRS duration 104 ms QT/QTc 447/461 ms P-R-T axes 75 49 48 Sinus rhythm Ventricular premature complex  Assessment/Plan Principal Problem:   Syncope Admit to telemetry/observation. Continue supplemental oxygen. Trend troponin levels. Check echocardiogram. CT of cervical spine shows extensive arterial vascular calcification noted in both carotid arteries and in the right vertebral artery. Check carotid Doppler.   Active Problems:   Leukocytosis No obvious source of infection at this time. There might be a degree of hemoconcentration and acute stress, but WBC count is significantly elevated. I will hold antibiotic therapy at this time. Monitor clinically for any signs of obvious infection. Recheck WBC a.m. Blood cultures were drawn in the emergency department. Please follow up.    Elevated d-dimer No pulmonary embolism on CT chest angiogram. Syncope and the presence of the scalp hematoma mild increase the d-dimer, but doubt such a high level in such a short period of time. Also concerned for malignancy given the patient's heavy tobacco use. Given the presence of the scalp hematoma at this time, I will defer the use of  Lovenox/heparin for DVT prophylaxis. Repeat level in a.m.    CORONARY ATHEROSCLEROSIS NATIVE CORONARY ARTERY Continue daily aspirin. Continue pravastatin. Resume beta blocker once the patient's blood pressure returns to baseline.    Hyperlipidemia Continue pravastatin 80 mg by mouth daily. Monitor LFTs periodically.    Hypotension Hold lisinopril, losartan and metoprolol. Continue IV fluids. Monitor blood pressure.      Hypertension Hold antihypertensives for now.    GERD (gastroesophageal reflux disease) Continue proton pump inhibitor.    Depression Very depressed despite treatment with Celexa 20 mg by mouth daily. And diazepam 5 mg by mouth 3 times a day as needed for anxiety. Consider psych evaluation in the morning. Social services evaluation.    Use of cannabis The patient denies recreational drug use (family members were present when asked).  Consider psych evaluation in a.m.    Tobacco use disorder She used to smoke one PPD, now family members stay the she is smoking 2-1/2 packs of cigarettes per day after her husband passed away. Nicotine replacement therapy ordered.   DVT prophylaxis: SCDs due to scalp hematoma. Code Status: Full code. Family Communication: Her daughter Roswell Miners was present in the room. She works across the street from the hospital and would like to be updated by dayshift. Please contact at [phone].  Disposition Plan: Observation for 24-48 hours for syncope workup and further evaluation. Consults called: Social services and case management. Admission status: Observation/telemetry.   Bobette Mo MD [facility] Pager [phone]  If 7PM-7AM, please contact night-coverage [email or web address] Password [email or web address]  10/23/2015, 12:14 AM

## 2015-10-23 NOTE — Care Management Obs Status (Signed)
MEDICARE OBSERVATION STATUS NOTIFICATION   Patient Details  Name: [name] MRN: [id number] Date of Birth: 01-25-1951   Medicare Observation Status Notification Given:  Yes    Darrold SpanWebster, [place] Gl[place] Hall, RN 10/23/2015, 4:29 PM

## 2015-10-24 ENCOUNTER — Observation Stay (HOSPITAL_BASED_OUTPATIENT_CLINIC_OR_DEPARTMENT_OTHER): Payer: Medicare Other

## 2015-10-24 DIAGNOSIS — I2510 Atherosclerotic heart disease of native coronary artery without angina pectoris: Secondary | ICD-10-CM | POA: Diagnosis not present

## 2015-10-24 DIAGNOSIS — R0989 Other specified symptoms and signs involving the circulatory and respiratory systems: Secondary | ICD-10-CM | POA: Diagnosis not present

## 2015-10-24 DIAGNOSIS — R55 Syncope and collapse: Secondary | ICD-10-CM | POA: Diagnosis not present

## 2015-10-24 DIAGNOSIS — E785 Hyperlipidemia, unspecified: Secondary | ICD-10-CM | POA: Diagnosis not present

## 2015-10-24 DIAGNOSIS — R0789 Other chest pain: Secondary | ICD-10-CM | POA: Diagnosis not present

## 2015-10-24 DIAGNOSIS — R791 Abnormal coagulation profile: Secondary | ICD-10-CM | POA: Diagnosis not present

## 2015-10-24 LAB — GLUCOSE, CAPILLARY: Glucose-Capillary: 87 mg/dL (ref 65–99)

## 2015-10-24 LAB — ECHOCARDIOGRAM COMPLETE
HEIGHTINCHES: 65 in
Weight: 2763.2 oz

## 2015-10-24 MED ORDER — POLYETHYLENE GLYCOL 3350 17 G PO PACK
17.0000 g | PACK | Freq: Every day | ORAL | Status: DC
  Administered 2015-10-24: 17 g via ORAL
  Filled 2015-10-24: qty 1

## 2015-10-24 MED ORDER — ZOLPIDEM TARTRATE 5 MG PO TABS
5.0000 mg | ORAL_TABLET | Freq: Once | ORAL | Status: AC
  Administered 2015-10-24: 5 mg via ORAL
  Filled 2015-10-24: qty 1

## 2015-10-24 MED ORDER — SENNOSIDES-DOCUSATE SODIUM 8.6-50 MG PO TABS
1.0000 | ORAL_TABLET | Freq: Two times a day (BID) | ORAL | Status: DC
  Administered 2015-10-24 – 2015-10-26 (×4): 1 via ORAL
  Filled 2015-10-24 (×4): qty 1

## 2015-10-24 NOTE — Progress Notes (Signed)
VASCULAR LAB PRELIMINARY  PRELIMINARY  PRELIMINARY  PRELIMINARY  Carotid duplex completed.    Preliminary report:  Bilateral :  40-59% internal carotid artery stenosis.  Doppler waveforms are abnormal throughout with moderate heterogeneous and calcific plaque throughout. Vertebral artery flow is antegrade.  Gaylynn Seiple, RVS 10/24/2015, 4:39 PM

## 2015-10-24 NOTE — Progress Notes (Signed)
   PATIENT ID: Ms. Sallee ProvencalKozlovsky is a [age]F with CAD s/p PCI and 95% DI not amenable to intervention, hypertension, hyperlipidemia and ongoing tobacco abuse (2.5 ppd), here with syncope and chest pain.  SUBJECTIVE:  Continues to have pain in the R lower ribs.    PHYSICAL EXAM Filed Vitals:   10/23/15 2235 10/23/15 2252 10/24/15 0550 10/24/15 0552  BP:  116/68  152/81  Pulse: 75   72  Temp: 98.8 F (37.1 C)   98 F (36.7 C)  TempSrc: Oral   Oral  Resp: 20   20  Height:      Weight:   172 lb 11.2 oz (78.336 kg)   SpO2:    94%   Gen: Chronically ill-appearing. No acute distress Neck: No JVD COR: RRR. No m/r/g. Normal S1/S2  Lungs: Mild expiratory wheezeing  Abd: Soft, NT, ND. +BS Ext: WWP. No edema.   LABS: Lab Results  Component Value Date   TROPONINI 0.12* 10/23/2015   Results for orders placed or performed during the hospital encounter of 10/22/15 (from the past 24 hour(s))  Glucose, capillary     Status: None   Collection Time: 10/24/15  6:00 AM  Result Value Ref Range   Glucose-Capillary 87 65 - 99 mg/dL    Intake/Output Summary (Last 24 hours) at 10/24/15 1242 Last data filed at 10/24/15 0552  Gross per 24 hour  Intake      0 ml  Output    600 ml  Net   -600 ml    Telemetry: sinus rhythm.  Occasional PVCs.   ASSESSMENT AND PLAN:  Principal Problem:   Syncope Active Problems:   CORONARY ATHEROSCLEROSIS NATIVE CORONARY ARTERY   Hyperlipidemia   Hypertension   Depression   GERD (gastroesophageal reflux disease)   Tobacco use disorder   Use of cannabis   COPD (chronic obstructive pulmonary disease) (HCC)   Elevated d-dimer   Hypotension   Leukocytosis    Syncope: Echo and carotid Dopplers have been completed but not read.  No events on telemetry.  [facility] if she agrees.   Elevated troponin: CAD s/p remote stenting to LAD:  Likely demand ischemia.  However, she had a run of ventricular bigeminy and trigeminy on  admission.  [facility]. Continue aspirin and statin.   No BB due to bradycardia.   Bilateral carotid bruits: Carotid Dopplers pending.  Continue aspirin and statin.   Heavy tobacco abuse: smokes 2 PPD. Not interested in quitting  Elevated Ddimer: CTA neg for PE.   Right middle lobe and right lower lobe pulmonary nodules: Minimal interval increase since prior study in 2012    Arissa Fagin C. Duke Salviaandolph, MD, [facility] 10/24/2015 12:42 PM

## 2015-10-24 NOTE — Evaluation (Signed)
Physical Therapy Evaluation Patient Details Name: Jasmine HouseholderBrenda A Leachman MRN: [id number] DOB: 28-Jun-1950 Today's Date: 10/24/2015   History of Present Illness  Pt adm with syncope. PMH - HTN, CAD, depression/anxiety,   Clinical Impression  Pt admitted with above diagnosis and presents to PT with functional limitations due to deficits listed below (See PT problem list). Pt needs skilled PT to maximize independence and safety to allow discharge to home. Primary limitation is her light headedness. BP was 142/49 sitting on commode when pt felt light headed. If this improves her functional activity tolerance will improve. Will follow acutely but do not feel she will need PT after DC.    Follow Up Recommendations No PT follow up    Equipment Recommendations  Rolling walker with 5" wheels    Recommendations for Other Services       Precautions / Restrictions Precautions Precautions: Fall Restrictions Weight Bearing Restrictions: No      Mobility  Bed Mobility Overal bed mobility: Modified Independent                Transfers Overall transfer level: Modified independent Equipment used: Rolling walker (2 wheeled)                Ambulation/Gait Ambulation/Gait assistance: Supervision Ambulation Distance (Feet): 15 Feet (x 2) Assistive device: Rolling walker (2 wheeled) Gait Pattern/deviations: Step-through pattern;Decreased step length - right;Decreased step length - left;Shuffle Gait velocity: decr Gait velocity interpretation: Below normal speed for age/gender General Gait Details: Pt reported feeling light headed when she got to the bathroom. BP - 142/49. Able to amb back to bed . Continued to feel light headed and nauseous. Nursing present.  Stairs            Wheelchair Mobility    Modified Rankin (Stroke Patients Only)       Balance Overall balance assessment: Needs assistance Sitting-balance support: No upper extremity supported;Feet supported Sitting  balance-Leahy Scale: Good     Standing balance support: No upper extremity supported Standing balance-Leahy Scale: Fair                               Pertinent Vitals/Pain Pain Assessment: Faces Faces Pain Scale: Hurts little more Pain Location: back Pain Descriptors / Indicators: Grimacing Pain Intervention(s): Limited activity within patient's tolerance;Monitored during session    Home Living Family/patient expects to be discharged to:: Private residence Living Arrangements: Children Available Help at Discharge: Family Type of Home: House Home Access: Level entry     Home Layout: One level Home Equipment: None      Prior Function Level of Independence: Independent         Comments: Sedentary     Hand Dominance        Extremity/Trunk Assessment   Upper Extremity Assessment: Overall WFL for tasks assessed           Lower Extremity Assessment: Overall WFL for tasks assessed         Communication   Communication: No difficulties  Cognition Arousal/Alertness: Awake/alert Behavior During Therapy: WFL for tasks assessed/performed Overall Cognitive Status: Within Functional Limits for tasks assessed                      General Comments      Exercises        Assessment/Plan    PT Assessment Patient needs continued PT services  PT Diagnosis Difficulty walking   PT Problem List  Decreased mobility;Decreased balance  PT Treatment Interventions DME instruction;Gait training;Functional mobility training;Therapeutic activities;Balance training;Therapeutic exercise;Patient/family education   PT Goals (Current goals can be found in the Care Plan section) Acute Rehab PT Goals Patient Stated Goal: go home PT Goal Formulation: With patient Time For Goal Achievement: 10/31/15 Potential to Achieve Goals: Good    Frequency Min 3X/week   Barriers to discharge        Co-evaluation               End of Session Equipment  Utilized During Treatment: Gait belt Activity Tolerance: Treatment limited secondary to medical complications (Comment) (light headed) Patient left: in bed;with call bell/phone within reach;with nursing/sitter in room Nurse Communication: Mobility status;Other (comment) (light headed)    Functional Assessment Tool Used: clinical judgement Functional Limitation: Mobility: Walking and moving around Mobility: Walking and Moving Around Current Status [phone]): At least 20 percent but less than 40 percent impaired, limited or restricted Mobility: Walking and Moving Around Goal Status [phone]): At least 1 percent but less than 20 percent impaired, limited or restricted    Time: 1040-1055 PT Time Calculation (min) (ACUTE ONLY): 15 min   Charges:   PT Evaluation $PT Eval Moderate Complexity: 1 Procedure     PT G Codes:   PT G-Codes **NOT FOR INPATIENT CLASS** Functional Assessment Tool Used: clinical judgement Functional Limitation: Mobility: Walking and moving around Mobility: Walking and Moving Around Current Status (U9811): At least 20 percent but less than 40 percent impaired, limited or restricted Mobility: Walking and Moving Around Goal Status [phone]): At least 1 percent but less than 20 percent impaired, limited or restricted    [facility] 10/24/2015, 11:34 AM [facility] PT [phone]

## 2015-10-24 NOTE — Clinical Social Work Note (Signed)
CSW received consult. CSW met with patient. Patient reported she would like to have outpatient therapy to process the loss of her husband. CSW provided patient with list of outpatient resources for therapy.   [employer], LCSW [phone]

## 2015-10-24 NOTE — Progress Notes (Signed)
Talked with pt who stated she would go ahead with stress test. Nada BoozerLaura Ingold PA paged per Dr. Duke Salviaandolph. Awaiting call back. Will continue to monitor. Francee Piccoloerek RCharity fundraiser

## 2015-10-24 NOTE — Progress Notes (Signed)
Echocardiogram 2D Echocardiogram has been performed.  [name], [name] M 10/24/2015, 11:05 AM

## 2015-10-24 NOTE — Progress Notes (Addendum)
Patient ID: [name], female   DOB: 1950/11/20, 65 y.o.   MRN: [id number]  PROGRESS NOTE    [name]  AOZ:[id number] DOB: 07-10-1950 DOA: 10/22/2015  PCP: Pamelia Hoit, MD   Brief Narrative:   65 y.o. female with medical history significant of hypertension, CAD, stented coronary artery, hyperlipidemia, hypertension, colon polyps, GERD, depression, anxiety, tobacco use disorder who presented to [facility] status post syncopal event at home. Patient does not recall details of events prior to syncope. EMS was called by family members. Patient regained consciousness after a few minutes.  Patient was hemodynamically stable on the admission. No acute fractures noted on CT head and cervical spine. She was admitted for syncope evaluation. Her 12-lead EKG showed sinus rhythm with occasional PVC. The troponin level was 0.17 on the admission.  Assessment & Plan:  Syncope - Possible vasovagal vs secondary to orthostatic hypotension - we have checked orthostatics this AM and SBP dropped from 80's --> 60's with change from sitting to standing  - CT angiogram of the chest ruled out pulmonary embolism - Troponin levels 0.17, 0.16, 0.12 - 2-D echo pending  - Her urine drug screen was positive for [facility] - Appreciate cardiology consult and recommendations, plan for Myoview test in AM - pt agreeable to proceed with stress test  Right middle lobe and right lower lobe pulmonary nodules - Minimal interval increase since prior study in 2012 - can be followed up in an outpatient setting   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  Objective: Filed Vitals:   10/23/15 2235 10/23/15 2252 10/24/15 0550 10/24/15 0552  BP:  116/68  152/81  Pulse: 75   72  Temp: 98.8 F (37.1 C)   98 F (36.7 C)  TempSrc: Oral   Oral  Resp: 20   20  Height:      Weight:   78.336 kg (172 lb 11.2 oz)   SpO2:    94%    Intake/Output Summary (Last 24 hours) at 10/24/15 0634 Last data filed at 10/24/15 0552  Gross per 24 hour  Intake    200 ml  Output    800 ml  Net   -600 ml   Filed Weights   10/22/15 1810 10/23/15 1634 10/24/15 0550  Weight: 72.576 kg (160 lb) 77.565 kg (171 lb) 78.336 kg (172 lb 11.2 oz)    Examination:  General exam: Appears calm and comfortable  Respiratory system: Clear to auscultation. Respiratory effort normal. Cardiovascular system: S1 & S2 heard, RRR. No JVD, (+) carotid bruit Gastrointestinal system: Abdomen is nondistended, soft and nontender.  Central nervous system: Alert and oriented. No focal neurological deficits.  Data Reviewed: I have personally reviewed following labs and imaging studies  CBC:  Recent Labs Lab 10/22/15 1825 10/23/15 0754  WBC 22.4* 9.0  NEUTROABS 19.3* 5.5  HGB 11.9* 9.8*  HCT 37.9 31.3*  MCV 87.5 88.7  PLT 297 216   Basic Metabolic Panel:  Recent Labs Lab 10/22/15 1825 10/23/15 0754  NA 139 140  K 3.8 4.3  CL 108 115*  CO2 27 22  GLUCOSE 106* 80  BUN 10 10  CREATININE 0.70 0.64  CALCIUM 8.4* 7.6*   Liver Function Tests:  Recent Labs Lab 10/22/15 1825 10/23/15 0754  AST 21 19  ALT 16 14  ALKPHOS 56 42  BILITOT 0.3 0.5  PROT 6.6 5.3*  ALBUMIN 3.5 2.7*   Cardiac Enzymes:  Recent Labs Lab 10/23/15 0235 10/23/15 0754 10/23/15 1043  TROPONINI 0.17* 0.16* 0.12*   CBG:  Recent Labs Lab 10/23/15 0637 10/24/15 0600  GLUCAP 88 87   Recent Results (from the past 240 hour(s))  Blood Culture (routine x 2)      Status: None (Preliminary result)   Collection Time: 10/22/15  6:35 PM  Result Value Ref Range Status   Specimen Description BLOOD RIGHT HAND  Final   Special Requests IN PEDIATRIC BOTTLE 2CC  Final   Culture NO GROWTH < 24 HOURS  Final   Report Status PENDING  Incomplete  Blood Culture (routine x 2)     Status: None (Preliminary result)   Collection Time: 10/22/15  6:42 PM  Result Value Ref Range Status   Specimen Description BLOOD LEFT HAND  Final   Special Requests BOTTLES DRAWN AEROBIC ONLY 7CC  Final   Culture NO GROWTH < 24 HOURS  Final   Report Status PENDING  Incomplete  Urine culture     Status: Abnormal   Collection Time: 10/22/15  7:55 PM  Result Value Ref Range Status   Specimen Description URINE, RANDOM  Final   Special Requests NONE  Final   Culture <10,000 COLONIES/mL INSIGNIFICANT GROWTH (A)  Final   Report Status 10/23/2015 FINAL  Final      Radiology Studies: Ct Head Wo Contrast 10/22/2015  No intracranial mass, hemorrhage, or extra-axial fluid collection. Gray-white compartments appear normal. There is a the fairly prominent posterior parietal scalp hematoma in the midline. No fracture evident. There is extensive maxillary sinus disease bilaterally with mild ethmoid sinus disease bilaterally. CT cervical spine: No fracture or spondylolisthesis. Mild osteoarthritic changes several levels without nerve root edema or effacement. No disc extrusion or stenosis. Extensive arterial vascular calcification noted in both carotid arteries and in the right vertebral artery. There is a dominant mass arising from the posterior aspect the right lobe of the thyroid. This mass was present on prior study from 2013 and has remained stable.   Ct Angio Chest Pe W/cm &/or Wo Cm 10/22/2015  No CT evidence of pulmonary embolism. Right middle lobe and right lower lobe pulmonary nodules. There has been minimal interval increase in the size of the right middle lobe nodule since the study dated 2012.  Mild dilatation of the left atrium with findings suggestive of a degree of right cardiac dysfunction. Correlation with echocardiogram recommended.  Ct Cervical Spine Wo Contrast 10/22/2015 No intracranial mass, hemorrhage, or extra-axial fluid collection. Gray-white compartments appear normal. There is a the fairly prominent posterior parietal scalp hematoma in the midline. No fracture evident. There is extensive maxillary sinus disease bilaterally with mild ethmoid sinus disease bilaterally. CT cervical spine: No fracture or spondylolisthesis. Mild osteoarthritic changes several levels without nerve root edema or effacement. No disc extrusion or stenosis. Extensive arterial vascular calcification noted in both carotid arteries and in the right vertebral artery. There is a dominant mass arising from the posterior aspect the right lobe of the thyroid. This mass was present on prior study from 2013 and has remained stable.   Dg  Chest Port 1 View 10/22/2015  Cardiomegaly without evidence of acute cardiopulmonary disease.    Scheduled Meds: . aspirin  81 mg Oral Daily  . citalopram  20 mg Oral Daily  . iron polysaccharides  150 mg Oral Daily  . lidocaine  1 patch Transdermal Q24H  . nicotine  21 mg Transdermal Daily  . pantoprazole  40 mg Oral Daily  . pravastatin  80 mg Oral Daily  . sodium chloride flush  3 mL Intravenous Q12H  . sodium chloride flush  3 mL Intravenous Q12H   Continuous Infusions: . 0.9 % NaCl with KCl 20 mEq / L 125 mL/hr at 10/23/15 2353    Time spent: 15 minutes  Greater than 50% of the time spent on counseling and coordinating the care.  Debbora Presto, MD [facility] Pager [phone]  If 7PM-7AM, please contact night-coverage [email or web address] Password [facility] 10/24/2015, 6:34 AM

## 2015-10-24 NOTE — Progress Notes (Signed)
D/w nurse earlier to page me when patient reached a decision regarding stress test - [name] was still undecided as of 2:30. I see note hat patient has decided to proceed. Nada BoozerLaura Ingold received the follow-up call - nuc has been ordered. Keep NPO after midnight. Dayna Dunn PA-C

## 2015-10-25 ENCOUNTER — Observation Stay (HOSPITAL_COMMUNITY): Payer: Medicare Other

## 2015-10-25 DIAGNOSIS — R911 Solitary pulmonary nodule: Secondary | ICD-10-CM

## 2015-10-25 DIAGNOSIS — R0989 Other specified symptoms and signs involving the circulatory and respiratory systems: Secondary | ICD-10-CM | POA: Diagnosis not present

## 2015-10-25 DIAGNOSIS — R55 Syncope and collapse: Secondary | ICD-10-CM | POA: Diagnosis not present

## 2015-10-25 DIAGNOSIS — D649 Anemia, unspecified: Secondary | ICD-10-CM | POA: Diagnosis not present

## 2015-10-25 DIAGNOSIS — E079 Disorder of thyroid, unspecified: Secondary | ICD-10-CM

## 2015-10-25 DIAGNOSIS — I9589 Other hypotension: Secondary | ICD-10-CM | POA: Diagnosis not present

## 2015-10-25 DIAGNOSIS — R079 Chest pain, unspecified: Secondary | ICD-10-CM | POA: Diagnosis not present

## 2015-10-25 DIAGNOSIS — I2510 Atherosclerotic heart disease of native coronary artery without angina pectoris: Secondary | ICD-10-CM | POA: Diagnosis not present

## 2015-10-25 DIAGNOSIS — F329 Major depressive disorder, single episode, unspecified: Secondary | ICD-10-CM | POA: Diagnosis not present

## 2015-10-25 DIAGNOSIS — R791 Abnormal coagulation profile: Secondary | ICD-10-CM | POA: Diagnosis not present

## 2015-10-25 LAB — CBC
HEMATOCRIT: 35.2 % — AB (ref 36.0–46.0)
HEMOGLOBIN: 10.8 g/dL — AB (ref 12.0–15.0)
MCH: 27.3 pg (ref 26.0–34.0)
MCHC: 30.7 g/dL (ref 30.0–36.0)
MCV: 89.1 fL (ref 78.0–100.0)
Platelets: 233 10*3/uL (ref 150–400)
RBC: 3.95 MIL/uL (ref 3.87–5.11)
RDW: 13.7 % (ref 11.5–15.5)
WBC: 10.1 10*3/uL (ref 4.0–10.5)

## 2015-10-25 LAB — NM MYOCAR MULTI W/SPECT W/WALL MOTION / EF
CHL CUP MPHR: 153 {beats}/min
CSEPED: 0 min
CSEPHR: 67 %
Estimated workload: 1 METS
Exercise duration (sec): 0 s
Peak HR: 103 {beats}/min
Rest HR: 67 {beats}/min

## 2015-10-25 LAB — BASIC METABOLIC PANEL
ANION GAP: 6 (ref 5–15)
BUN: 8 mg/dL (ref 6–20)
CHLORIDE: 104 mmol/L (ref 101–111)
CO2: 26 mmol/L (ref 22–32)
CREATININE: 0.62 mg/dL (ref 0.44–1.00)
Calcium: 8.3 mg/dL — ABNORMAL LOW (ref 8.9–10.3)
Glucose, Bld: 97 mg/dL (ref 65–99)
Potassium: 4.1 mmol/L (ref 3.5–5.1)
SODIUM: 136 mmol/L (ref 135–145)

## 2015-10-25 LAB — GLUCOSE, CAPILLARY: GLUCOSE-CAPILLARY: 102 mg/dL — AB (ref 65–99)

## 2015-10-25 MED ORDER — TECHNETIUM TC 99M TETROFOSMIN IV KIT
10.0000 | PACK | Freq: Once | INTRAVENOUS | Status: AC | PRN
  Administered 2015-10-25: 10 via INTRAVENOUS

## 2015-10-25 MED ORDER — REGADENOSON 0.4 MG/5ML IV SOLN
INTRAVENOUS | Status: AC
  Administered 2015-10-25: 0.4 mg via INTRAVENOUS
  Filled 2015-10-25: qty 5

## 2015-10-25 MED ORDER — TECHNETIUM TC 99M TETROFOSMIN IV KIT
30.0000 | PACK | Freq: Once | INTRAVENOUS | Status: AC | PRN
  Administered 2015-10-25: 30 via INTRAVENOUS

## 2015-10-25 MED ORDER — REGADENOSON 0.4 MG/5ML IV SOLN
0.4000 mg | Freq: Once | INTRAVENOUS | Status: AC
  Administered 2015-10-25: 0.4 mg via INTRAVENOUS

## 2015-10-25 NOTE — Progress Notes (Signed)
   [name] presented for a lexiscan cardiolite today.  No immediate complications.  Stress imaging pending.  Nicolasa Duckinghristopher Berge, NP 10/25/2015, 10:50 AM

## 2015-10-25 NOTE — Progress Notes (Signed)
Patient ambulated 350 feet on room air. O2 sats ranged from 92-96% Patient did get winded.

## 2015-10-25 NOTE — Progress Notes (Signed)
Patient Name: Jasmine HouseholderBrenda A Mcloughlin Date of Encounter: 10/25/2015  Principal Problem:   Syncope Active Problems:   CORONARY ATHEROSCLEROSIS NATIVE CORONARY ARTERY   Hyperlipidemia   Hypertension   Depression   GERD (gastroesophageal reflux disease)   Tobacco use disorder   Use of cannabis   COPD (chronic obstructive pulmonary disease) (HCC)   Elevated d-dimer   Hypotension   Leukocytosis   Primary Cardiologist: Dr. Tenny Crawoss  Patient Profile  [name] is a 65 y.o. female with a history of HTN, CAD s/p remote PCI to pLAD, COPD, tobacco abuse, HTN, and HLD who [facility] on 10/22/15 after an episode of syncope. Her troponin was noted to be elevated and cardiology was consulted.    SUBJECTIVE:  She has had intermittent chest discomfort this AM.  Echo yesterday showed nl LV fxn.  For myoview today.  OBJECTIVE Filed Vitals:   10/24/15 2056 10/25/15 0519 10/25/15 1023 10/25/15 1029  BP: 144/70 103/73 98/72 122/44  Pulse: 66 66    Temp: 98.6 F (37 C) 98.2 F (36.8 C)    TempSrc: Oral Oral    Resp: 20 20    Height:      Weight:  172 lb 6.4 oz (78.2 kg)    SpO2: 96% 96%      Intake/Output Summary (Last 24 hours) at 10/25/15 1032 Last data filed at 10/25/15 0900  Gross per 24 hour  Intake    480 ml  Output    300 ml  Net    180 ml   Filed Weights   10/23/15 1634 10/24/15 0550 10/25/15 0519  Weight: 171 lb (77.565 kg) 172 lb 11.2 oz (78.336 kg) 172 lb 6.4 oz (78.2 kg)    PHYSICAL EXAM General: Well developed, well nourished, female in no acute distress. Head: Normocephalic, atraumatic.  Neck: Supple without bruits, JVD. Lungs:  Resp regular and unlabored, CTA. Heart: RRR, S1, S2, no S3, S4, or murmur; no rub. Abdomen: Soft, non-tender, non-distended, BS + x 4.  Extremities: No clubbing, cyanosis, edema.  Neuro: Alert and oriented X 3. Moves all extremities spontaneously. Psych: Normal affect.  LABS: CBC:  Recent Labs  10/22/15 1825  10/23/15 0754 10/25/15 0234  WBC 22.4* 9.0 10.1  NEUTROABS 19.3* 5.5  --   HGB 11.9* 9.8* 10.8*  HCT 37.9 31.3* 35.2*  MCV 87.5 88.7 89.1  PLT 297 216 233   Basic Metabolic Panel:  Recent Labs  91/47/8204/10/03 0754 10/25/15 0234  NA 140 136  K 4.3 4.1  CL 115* 104  CO2 22 26  GLUCOSE 80 97  BUN 10 8  CREATININE 0.64 0.62  CALCIUM 7.6* 8.3*   Liver Function Tests:  Recent Labs  10/22/15 1825 10/23/15 0754  AST 21 19  ALT 16 14  ALKPHOS 56 42  BILITOT 0.3 0.5  PROT 6.6 5.3*  ALBUMIN 3.5 2.7*   Cardiac Enzymes:  Recent Labs  10/23/15 0235 10/23/15 0754 10/23/15 1043  TROPONINI 0.17* 0.16* 0.12*    Recent Labs  10/22/15 1913  TROPIPOC 0.00   BNP:  B NATRIURETIC PEPTIDE  Date/Time Value Ref Range Status  10/22/2015 06:25 PM 191.6* 0.0 - 100.0 pg/mL Final    D-dimer:  Recent Labs  10/22/15 1825 10/23/15 0754  DDIMER 10.05* 2.32*     Current facility-administered medications:  .  0.9 % NaCl with KCl 20 mEq/ L  infusion, , Intravenous, Continuous, Bobette Moavid Manuel Ortiz, MD, Last Rate: 125 mL/hr at  10/24/15 0905 .  aspirin chewable tablet 81 mg, 81 mg, Oral, Daily, Bobette Mo, MD, 81 mg at 10/24/15 1051 .  citalopram (CELEXA) tablet 20 mg, 20 mg, Oral, Daily, Bobette Mo, MD, 20 mg at 10/24/15 1051 .  diazepam (VALIUM) tablet 5 mg, 5 mg, Oral, TID PRN, Bobette Mo, MD .  HYDROcodone-acetaminophen [facility]) 10-325 MG per tablet 1 tablet, 1 tablet, Oral, Q6H PRN, Bobette Mo, MD, 1 tablet at 10/24/15 2116 .  iron polysaccharides (NIFEREX) capsule 150 mg, 150 mg, Oral, Daily, Bobette Mo, MD, 150 mg at 10/24/15 1050 .  lidocaine (LIDODERM) 5 % 1 patch, 1 patch, Transdermal, Q24H, Chilton Si, MD, 1 patch at 10/24/15 1701 .  meloxicam (MOBIC) tablet 15 mg, 15 mg, Oral, Daily PRN, Bobette Mo, MD .  nicotine (NICODERM CQ - dosed in mg/24 hours) patch 21 mg, 21 mg, Transdermal, Daily, Bobette Mo, MD, 21 mg at  10/24/15 1051 .  nitroGLYCERIN (NITROSTAT) SL tablet 0.4 mg, 0.4 mg, Sublingual, Q5 min PRN, Bobette Mo, MD .  ondansetron [facility]) tablet 4 mg, 4 mg, Oral, Q6H PRN, 4 mg at 10/23/15 1904 **OR** ondansetron [facility]) injection 4 mg, 4 mg, Intravenous, Q6H PRN, Bobette Mo, MD .  pantoprazole (PROTONIX) EC tablet 40 mg, 40 mg, Oral, Daily, Bobette Mo, MD, 40 mg at 10/24/15 1051 .  polyethylene glycol (MIRALAX / GLYCOLAX) packet 17 g, 17 g, Oral, Daily, Dorothea Ogle, MD, 17 g at 10/24/15 1054 .  pravastatin (PRAVACHOL) tablet 80 mg, 80 mg, Oral, Daily, Bobette Mo, MD, 80 mg at 10/24/15 1051 .  senna-docusate (Senokot-S) tablet 1 tablet, 1 tablet, Oral, BID, Dorothea Ogle, MD, 1 tablet at 10/24/15 2116 .  sodium chloride flush (NS) 0.9 % injection 3 mL, 3 mL, Intravenous, Q12H, Bobette Mo, MD, 3 mL at 10/24/15 1050 .  sodium chloride flush (NS) 0.9 % injection 3 mL, 3 mL, Intravenous, Q12H, Bobette Mo, MD, 0 mL at 10/23/15 0215 . 0.9 % NaCl with KCl 20 mEq / L 125 mL/hr at 10/24/15 0905    TELE:  NSR    ECG:NSR    Current Medications:  . aspirin  81 mg Oral Daily  . citalopram  20 mg Oral Daily  . iron polysaccharides  150 mg Oral Daily  . lidocaine  1 patch Transdermal Q24H  . nicotine  21 mg Transdermal Daily  . pantoprazole  40 mg Oral Daily  . polyethylene glycol  17 g Oral Daily  . pravastatin  80 mg Oral Daily  . senna-docusate  1 tablet Oral BID  . sodium chloride flush  3 mL Intravenous Q12H  . sodium chloride flush  3 mL Intravenous Q12H   . 0.9 % NaCl with KCl 20 mEq / L 125 mL/hr at 10/24/15 0905   2D Echocardiogram 6.6.2017  Study Conclusions  - Left ventricle: The cavity size was normal. Wall thickness was  normal. Systolic function was normal. The estimated ejection  fraction was in the range of 55% to 60%. Wall motion was normal;  there were no regional wall motion abnormalities. Doppler  parameters are consistent  with abnormal left ventricular  relaxation (grade 1 diastolic dysfunction).  ASSESSMENT AND PLAN: Principal Problem:   Syncope Active Problems:   CORONARY ATHEROSCLEROSIS NATIVE CORONARY ARTERY   Hyperlipidemia   Hypertension   Depression   GERD (gastroesophageal reflux disease)   Tobacco use disorder   Use of cannabis  COPD (chronic obstructive pulmonary disease) (HCC)   Elevated d-dimer   Hypotension   Leukocytosis   1. Syncope: Echo and carotid Dopplers have been completed - nl EF/40-59% bilat ICA stenosis. No events on telemetry.Myoview today in setting of mild trop elevation and PVC's noted yesterday.  Consider outpt event monitoring if w/u unrevealing.  2. Unstable Angina/CAD s/p remote stenting to LAD/elevated troponin: Likely demand ischemia. However, she had a run of ventricular bigeminy and trigeminy on admission. Continues to have intermittent chest discomfort - worse with palpation over ribs.  Lexiscan Myoview today.  Continue aspirin and statin. No BB due to bradycardia.   3. 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 She is on ASA and statin.   4. Heavy tobacco abuse: smokes 2 PPD. Not interested in quitting.  5. Elevated Ddimer: CTA neg for PE.   6. Right middle lobe and right lower lobe pulmonary nodules: Minimal interval increase since prior study in 2012.    Signed, Nicolasa Ducking , NP 10:32 AM 10/25/2015 Pager [phone]

## 2015-10-25 NOTE — Progress Notes (Addendum)
Patient ID: [name], female   DOB: 04-11-1951, 65 y.o.   MRN: [id number]  PROGRESS NOTE    IYLA BALZARINI  ZHY:[id number] DOB: [birth year] DOA: 10/22/2015  PCP: Pamelia Hoit, MD   Brief Narrative:  65 y.o. female with medical history significant of hypertension, CAD, stented coronary artery, hyperlipidemia, hypertension, colon polyps, GERD, depression, anxiety, tobacco use disorder who presented to [facility] status post syncopal event at home. Patient does not recall details of events prior to syncope. EMS was called by family members. Patient regained consciousness after a few minutes.  Patient was hemodynamically stable on the admission. No acute fractures noted on CT head and cervical spine. She was admitted for syncope evaluation. Her 12-lead EKG showed sinus rhythm with occasional PVC. The troponin level was 0.17 on the admission. Cardiology has seen the patient in consultation and plan is for stress test today.  Assessment & Plan:  Syncope - Likely vasovagal vs secondary to orthostatic hypotension - Blood pressure drops with changing position from standing to sitting - No evidence of pulmonary embolism on CT chest angiogram - Troponin levels 0.17, 0.16, 0.12 - 2-D echo with normal ejection fraction, grade 1 diastolic dysfunction - Her urine drug screen was positive for THCWhich could possibly contribute to her symptoms - Plan for stress test today  Right middle lobe and right lower lobe pulmonary nodules - Minimal interval increase since prior study in 2012 - Outpatient follow-up  Right thyroid lobe mass - Unchanged from prior study in 2013  Normocytic anemia - Hemoglobin stable at 10.8 - No reports of bleeding  Leukocytosis - No evidence of acute infectious process - White blood cell count subsequently normalized   DVT prophylaxis: SCDs bilaterally Code Status: full code  Family Communication: Daughter at the bedside Disposition Plan: Home  likely today or tomorrow depending on results of the stress test and if blood pressure is stable.   Consultants:   Cardiology  Procedures:   2-D echo - ejection fraction 55% with grade 1 diastolic dysfunction, normal wall motion  Carotid Doppler - Bilateral : 40-59% internal carotid artery stenosis. Doppler waveforms are abnormal throughout with moderate heterogeneous and calcific plaque throughout. Vertebral artery flow is antegrade.  Stress test planned for 10/25/2015  Antimicrobials:   None    Subjective: Says she is hurting all over.   Objective: Filed Vitals:   10/25/15 1023 10/25/15 1030 10/25/15 1032 10/25/15 1034  BP: 98/72 122/44 103/57 97/64  Pulse:      Temp:      TempSrc:      Resp:      Height:      Weight:      SpO2:        Intake/Output Summary (Last 24 hours) at 10/25/15 1056 Last data filed at 10/25/15 0900  Gross per 24 hour  Intake    480 ml  Output    300 ml  Net    180 ml   Filed Weights   10/23/15 1634 10/24/15 0550 10/25/15 0519  Weight: 77.565 kg (171 lb) 78.336 kg (172 lb 11.2 oz) 78.2 kg (172 lb 6.4 oz)    Examination:  General exam: Appears calm and comfortable  Respiratory system: Clear to auscultation. Respiratory effort normal. Cardiovascular system: S1 & S2 heard, Rate controlled, (+) Carotid bruit  Gastrointestinal system: Abdomen is nondistended, soft and nontender. No organomegaly or masses felt. Normal bowel sounds heard. Central nervous system: Alert and oriented. No focal neurological deficits. Extremities: Symmetric 5 x  5 power. Skin: No rashes, lesions or ulcers Psychiatry: Judgement and insight appear normal. Mood & affect appropriate.   Data Reviewed: I have personally reviewed following labs and imaging studies  CBC:  Recent Labs Lab 10/22/15 1825 10/23/15 0754 10/25/15 0234  WBC 22.4* 9.0 10.1  NEUTROABS 19.3* 5.5  --   HGB 11.9* 9.8* 10.8*  HCT 37.9 31.3* 35.2*  MCV 87.5 88.7 89.1  PLT 297 216  233   Basic Metabolic Panel:  Recent Labs Lab 10/22/15 1825 10/23/15 0754 10/25/15 0234  NA 139 140 136  K 3.8 4.3 4.1  CL 108 115* 104  CO2 27 22 26   GLUCOSE 106* 80 97  BUN 10 10 8   CREATININE 0.70 0.64 0.62  CALCIUM 8.4* 7.6* 8.3*   GFR: Estimated Creatinine Clearance: 70.6 mL/min (by C-G formula based on Cr of 0.62). Liver Function Tests:  Recent Labs Lab 10/22/15 1825 10/23/15 0754  AST 21 19  ALT 16 14  ALKPHOS 56 42  BILITOT 0.3 0.5  PROT 6.6 5.3*  ALBUMIN 3.5 2.7*   No results for input(s): LIPASE, AMYLASE in the last 168 hours. No results for input(s): AMMONIA in the last 168 hours. Coagulation Profile: No results for input(s): INR, PROTIME in the last 168 hours. Cardiac Enzymes:  Recent Labs Lab 10/23/15 0235 10/23/15 0754 10/23/15 1043  TROPONINI 0.17* 0.16* 0.12*   BNP (last 3 results) No results for input(s): PROBNP in the last 8760 hours. HbA1C: No results for input(s): HGBA1C in the last 72 hours. CBG:  Recent Labs Lab 10/23/15 0637 10/24/15 0600 10/25/15 0622  GLUCAP 88 87 102*   Lipid Profile: No results for input(s): CHOL, HDL, LDLCALC, TRIG, CHOLHDL, LDLDIRECT in the last 72 hours. Thyroid Function Tests: No results for input(s): TSH, T4TOTAL, FREET4, T3FREE, THYROIDAB in the last 72 hours. Anemia Panel: No results for input(s): VITAMINB12, FOLATE, FERRITIN, TIBC, IRON, RETICCTPCT in the last 72 hours. Urine analysis:    Component Value Date/Time   COLORURINE YELLOW 10/22/2015 1955   APPEARANCEUR CLEAR 10/22/2015 1955   LABSPEC 1.012 10/22/2015 1955   PHURINE 6.0 10/22/2015 1955   GLUCOSEU NEGATIVE 10/22/2015 1955   HGBUR TRACE* 10/22/2015 1955   BILIRUBINUR NEGATIVE 10/22/2015 1955   KETONESUR NEGATIVE 10/22/2015 1955   PROTEINUR NEGATIVE 10/22/2015 1955   UROBILINOGEN 0.2 03/04/2014 1039   NITRITE NEGATIVE 10/22/2015 1955   LEUKOCYTESUR NEGATIVE 10/22/2015 1955   Sepsis  Labs: @LABRCNTIP (procalcitonin:4,lacticidven:4)  Recent Results (from the past 240 hour(s))  Blood Culture (routine x 2)     Status: None (Preliminary result)   Collection Time: 10/22/15  6:35 PM  Result Value Ref Range Status   Specimen Description BLOOD RIGHT HAND  Final   Special Requests IN PEDIATRIC BOTTLE 2CC  Final   Culture NO GROWTH 2 DAYS  Final   Report Status PENDING  Incomplete  Blood Culture (routine x 2)     Status: None (Preliminary result)   Collection Time: 10/22/15  6:42 PM  Result Value Ref Range Status   Specimen Description BLOOD LEFT HAND  Final   Special Requests BOTTLES DRAWN AEROBIC ONLY 7CC  Final   Culture NO GROWTH 2 DAYS  Final   Report Status PENDING  Incomplete  Urine culture     Status: Abnormal   Collection Time: 10/22/15  7:55 PM  Result Value Ref Range Status   Specimen Description URINE, RANDOM  Final   Special Requests NONE  Final   Culture <10,000 COLONIES/mL INSIGNIFICANT GROWTH (A)  Final  Report Status 10/23/2015 FINAL  Final      Radiology Studies: Ct Head Wo Contrast 10/22/2015  CT: No intracranial mass, hemorrhage, or extra-axial fluid collection. Gray-white compartments appear normal. There is a the fairly prominent posterior parietal scalp hematoma in the midline. No fracture evident. There is extensive maxillary sinus disease bilaterally with mild ethmoid sinus disease bilaterally. CT cervical spine: No fracture or spondylolisthesis. Mild osteoarthritic changes several levels without nerve root edema or effacement. No disc extrusion or stenosis. Extensive arterial vascular calcification noted in both carotid arteries and in the right vertebral artery. There is a dominant mass arising from the posterior aspect the right lobe of the thyroid. This mass was present on prior study from 2013 and has remained stable.   Ct Angio Chest Pe W/cm &/or Wo Cm 10/22/2015   No CT evidence of pulmonary embolism. Right middle lobe and right lower lobe  pulmonary nodules. There has been minimal interval increase in the size of the right middle lobe nodule since the study dated 2012. Mild dilatation of the left atrium with findings suggestive of a degree of right cardiac dysfunction. Correlation with echocardiogram recommended.   Ct Cervical Spine Wo Contrast 10/22/2015  CT: No intracranial mass, hemorrhage, or extra-axial fluid collection. Gray-white compartments appear normal. There is a the fairly prominent posterior parietal scalp hematoma in the midline. No fracture evident. There is extensive maxillary sinus disease bilaterally with mild ethmoid sinus disease bilaterally. CT cervical spine: No fracture or spondylolisthesis. Mild osteoarthritic changes several levels without nerve root edema or effacement. No disc extrusion or stenosis. Extensive arterial vascular calcification noted in both carotid arteries and in the right vertebral artery. There is a dominant mass arising from the posterior aspect the right lobe of the thyroid. This mass was present on prior study from 2013 and has remained stable.   Dg Chest Port 1 View 10/22/2015   Cardiomegaly without evidence of acute cardiopulmonary disease. Electronically Signed   By: Harmon PierJeffrey  Hu M.D.   On: 10/22/2015 18:42    Scheduled Meds: . aspirin  81 mg Oral Daily  . citalopram  20 mg Oral Daily  . iron polysaccharides  150 mg Oral Daily  . lidocaine  1 patch Transdermal Q24H  . nicotine  21 mg Transdermal Daily  . pantoprazole  40 mg Oral Daily  . polyethylene glycol  17 g Oral Daily  . pravastatin  80 mg Oral Daily  . senna-docusate  1 tablet Oral BID  . sodium chloride flush  3 mL Intravenous Q12H  . sodium chloride flush  3 mL Intravenous Q12H   Continuous Infusions: . 0.9 % NaCl with KCl 20 mEq / L 125 mL/hr at 10/24/15 0905       Time spent: 25 minutes  Greater than 50% of the time spent on counseling and coordinating the care.   Manson PasseyEVINE, Lorelee Mclaurin, MD [facility] Pager  [phone]  If 7PM-7AM, please contact night-coverage [email or web address] Password [email or web address] 10/25/2015, 10:56 AM

## 2015-10-25 NOTE — Progress Notes (Signed)
Physical Therapy Treatment Patient Details Name: Jasmine HouseholderBrenda A Ayala MRN: [id number] DOB: [birth year] Today's Date: 10/25/2015    History of Present Illness Pt adm with syncope. PMH - HTN, CAD, depression/anxiety,     PT Comments    Patient is progressing toward mobility goals. SpO2 87% with ambulation and elevated to 93% with ~ 1 min of pursed lip breathing. Pt c/o pain L ribs with transitional movements. Daughter present for session. Continue to progress as tolerated.   Follow Up Recommendations  No PT follow up     Equipment Recommendations  Rolling walker with 5" wheels    Recommendations for Other Services       Precautions / Restrictions Precautions Precautions: Fall Restrictions Weight Bearing Restrictions: No    Mobility  Bed Mobility Overal bed mobility: Needs Assistance Bed Mobility: Rolling;Sidelying to Sit;Sit to Sidelying Rolling: Supervision Sidelying to sit: Supervision     Sit to sidelying: Supervision General bed mobility comments: cues for sequencing and technique for pain management  Transfers Overall transfer level: Modified independent Equipment used: None                Ambulation/Gait Ambulation/Gait assistance: Supervision Ambulation Distance (Feet): 120 Feet Assistive device: None Gait Pattern/deviations: Step-through pattern;Decreased stride length Gait velocity: decr   General Gait Details: no nausea or dizziness reported this session; one standing rest break due to SOB with SpO2 87%; educated pt in pursed lip breathing and SpO2 elevated to 94% on RA; cues for posture and self monitoring need for rest breaks   Stairs            Wheelchair Mobility    Modified Rankin (Stroke Patients Only)       Balance     Sitting balance-Leahy Scale: Good       Standing balance-Leahy Scale: Fair                      Cognition Arousal/Alertness: Awake/alert Behavior During Therapy: WFL for tasks  assessed/performed Overall Cognitive Status: Within Functional Limits for tasks assessed                      Exercises      General Comments General comments (skin integrity, edema, etc.): daughter present for session; bruising on back of head       Pertinent Vitals/Pain Pain Assessment: Faces Faces Pain Scale: Hurts even more Pain Location: L ribs with transitional movements Pain Descriptors / Indicators: Grimacing;Guarding;Sore Pain Intervention(s): Limited activity within patient's tolerance;Monitored during session;Repositioned    Home Living                      Prior Function            PT Goals (current goals can now be found in the care plan section) Acute Rehab PT Goals Patient Stated Goal: go home PT Goal Formulation: With patient Time For Goal Achievement: 10/31/15 Potential to Achieve Goals: Good Progress towards PT goals: Progressing toward goals    Frequency  Min 3X/week    PT Plan Current plan remains appropriate    Co-evaluation             End of Session Equipment Utilized During Treatment: Gait belt Activity Tolerance: Patient tolerated treatment well Patient left: in bed;with call bell/phone within reach;with family/visitor present     Time: 1417-1440 PT Time Calculation (min) (ACUTE ONLY): 23 min  Charges:  $Gait Training: 8-22 mins $Therapeutic Activity: 8-22 mins  Derek Mound, PTA Pager: [phone]   10/25/2015, 3:33 PM

## 2015-10-26 ENCOUNTER — Other Ambulatory Visit: Payer: Self-pay | Admitting: Cardiology

## 2015-10-26 DIAGNOSIS — R0789 Other chest pain: Secondary | ICD-10-CM | POA: Diagnosis not present

## 2015-10-26 DIAGNOSIS — F329 Major depressive disorder, single episode, unspecified: Secondary | ICD-10-CM | POA: Diagnosis not present

## 2015-10-26 DIAGNOSIS — I9589 Other hypotension: Secondary | ICD-10-CM | POA: Diagnosis not present

## 2015-10-26 DIAGNOSIS — R0989 Other specified symptoms and signs involving the circulatory and respiratory systems: Secondary | ICD-10-CM | POA: Diagnosis not present

## 2015-10-26 DIAGNOSIS — I2510 Atherosclerotic heart disease of native coronary artery without angina pectoris: Secondary | ICD-10-CM | POA: Diagnosis not present

## 2015-10-26 DIAGNOSIS — R079 Chest pain, unspecified: Secondary | ICD-10-CM | POA: Insufficient documentation

## 2015-10-26 DIAGNOSIS — R55 Syncope and collapse: Secondary | ICD-10-CM | POA: Diagnosis not present

## 2015-10-26 DIAGNOSIS — D649 Anemia, unspecified: Secondary | ICD-10-CM | POA: Diagnosis not present

## 2015-10-26 LAB — LIPID PANEL
Cholesterol: 103 mg/dL (ref 0–200)
HDL: 29 mg/dL — ABNORMAL LOW (ref >40)
LDL Cholesterol: 59 mg/dL (ref 0–99)
Total CHOL/HDL Ratio: 3.6 RATIO
Triglycerides: 73 mg/dL (ref <150)
VLDL: 15 mg/dL (ref 0–40)

## 2015-10-26 LAB — GLUCOSE, CAPILLARY: GLUCOSE-CAPILLARY: 95 mg/dL (ref 65–99)

## 2015-10-26 MED ORDER — POLYETHYLENE GLYCOL 3350 17 G PO PACK: 17.0000 g | PACK | Freq: Every day | ORAL | Status: DC

## 2015-10-26 NOTE — Progress Notes (Signed)
Carron BrazenBrenda A Custis to be D/C'd Home per MD order. Discussed with the patient and all questions fully answered.    VVS, Skin clean, dry and intact without evidence of skin break down, no evidence of skin tears noted.  IV catheter discontinued intact. Site without signs and symptoms of complications. Dressing and pressure applied.  An After Visit Summary was printed and given to the patient.  Patient escorted via WC, and D/C home via private auto.  Kai LevinsJacobs, Paizlee Kinder N  10/26/2015 1:52 PM

## 2015-10-26 NOTE — Progress Notes (Addendum)
   PATIENT ID: [name] is a [age]F with CAD s/p PCI and 95% DI not amenable to intervention, hypertension, hyperlipidemia and ongoing tobacco abuse (2.5 ppd), here with syncope and chest pain.  SUBJECTIVE:  Continues to have pain in the R lower ribs.    PHYSICAL EXAM Filed Vitals:   10/25/15 1415 10/25/15 1417 10/25/15 2026 10/26/15 0437  BP: 86/62 166/68 112/80 153/98  Pulse: 75  66 60  Temp: 99.1 F (37.3 C)  98.5 F (36.9 C) 98 F (36.7 C)  TempSrc: Oral  Oral Oral  Resp: 19  18 20   Height:      Weight:      SpO2: 97%  98% 95%   Gen: Chronically ill-appearing. No acute distress Neck: No JVD COR: RRR. No m/r/g. Normal S1/S2  Lungs: Mild expiratory wheezeing  Abd: Soft, NT, ND. +BS Ext: WWP. No edema.   LABS: Lab Results  Component Value Date   TROPONINI 0.12* 10/23/2015   Results for orders placed or performed during the hospital encounter of 10/22/15 (from the past 24 hour(s))  Lipid panel     Status: Abnormal   Collection Time: 10/26/15  3:00 AM  Result Value Ref Range   Cholesterol 103 0 - 200 mg/dL   Triglycerides 73 <161<150 mg/dL   HDL 29 (L) >09>40 mg/dL   Total CHOL/HDL Ratio 3.6 RATIO   VLDL 15 0 - 40 mg/dL   LDL Cholesterol 59 0 - 99 mg/dL  Glucose, capillary     Status: None   Collection Time: 10/26/15  6:48 AM  Result Value Ref Range   Glucose-Capillary 95 65 - 99 mg/dL    Intake/Output Summary (Last 24 hours) at 10/26/15 1329 Last data filed at 10/26/15 0900  Gross per 24 hour  Intake   1180 ml  Output    600 ml  Net    580 ml    Telemetry: sinus rhythm.  Occasional PVCs.   ASSESSMENT AND PLAN:  Principal Problem:   Syncope Active Problems:   CORONARY ATHEROSCLEROSIS NATIVE CORONARY ARTERY   Hyperlipidemia   Hypertension   Depression   GERD (gastroesophageal reflux disease)   Tobacco use disorder   Use of cannabis   COPD (chronic obstructive pulmonary disease) (HCC)   Elevated d-dimer   Hypotension   Leukocytosis   Bilateral  carotid bruits    # Syncope: Echo revealed normal systolic function and grade 1 diastolic dysfunction. Carotid Dopplers revealed moderate obstruction.  Lexiscan Myoview was negative for ischemia.  We will obtain a 30 day event monitor as an outpatient.  She reports that she had a long coughing episode immediately before passing out.  This was likely the cause of her syncope.  Outpatient follow up has been arranged.   Elevated troponin: CAD s/p remote stenting to LAD:  Likely demand ischemia.  Lexiscan Myoview was negative for ischemia. Continue aspirin and statin.   No BB due to bradycardia.   Moderate carotid stenosis: Continue aspirin and statin.  Repeat in 1 year.  Heavy tobacco abuse: Smokes 2 PPD. Not interested in quitting  Elevated D-dimer: CTA neg for PE.   Right middle lobe and right lower lobe pulmonary nodules: Minimal interval increase since prior study in 2012   Darragh Nay C. Duke Salviaandolph, MD, [facility] 10/26/2015 1:29 PM

## 2015-10-26 NOTE — Discharge Instructions (Signed)
Hypotension  As your heart beats, it forces blood through your arteries. This force is your blood pressure. If your blood pressure is too low for you to go about your normal activities or to support the organs of your body, you have hypotension. Hypotension is also referred to as low blood pressure. When your blood pressure becomes too low, you may not get enough blood to your brain. As a result, you may feel weak, feel lightheaded, or develop a rapid heart rate. In a more severe case, you may faint.  CAUSES  Various conditions can cause hypotension. These include:  · Blood loss.  · Dehydration.  · Heart or endocrine problems.  · Pregnancy.  · Severe infection.  · Not having a well-balanced diet filled with needed nutrients.  · Severe allergic reactions (anaphylaxis).  Some medicines, such as blood pressure medicine or water pills (diuretics), may lower your blood pressure below normal. Sometimes taking too much medicine or taking medicine not as directed can cause hypotension.  TREATMENT   Hospitalization is sometimes required for hypotension if fluid or blood replacement is needed, if time is needed for medicines to wear off, or if further monitoring is needed. Treatment might include changing your diet, changing your medicines (including medicines aimed at raising your blood pressure), and use of support stockings.  HOME CARE INSTRUCTIONS   · Drink enough fluids to keep your urine clear or pale yellow.  · Take your medicines as directed by your health care provider.  · Get up slowly from reclining or sitting positions. This gives your blood pressure a chance to adjust.  · Wear support stockings as directed by your health care provider.  · Maintain a healthy diet by including nutritious food, such as fruits, vegetables, nuts, whole grains, and lean meats.  SEEK MEDICAL CARE IF:  · You have vomiting or diarrhea.  · You have a fever for more than 2-3 days.  · You feel more thirsty than usual.  · You feel weak and  tired.  SEEK IMMEDIATE MEDICAL CARE IF:   · You have chest pain or a fast or irregular heartbeat.  · You have a loss of feeling in some part of your body, or you lose movement in your arms or legs.  · You have trouble speaking.  · You become sweaty or feel lightheaded.  · You faint.  MAKE SURE YOU:   · Understand these instructions.  · Will watch your condition.  · Will get help right away if you are not doing well or get worse.     This information is not intended to replace advice given to you by your health care provider. Make sure you discuss any questions you have with your health care provider.     Document Released: 05/06/2005 Document Revised: 02/24/2013 Document Reviewed: 11/06/2012  Elsevier Interactive Patient Education ©2016 [employer]

## 2015-10-26 NOTE — Care Management Note (Addendum)
Case Management Note Donn PieriniKristi Alea Ryer RN, BSN Unit 2W-Case Manager [phone]  Patient Details  Name: Jasmine HouseholderBrenda A Ayala MRN: [id number] Date of Birth: 29-Sep-1950  Subjective/Objective:    Pt admitted with syncope                Action/Plan: PTA pt lived at home - anticipate return home has supportive family nearby  - order placed for RW- [facility] to room prior to discharge  Expected Discharge Date:    10/26/15              Expected Discharge Plan:  Home/Self Care  In-House Referral:     Discharge planning Services  CM Consult  Post Acute Care Choice:  Durable Medical Equipment Choice offered to:     DME Arranged:  Walker rolling DME Agency:  [facility]  [facility]     Status of Service:  Completed, signed off  Medicare Important Message Given:    Date Medicare IM Given:    Medicare IM give by:    Date Additional Medicare IM Given:    Additional Medicare Important Message give by:     If discussed at Long Length of Stay Meetings, dates discussed:    Additional Comments:  10/26/15- received msg from PT that pt also interested in shower chair- insurance does not cover the cost for shower chairs- [facility] to see if she wants one from them or just go buy one from local store.   Darrold SpanWebster, Dieter Hane Hall, RN 10/26/2015, 11:03 AM

## 2015-10-26 NOTE — Discharge Summary (Addendum)
Physician Discharge Summary  [name] MVH:[id number] DOB: 14-Nov-1950 DOA: 10/22/2015  PCP: Pamelia Hoit, MD  Admit date: 10/22/2015 Discharge date: 10/26/2015  Recommendations for Outpatient Follow-up:  Please follow-up with cardiology office for 30 day event monitor. They will set this up in the office on an outpatient basis.  Discharge Diagnoses:  Principal Problem:   Syncope Active Problems:   CORONARY ATHEROSCLEROSIS NATIVE CORONARY ARTERY   Hyperlipidemia   Hypertension   Depression   GERD (gastroesophageal reflux disease)   Tobacco use disorder   Use of cannabis   COPD (chronic obstructive pulmonary disease) (HCC)   Elevated d-dimer   Hypotension   Leukocytosis   Bilateral carotid bruits    Discharge Condition: stable   Diet recommendation: as tolerated   History of present illness:  65 y.o. female with medical history significant of hypertension, CAD, stented coronary artery, hyperlipidemia, hypertension, colon polyps, GERD, depression, anxiety, tobacco use disorder who presented to [facility] status post syncopal event at home. Patient does not recall details of events prior to syncope. EMS was called by family members. Patient regained consciousness after a few minutes.  Patient was hemodynamically stable on the admission. No acute fractures noted on CT head and cervical spine. She was admitted for syncope evaluation. Her 12-lead EKG showed sinus rhythm with occasional PVC. The troponin level was 0.17 on the admission. Cardiology has seen the patient in consultation and plan is for stress test today.  Hospital Course:   Assessment & Plan:  Syncope - Likely vasovagal vs secondary to orthostatic hypotension versus multiple hypertensive medications - We held lisinopril, losartan and metoprolol on admission. - Finally her blood pressure has improved, 153/98 - Would recommend she takes metoprolol on discharge and holding lisinopril and losartan for  at least another week until we know that the blood pressure stabilizes. I spoke with her daughter about this extensively and she wrote down exactly which medications patient should continue and which one she should hold. 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  Normocytic anemia - Hemoglobin stable at 10.8 - No reports of bleeding  Leukocytosis - No evidence of acute infectious process - White blood cell count subsequently normalized  Depression - Patient is not depressed at this time  - Continue celexa    DVT prophylaxis: SCDs bilaterally Code Status: full code  Family Communication: family not at the bedside this am, I called pt daughter over the phone informed her of cardiology recommendations, discharge plan, medications to continue on discharge and also recommended to minimize pain meds and valium if possible as some of her symptoms of hypotension may come from those meds     Consultants:   Cardiology  Procedures:   2-D echo - ejection fraction 55% with grade 1 diastolic dysfunction, normal wall motion  Carotid Doppler - Bilateral : 40-59% internal carotid artery stenosis. Doppler waveforms are abnormal throughout with moderate  heterogeneous and calcific plaque throughout. Vertebral artery flow is antegrade.  Stress test planned for 10/25/2015  Antimicrobials:    None    Signed:  Manson Passey, MD  [facility] 10/26/2015, 10:26 AM  Pager #: [phone]  Time spent in minutes: more than 30 minutes    Discharge Exam: Filed Vitals:   10/25/15 2026 10/26/15 0437  BP: 112/80 153/98  Pulse: 66 60  Temp: 98.5 F (36.9 C) 98 F (36.7 C)  Resp: 18 20   Filed Vitals:   10/25/15 1415 10/25/15 1417 10/25/15 2026 10/26/15 0437  BP: 86/62 166/68 112/80 153/98  Pulse: 75  66 60  Temp: 99.1 F (37.3 C)  98.5 F (36.9 C) 98 F (36.7 C)  TempSrc: Oral  Oral Oral  Resp: 19  18 20   Height:      Weight:      SpO2: 97%  98% 95%    General: Pt is alert, follows commands appropriately, not in acute distress Cardiovascular: Regular rate and rhythm, S1/S2 + Respiratory: Clear to auscultation bilaterally, no wheezing, no crackles, no rhonchi Abdominal: Soft, non tender, non distended, bowel sounds +, no guarding Extremities: no edema, no cyanosis, pulses palpable bilaterally DP and PT Neuro: Grossly nonfocal  Discharge Instructions  Discharge Instructions    Call MD for:  difficulty breathing, headache or visual disturbances    Complete by:  As directed      Call MD for:  persistant dizziness or light-headedness    Complete by:  As directed      Call MD for:  persistant nausea and vomiting    Complete by:  As directed      Call MD for:  severe uncontrolled pain    Complete by:  As directed      Diet - low sodium heart healthy    Complete by:  As directed      Discharge instructions    Complete by:  As directed   Please follow-up with cardiology office for 30 day event monitor. They will set this up in the office on an outpatient basis.     Increase activity slowly    Complete by:  As directed             Medication List    STOP taking these medications        lisinopril 10 MG tablet  Commonly known as:  PRINIVIL,ZESTRIL     losartan 25 MG tablet  Commonly known as:  COZAAR      TAKE these medications         aspirin 81 MG tablet  Take 81 mg by mouth daily.     citalopram 20 MG tablet  Commonly known as:  CELEXA  Take 20 mg by mouth daily.     diazepam 5 MG tablet  Commonly known as:  VALIUM  Take 1 tablet by mouth 3 (three) times daily as needed for anxiety.     esomeprazole 40 MG capsule  Commonly known as:  NEXIUM  Take 40 mg by mouth daily at 12 noon.     FERREX 150 150 MG capsule  Generic drug:  iron polysaccharides  Take 150 mg by mouth daily.     HYDROcodone-acetaminophen 10-325 MG tablet  Commonly known as:  NORCO  Take 1 tablet by mouth every 6 (six) hours as needed for moderate pain.     meloxicam 15 MG tablet  Commonly known as:  MOBIC  Take 15 mg by mouth daily as needed (  for muscle or joint pain).     metoprolol 50 MG tablet  Commonly known as:  LOPRESSOR  Take 50 mg by mouth daily.     NITROSTAT 0.4 MG SL tablet  Generic drug:  nitroGLYCERIN  Place 0.4 mg under the tongue every 5 (five) minutes as needed for chest pain.     omeprazole 20 MG capsule  Commonly known as:  PRILOSEC  Take 20 mg by mouth daily.     polyethylene glycol packet  Commonly known as:  MIRALAX / GLYCOLAX  Take 17 g by mouth daily.     pravastatin 80 MG tablet  Commonly known as:  PRAVACHOL  Take 80 mg by mouth daily.     VITAMIN D PO  Take 1 capsule by mouth daily.             Follow-up Information    Follow up with Pamelia Hoit, MD. Schedule an appointment as soon as possible for a visit in 1 week.   Specialty:  Family Medicine   Why:  Follow up appt after recent hospitalization   Contact information:   [address] [place] [address] [phone]        The results of significant diagnostics from this hospitalization (including imaging, microbiology, ancillary and laboratory) are listed below for reference.    Significant Diagnostic Studies: Dg Ribs Unilateral Left  10/25/2015  CLINICAL DATA:  Anterior rib pain after fall backwards today on the floor  in her kitchen EXAM: LEFT RIBS - 2 VIEW COMPARISON:  10/22/2015 FINDINGS: Four views of the left ribs submitted. No left rib fracture is identified. There is no pneumothorax. IMPRESSION: Negative. Electronically Signed   By: Natasha Mead M.D.   On: 10/25/2015 15:29   Ct Head Wo Contrast  10/22/2015  CLINICAL DATA:  Pain following fall. Loss of consciousness after fall EXAM: CT HEAD WITHOUT CONTRAST CT CERVICAL SPINE WITHOUT CONTRAST TECHNIQUE: Multidetector CT imaging of the head and cervical spine was performed following the standard protocol without intravenous contrast. Multiplanar CT image reconstructions of the cervical spine were also generated. COMPARISON:  CT head February 01, 2011; CT cervical spine August 12, 2011 FINDINGS: CT HEAD FINDINGS The ventricles are normal in size and configuration for age. There is no intracranial mass, hemorrhage, extra-axial fluid collection, or midline shift. Gray-white compartments appear normal. No acute infarct evident. There is a the midline posterior parietal scalp hematoma. The bony calvarium appears intact. Mastoid air cells are clear. There is extensive opacification of most of the maxillary antra bilaterally. There is mild mucosal thickening in several ethmoid air cells bilaterally. Frontal sinuses are hypoplastic. No orbital lesions are evident. CT CERVICAL SPINE FINDINGS There is no fracture or spondylolisthesis. Prevertebral soft tissues and predental space regions are normal. The disc spaces appear unremarkable. There is no nerve root edema or effacement. No disc extrusion or stenosis. There is mild facet hypertrophy at several levels bilaterally. There is a dominant mass arising from the posterior aspect of the right lobe of the thyroid measuring 1.7 x 1.6 cm, essentially stable from 2013 study. There are foci of carotid artery calcification bilaterally. There is also calcification in the midportion of the right vertebral artery. IMPRESSION: CT: No intracranial  mass, hemorrhage, or extra-axial fluid collection. Gray-white compartments appear normal. There is a the fairly prominent posterior parietal scalp hematoma in the midline. No fracture evident. There is extensive maxillary sinus disease bilaterally with mild ethmoid sinus disease bilaterally. CT cervical spine: No fracture or  spondylolisthesis. Mild osteoarthritic changes several levels without nerve root edema or effacement. No disc extrusion or stenosis. Extensive arterial vascular calcification noted in both carotid arteries and in the right vertebral artery. There is a dominant mass arising from the posterior aspect the right lobe of the thyroid. This mass was present on prior study from 2013 and has remained stable. Electronically Signed   By: Bretta Bang III M.D.   On: 10/22/2015 19:38   Ct Angio Chest Pe W/cm &/or Wo Cm  10/22/2015  CLINICAL DATA:  65 year old female with shorts of breath and elevated D-dimers EXAM: CT ANGIOGRAPHY CHEST WITH CONTRAST TECHNIQUE: Multidetector CT imaging of the chest was performed using the standard protocol during bolus administration of intravenous contrast. Multiplanar CT image reconstructions and MIPs were obtained to evaluate the vascular anatomy. CONTRAST:  100 cc Isovue 370 COMPARISON:  Chest radiograph dated 10/22/2015 and CT dated 02/01/2011 FINDINGS: There is mild diffuse interstitial prominence concerning for mild interstitial edema. There is no focal consolidation. No pleural effusion or pneumothorax. The central airways are patent. There is mild thickening of the bronchial wall. Clinical correlation is recommended to evaluate for bronchitis. There is a 9 mm nodule in the right middle lobe which appears minimally increased compared to the CT dated 2012. A stable appearing 11 mm nodular density is noted in the right lower lobe. There is atherosclerotic calcification of the thoracic aorta and aortic arch. There is no aortic dissection or aneurysm. No CT  evidence of pulmonary embolism. There is mild dilatation of the left atrium as well as apparent circumferential thickening of the left ventricular wall. Correlation with echocardiogram recommended. There is no pericardial effusion. There is coronary vascular calcification. There is no hilar or mediastinal adenopathy. Small hiatal hernia. The esophagus is grossly unremarkable. There is no axillary adenopathy. The chest wall soft tissues appear unremarkable. There is degenerative changes of the spine. No acute fracture. Multiple small hepatic hypodense lesions are not characterized on this study. There is retrograde flow of contrast into the IVC indicative of a degree of right cardiac dysfunction. Review of the MIP images confirms the above findings. IMPRESSION: No CT evidence of pulmonary embolism. Right middle lobe and right lower lobe pulmonary nodules. There has been minimal interval increase in the size of the right middle lobe nodule since the study dated 2012. Mild dilatation of the left atrium with findings suggestive of a degree of right cardiac dysfunction. Correlation with echocardiogram recommended. Electronically Signed   By: Elgie Collard M.D.   On: 10/22/2015 23:37   Ct Cervical Spine Wo Contrast  10/22/2015  CLINICAL DATA:  Pain following fall. Loss of consciousness after fall EXAM: CT HEAD WITHOUT CONTRAST CT CERVICAL SPINE WITHOUT CONTRAST TECHNIQUE: Multidetector CT imaging of the head and cervical spine was performed following the standard protocol without intravenous contrast. Multiplanar CT image reconstructions of the cervical spine were also generated. COMPARISON:  CT head February 01, 2011; CT cervical spine August 12, 2011 FINDINGS: CT HEAD FINDINGS The ventricles are normal in size and configuration for age. There is no intracranial mass, hemorrhage, extra-axial fluid collection, or midline shift. Gray-white compartments appear normal. No acute infarct evident. There is a the midline  posterior parietal scalp hematoma. The bony calvarium appears intact. Mastoid air cells are clear. There is extensive opacification of most of the maxillary antra bilaterally. There is mild mucosal thickening in several ethmoid air cells bilaterally. Frontal sinuses are hypoplastic. No orbital lesions are evident. CT CERVICAL SPINE FINDINGS There is  no fracture or spondylolisthesis. Prevertebral soft tissues and predental space regions are normal. The disc spaces appear unremarkable. There is no nerve root edema or effacement. No disc extrusion or stenosis. There is mild facet hypertrophy at several levels bilaterally. There is a dominant mass arising from the posterior aspect of the right lobe of the thyroid measuring 1.7 x 1.6 cm, essentially stable from 2013 study. There are foci of carotid artery calcification bilaterally. There is also calcification in the midportion of the right vertebral artery. IMPRESSION: CT: No intracranial mass, hemorrhage, or extra-axial fluid collection. Gray-white compartments appear normal. There is a the fairly prominent posterior parietal scalp hematoma in the midline. No fracture evident. There is extensive maxillary sinus disease bilaterally with mild ethmoid sinus disease bilaterally. CT cervical spine: No fracture or spondylolisthesis. Mild osteoarthritic changes several levels without nerve root edema or effacement. No disc extrusion or stenosis. Extensive arterial vascular calcification noted in both carotid arteries and in the right vertebral artery. There is a dominant mass arising from the posterior aspect the right lobe of the thyroid. This mass was present on prior study from 2013 and has remained stable. Electronically Signed   By: Bretta BangWilliam  Woodruff III M.D.   On: 10/22/2015 19:38   Nm Myocar Multi W/spect W/wall Motion / Ef  10/25/2015  CLINICAL DATA:  Chest pain and syncope. Coronary artery disease and hypertension. EXAM: MYOCARDIAL IMAGING WITH SPECT (REST AND  PHARMACOLOGIC-STRESS) GATED LEFT VENTRICULAR WALL MOTION STUDY LEFT VENTRICULAR EJECTION FRACTION TECHNIQUE: Standard myocardial SPECT imaging was performed after resting intravenous injection of 10 mCi Tc-2135m tetrofosmin. Subsequently, intravenous infusion of Lexiscan was performed under the supervision of the Cardiology staff. At peak effect of the drug, 30 mCi Tc-4835m tetrofosmin was injected intravenously and standard myocardial SPECT imaging was performed. Quantitative gated imaging was also performed to evaluate left ventricular wall motion, and estimate left ventricular ejection fraction. COMPARISON:  None. FINDINGS: Perfusion: No decreased activity in the left ventricle on stress imaging to suggest reversible ischemia or infarction. Wall Motion: Normal left ventricular wall motion. No left ventricular dilation. Left Ventricular Ejection Fraction: 55 % End diastolic volume 92 ml End systolic volume 41 ml IMPRESSION: 1. No reversible ischemia or infarction. 2. Normal left ventricular wall motion. 3. Left ventricular ejection fraction 55% 4. Non invasive risk stratification*: Low *2012 Appropriate Use Criteria for Coronary Revascularization Focused Update: J Am Coll Cardiol. 2012;59(9):857-881. http://content.dementiazones.comonlinejacc.org/article.aspx?articleid=1201161 Electronically Signed   By: Myles RosenthalJohn  Stahl M.D.   On: 10/25/2015 14:57   Dg Chest Port 1 View  10/22/2015  CLINICAL DATA:  Cough and chest pain. EXAM: PORTABLE CHEST 1 VIEW COMPARISON:  03/04/2014 and prior exams FINDINGS: Cardiomegaly and mild peribronchial thickening again noted. A stable right middle lobe nodule is again noted. There is no evidence of focal airspace disease, pulmonary edema, suspicious pulmonary nodule/mass, pleural effusion, or pneumothorax. No acute bony abnormalities are identified. IMPRESSION: Cardiomegaly without evidence of acute cardiopulmonary disease. Electronically Signed   By: Harmon PierJeffrey  Hu M.D.   On: 10/22/2015 18:42     Microbiology: Recent Results (from the past 240 hour(s))  Blood Culture (routine x 2)     Status: None (Preliminary result)   Collection Time: 10/22/15  6:35 PM  Result Value Ref Range Status   Specimen Description BLOOD RIGHT HAND  Final   Special Requests IN PEDIATRIC BOTTLE 2CC  Final   Culture NO GROWTH 3 DAYS  Final   Report Status PENDING  Incomplete  Blood Culture (routine x 2)  Status: None (Preliminary result)   Collection Time: 10/22/15  6:42 PM  Result Value Ref Range Status   Specimen Description BLOOD LEFT HAND  Final   Special Requests BOTTLES DRAWN AEROBIC ONLY 7CC  Final   Culture NO GROWTH 3 DAYS  Final   Report Status PENDING  Incomplete  Urine culture     Status: Abnormal   Collection Time: 10/22/15  7:55 PM  Result Value Ref Range Status   Specimen Description URINE, RANDOM  Final   Special Requests NONE  Final   Culture <10,000 COLONIES/mL INSIGNIFICANT GROWTH (A)  Final   Report Status 10/23/2015 FINAL  Final     Labs: Basic Metabolic Panel:  Recent Labs Lab 10/22/15 1825 10/23/15 0754 10/25/15 0234  NA 139 140 136  K 3.8 4.3 4.1  CL 108 115* 104  CO2 27 22 26   GLUCOSE 106* 80 97  BUN 10 10 8   CREATININE 0.70 0.64 0.62  CALCIUM 8.4* 7.6* 8.3*   Liver Function Tests:  Recent Labs Lab 10/22/15 1825 10/23/15 0754  AST 21 19  ALT 16 14  ALKPHOS 56 42  BILITOT 0.3 0.5  PROT 6.6 5.3*  ALBUMIN 3.5 2.7*   No results for input(s): LIPASE, AMYLASE in the last 168 hours. No results for input(s): AMMONIA in the last 168 hours. CBC:  Recent Labs Lab 10/22/15 1825 10/23/15 0754 10/25/15 0234  WBC 22.4* 9.0 10.1  NEUTROABS 19.3* 5.5  --   HGB 11.9* 9.8* 10.8*  HCT 37.9 31.3* 35.2*  MCV 87.5 88.7 89.1  PLT 297 216 233   Cardiac Enzymes:  Recent Labs Lab 10/23/15 0235 10/23/15 0754 10/23/15 1043  TROPONINI 0.17* 0.16* 0.12*   BNP: BNP (last 3 results)  Recent Labs  10/22/15 1825  BNP 191.6*    ProBNP (last 3  results) No results for input(s): PROBNP in the last 8760 hours.  CBG:  Recent Labs Lab 10/23/15 0637 10/24/15 0600 10/25/15 0622 10/26/15 0648  GLUCAP 88 87 102* 95

## 2015-10-27 LAB — CULTURE, BLOOD (ROUTINE X 2)
Culture: NO GROWTH
Culture: NO GROWTH

## 2015-10-30 LAB — VAS US CAROTID
LCCAPDIAS: 31 cm/s
LCCAPSYS: 158 cm/s
LEFT ECA DIAS: -30 cm/s
LEFT VERTEBRAL DIAS: 30 cm/s
LICADDIAS: -60 cm/s
LICAPSYS: -205 cm/s
Left CCA dist dias: 43 cm/s
Left CCA dist sys: 177 cm/s
Left ICA dist sys: -237 cm/s
Left ICA prox dias: -63 cm/s
RCCADSYS: -92 cm/s
RIGHT CCA MID DIAS: -16 cm/s
RIGHT VERTEBRAL DIAS: -1 cm/s
Right CCA prox dias: 17 cm/s
Right CCA prox sys: 46 cm/s

## 2015-10-31 ENCOUNTER — Ambulatory Visit (INDEPENDENT_AMBULATORY_CARE_PROVIDER_SITE_OTHER): Payer: Medicare Other

## 2015-10-31 DIAGNOSIS — R55 Syncope and collapse: Secondary | ICD-10-CM

## 2015-12-11 ENCOUNTER — Ambulatory Visit: Payer: Medicare HMO | Admitting: Cardiovascular Disease

## 2015-12-13 ENCOUNTER — Telehealth: Payer: Self-pay | Admitting: Cardiovascular Disease

## 2015-12-13 NOTE — Telephone Encounter (Signed)
Left message to call back  

## 2015-12-13 NOTE — Telephone Encounter (Signed)
F/u message ° °pt returning RN call. Please call back to discuss  °

## 2015-12-14 NOTE — Telephone Encounter (Signed)
Spoke with patient and reviewed monitor results with her again. Stated she would call back next week to reschedule her cancelled ov from 12/11/15

## 2015-12-14 NOTE — Telephone Encounter (Signed)
Follow-up     The pt was returning the nurses call from yesterday

## 2016-02-14 ENCOUNTER — Encounter: Payer: Self-pay | Admitting: Gastroenterology

## 2016-02-14 ENCOUNTER — Telehealth: Payer: Self-pay | Admitting: Gastroenterology

## 2016-02-14 ENCOUNTER — Ambulatory Visit (INDEPENDENT_AMBULATORY_CARE_PROVIDER_SITE_OTHER): Payer: Medicare Other | Admitting: Gastroenterology

## 2016-02-14 VITALS — BP 94/50 | HR 76 | Ht 63.75 in | Wt 163.1 lb

## 2016-02-14 DIAGNOSIS — D649 Anemia, unspecified: Secondary | ICD-10-CM

## 2016-02-14 DIAGNOSIS — K625 Hemorrhage of anus and rectum: Secondary | ICD-10-CM

## 2016-02-14 DIAGNOSIS — R1310 Dysphagia, unspecified: Secondary | ICD-10-CM

## 2016-02-14 DIAGNOSIS — R634 Abnormal weight loss: Secondary | ICD-10-CM | POA: Diagnosis not present

## 2016-02-14 MED ORDER — NA SULFATE-K SULFATE-MG SULF 17.5-3.13-1.6 GM/177ML PO SOLN: 1.0000 | Freq: Once | ORAL | 0 refills | Status: AC

## 2016-02-14 NOTE — Patient Instructions (Addendum)
You will be set up for an upper endoscopy for dysphagia, weight loss You will be set up for a colonoscopy for anemia, minor rectal bleeding, weight loss.

## 2016-02-14 NOTE — Progress Notes (Signed)
HPI: This is a  very pleasant 65 year old woman who was referred to me by Barbie BannerWilson, Fred H, MD  to evaluate  anemia .    Chief complaint is anemia, chronic intermittent dysphasia  She had labs done 2-3 weeks ago by PCP Dr. Andrey CampanileWilson and she was told she was anemic.  We do not have those labs here for review.  She has chronic dysphagia, has had EGD dilation many years ago with Dr. Matthias HughsBuccini.  She has intermittent dypshagia about twice per week.  Multiple dilations with Dr. Matthias HughsBuccini, and these usually help.  Can have choking.  Last EGD was 8-10 years ago.    She has never pyrosis but has been taking PPI once daily for many years for "acid reflux"  She's lost 100 pounds in about 2 years.  Labs 10/2015 Hb 10.8 to 11.9 (while in hospital); MCV normal. FOBT negative  Sam ganem EGD 12/2010 (I can see the pictures in epic but no text, report and it doesn't look like any biopsies were taken since no path in epic around that time.  Admitted for 3-4 days in June for syncope  She had colonoscopy 8-9 years ago, she believes she had polyps but they were "ok."  She sees blood in her stool  She says she has dark stool usually, takes iron.  Review of systems: Pertinent positive and negative review of systems were noted in the above HPI section. Complete review of systems was performed and was otherwise normal.   Past Medical History:  Diagnosis Date  . Anxiety   . CAD (coronary artery disease)   . Colon polyps   . Esophageal reflux   . Heart murmur   . HLD (hyperlipidemia)   . HTN (hypertension)   . Hypertension     Past Surgical History:  Procedure Laterality Date  . APPENDECTOMY    . CARDIAC CATHETERIZATION  2006  . LUMBAR DISC SURGERY    . TUMOR REMOVAL Right    ovarian    Current Outpatient Prescriptions  Medication Sig Dispense Refill  . aspirin 81 MG tablet Take 81 mg by mouth daily.    . citalopram (CELEXA) 20 MG tablet Take 20 mg by mouth daily.  3  . HYDROcodone-acetaminophen  (NORCO) 10-325 MG per tablet Take 1 tablet by mouth every 6 (six) hours as needed for moderate pain.     . iron polysaccharides (FERREX 150) 150 MG capsule Take 150 mg by mouth daily.    Marland Kitchen. lisinopril (PRINIVIL,ZESTRIL) 10 MG tablet Take 10 mg by mouth daily.    . meloxicam (MOBIC) 15 MG tablet Take 15 mg by mouth daily as needed (for muscle or joint pain).   2  . omeprazole (PRILOSEC) 20 MG capsule Take 20 mg by mouth daily.    . pravastatin (PRAVACHOL) 80 MG tablet Take 80 mg by mouth daily.  1  . ferrous sulfate 324 (65 Fe) MG TBEC Take 1 tablet by mouth daily.    Marland Kitchen. NITROSTAT 0.4 MG SL tablet Place 0.4 mg under the tongue every 5 (five) minutes as needed for chest pain.      No current facility-administered medications for this visit.     Allergies as of 02/14/2016 - Review Complete 02/14/2016  Allergen Reaction Noted  . Codeine    . Morphine      Family History  Problem Relation Age of Onset  . Stroke Mother   . Prostate cancer Father   . Heart failure Sister   . Prostate  cancer Brother   . Skin cancer Brother     Social History   Social History  . Marital status: Married    Spouse name: N/A  . Number of children: 2  . Years of education: N/A   Occupational History  . housewife    Social History Main Topics  . Smoking status: Current Every Day Smoker    Types: Cigarettes  . Smokeless tobacco: Never Used  . Alcohol use No  . Drug use: No  . Sexual activity: Not on file   Other Topics Concern  . Not on file   Social History Narrative  . No narrative on file     Physical Exam: Ht 5' 3.75" (1.619 m) Comment: height measured without shoes  Wt 74 kg (163 lb 2 oz)   BMI 28.22 kg/m  Constitutional: generally well-appearing Psychiatric: alert and oriented x3 Eyes: extraocular movements intact Mouth: oral pharynx moist, no lesions Neck: supple no lymphadenopathy Cardiovascular: heart regular rate and rhythm Lungs: clear to auscultation bilaterally Abdomen:  soft, nontender, nondistended, no obvious ascites, no peritoneal signs, normal bowel sounds Extremities: no lower extremity edema bilaterally Skin: no lesions on visible extremities   Assessment and plan: 65 y.o. female with  Mild Normocytic anemia, intermittent solid food dysphagia, significant unintentional weight loss  She has lost about 100 pound since the death of her husband and his illness about a year ago. She has intermittent solid food dysphagia, has had upper endoscopies multiple times in the past by [facility] gastroenterology but cannot see them again for overdue bills. I recommended we proceed with EGD at her soonest convenience and I will proceed with endoscopic dilation if there is a stricture seen. She has mild normocytic anemia. She sees red blood intermittently in her last colonoscopy was 9-10 years ago by [facility] gastroenterology. I recommended the same time as her upper endoscopy that we repeat colonoscopy check for significant advanced lesions.   Rob Bunting, MD [facility] 02/14/2016, 11:05 AM  Cc: Barbie Banner, MD

## 2016-02-14 NOTE — Telephone Encounter (Signed)
Pt advised that $45 is about the cheapest she can do.  She agreed to pick up and will call with any further concerns

## 2016-03-01 ENCOUNTER — Encounter: Payer: Self-pay | Admitting: Gastroenterology

## 2016-03-07 ENCOUNTER — Emergency Department (HOSPITAL_COMMUNITY): Payer: Medicare Other

## 2016-03-07 ENCOUNTER — Inpatient Hospital Stay (HOSPITAL_COMMUNITY)
Admission: EM | Admit: 2016-03-07 | Discharge: 2016-04-19 | DRG: 003 | Disposition: E | Payer: Medicare Other | Attending: General Surgery | Admitting: General Surgery

## 2016-03-07 ENCOUNTER — Inpatient Hospital Stay (HOSPITAL_COMMUNITY): Payer: Medicare Other

## 2016-03-07 DIAGNOSIS — I5023 Acute on chronic systolic (congestive) heart failure: Secondary | ICD-10-CM | POA: Diagnosis not present

## 2016-03-07 DIAGNOSIS — S42002A Fracture of unspecified part of left clavicle, initial encounter for closed fracture: Secondary | ICD-10-CM

## 2016-03-07 DIAGNOSIS — R000 Tachycardia, unspecified: Secondary | ICD-10-CM | POA: Diagnosis not present

## 2016-03-07 DIAGNOSIS — R570 Cardiogenic shock: Secondary | ICD-10-CM

## 2016-03-07 DIAGNOSIS — S62304A Unspecified fracture of fourth metacarpal bone, right hand, initial encounter for closed fracture: Secondary | ICD-10-CM | POA: Diagnosis present

## 2016-03-07 DIAGNOSIS — I5021 Acute systolic (congestive) heart failure: Secondary | ICD-10-CM | POA: Diagnosis present

## 2016-03-07 DIAGNOSIS — S12100A Unspecified displaced fracture of second cervical vertebra, initial encounter for closed fracture: Secondary | ICD-10-CM | POA: Diagnosis present

## 2016-03-07 DIAGNOSIS — S2243XS Multiple fractures of ribs, bilateral, sequela: Secondary | ICD-10-CM

## 2016-03-07 DIAGNOSIS — J9690 Respiratory failure, unspecified, unspecified whether with hypoxia or hypercapnia: Secondary | ICD-10-CM

## 2016-03-07 DIAGNOSIS — R7989 Other specified abnormal findings of blood chemistry: Secondary | ICD-10-CM

## 2016-03-07 DIAGNOSIS — I429 Cardiomyopathy, unspecified: Secondary | ICD-10-CM

## 2016-03-07 DIAGNOSIS — E870 Hyperosmolality and hypernatremia: Secondary | ICD-10-CM | POA: Diagnosis not present

## 2016-03-07 DIAGNOSIS — Z515 Encounter for palliative care: Secondary | ICD-10-CM | POA: Diagnosis not present

## 2016-03-07 DIAGNOSIS — R571 Hypovolemic shock: Secondary | ICD-10-CM | POA: Diagnosis present

## 2016-03-07 DIAGNOSIS — J9620 Acute and chronic respiratory failure, unspecified whether with hypoxia or hypercapnia: Secondary | ICD-10-CM | POA: Diagnosis not present

## 2016-03-07 DIAGNOSIS — D649 Anemia, unspecified: Secondary | ICD-10-CM

## 2016-03-07 DIAGNOSIS — S72461A Displaced supracondylar fracture with intracondylar extension of lower end of right femur, initial encounter for closed fracture: Secondary | ICD-10-CM | POA: Diagnosis present

## 2016-03-07 DIAGNOSIS — I214 Non-ST elevation (NSTEMI) myocardial infarction: Secondary | ICD-10-CM

## 2016-03-07 DIAGNOSIS — I5043 Acute on chronic combined systolic (congestive) and diastolic (congestive) heart failure: Secondary | ICD-10-CM | POA: Diagnosis not present

## 2016-03-07 DIAGNOSIS — S2241XA Multiple fractures of ribs, right side, initial encounter for closed fracture: Secondary | ICD-10-CM | POA: Diagnosis present

## 2016-03-07 DIAGNOSIS — F329 Major depressive disorder, single episode, unspecified: Secondary | ICD-10-CM | POA: Diagnosis present

## 2016-03-07 DIAGNOSIS — S2249XA Multiple fractures of ribs, unspecified side, initial encounter for closed fracture: Secondary | ICD-10-CM | POA: Diagnosis present

## 2016-03-07 DIAGNOSIS — E669 Obesity, unspecified: Secondary | ICD-10-CM | POA: Diagnosis present

## 2016-03-07 DIAGNOSIS — Z6836 Body mass index (BMI) 36.0-36.9, adult: Secondary | ICD-10-CM

## 2016-03-07 DIAGNOSIS — L8995 Pressure ulcer of unspecified site, unstageable: Secondary | ICD-10-CM | POA: Diagnosis not present

## 2016-03-07 DIAGNOSIS — J90 Pleural effusion, not elsewhere classified: Secondary | ICD-10-CM

## 2016-03-07 DIAGNOSIS — F17210 Nicotine dependence, cigarettes, uncomplicated: Secondary | ICD-10-CM | POA: Diagnosis present

## 2016-03-07 DIAGNOSIS — R402412 Glasgow coma scale score 13-15, at arrival to emergency department: Secondary | ICD-10-CM | POA: Diagnosis present

## 2016-03-07 DIAGNOSIS — S299XXA Unspecified injury of thorax, initial encounter: Secondary | ICD-10-CM

## 2016-03-07 DIAGNOSIS — S7290XA Unspecified fracture of unspecified femur, initial encounter for closed fracture: Secondary | ICD-10-CM

## 2016-03-07 DIAGNOSIS — D62 Acute posthemorrhagic anemia: Secondary | ICD-10-CM | POA: Diagnosis present

## 2016-03-07 DIAGNOSIS — Y92410 Unspecified street and highway as the place of occurrence of the external cause: Secondary | ICD-10-CM | POA: Diagnosis not present

## 2016-03-07 DIAGNOSIS — S12500A Unspecified displaced fracture of sixth cervical vertebra, initial encounter for closed fracture: Secondary | ICD-10-CM | POA: Diagnosis present

## 2016-03-07 DIAGNOSIS — K219 Gastro-esophageal reflux disease without esophagitis: Secondary | ICD-10-CM | POA: Diagnosis present

## 2016-03-07 DIAGNOSIS — S129XXA Fracture of neck, unspecified, initial encounter: Secondary | ICD-10-CM

## 2016-03-07 DIAGNOSIS — R52 Pain, unspecified: Secondary | ICD-10-CM

## 2016-03-07 DIAGNOSIS — L8990 Pressure ulcer of unspecified site, unspecified stage: Secondary | ICD-10-CM | POA: Insufficient documentation

## 2016-03-07 DIAGNOSIS — Z419 Encounter for procedure for purposes other than remedying health state, unspecified: Secondary | ICD-10-CM

## 2016-03-07 DIAGNOSIS — I21A1 Myocardial infarction type 2: Secondary | ICD-10-CM | POA: Diagnosis present

## 2016-03-07 DIAGNOSIS — Y95 Nosocomial condition: Secondary | ICD-10-CM | POA: Diagnosis not present

## 2016-03-07 DIAGNOSIS — S72141A Displaced intertrochanteric fracture of right femur, initial encounter for closed fracture: Principal | ICD-10-CM | POA: Diagnosis present

## 2016-03-07 DIAGNOSIS — J156 Pneumonia due to other aerobic Gram-negative bacteria: Secondary | ICD-10-CM | POA: Diagnosis not present

## 2016-03-07 DIAGNOSIS — E876 Hypokalemia: Secondary | ICD-10-CM | POA: Diagnosis present

## 2016-03-07 DIAGNOSIS — J9600 Acute respiratory failure, unspecified whether with hypoxia or hypercapnia: Secondary | ICD-10-CM

## 2016-03-07 DIAGNOSIS — F419 Anxiety disorder, unspecified: Secondary | ICD-10-CM | POA: Diagnosis present

## 2016-03-07 DIAGNOSIS — S2691XA Contusion of heart, unspecified with or without hemopericardium, initial encounter: Secondary | ICD-10-CM | POA: Diagnosis present

## 2016-03-07 DIAGNOSIS — S2242XA Multiple fractures of ribs, left side, initial encounter for closed fracture: Secondary | ICD-10-CM | POA: Diagnosis present

## 2016-03-07 DIAGNOSIS — I2510 Atherosclerotic heart disease of native coronary artery without angina pectoris: Secondary | ICD-10-CM | POA: Diagnosis present

## 2016-03-07 DIAGNOSIS — I420 Dilated cardiomyopathy: Secondary | ICD-10-CM | POA: Diagnosis not present

## 2016-03-07 DIAGNOSIS — Z955 Presence of coronary angioplasty implant and graft: Secondary | ICD-10-CM

## 2016-03-07 DIAGNOSIS — S270XXA Traumatic pneumothorax, initial encounter: Secondary | ICD-10-CM | POA: Diagnosis present

## 2016-03-07 DIAGNOSIS — J849 Interstitial pulmonary disease, unspecified: Secondary | ICD-10-CM | POA: Diagnosis present

## 2016-03-07 DIAGNOSIS — J939 Pneumothorax, unspecified: Secondary | ICD-10-CM

## 2016-03-07 DIAGNOSIS — I471 Supraventricular tachycardia: Secondary | ICD-10-CM | POA: Diagnosis not present

## 2016-03-07 DIAGNOSIS — Z86010 Personal history of colonic polyps: Secondary | ICD-10-CM

## 2016-03-07 DIAGNOSIS — J9811 Atelectasis: Secondary | ICD-10-CM | POA: Diagnosis not present

## 2016-03-07 DIAGNOSIS — Z79899 Other long term (current) drug therapy: Secondary | ICD-10-CM

## 2016-03-07 DIAGNOSIS — Z7982 Long term (current) use of aspirin: Secondary | ICD-10-CM

## 2016-03-07 DIAGNOSIS — Z4659 Encounter for fitting and adjustment of other gastrointestinal appliance and device: Secondary | ICD-10-CM

## 2016-03-07 DIAGNOSIS — Z930 Tracheostomy status: Secondary | ICD-10-CM

## 2016-03-07 DIAGNOSIS — S72401A Unspecified fracture of lower end of right femur, initial encounter for closed fracture: Secondary | ICD-10-CM

## 2016-03-07 DIAGNOSIS — R079 Chest pain, unspecified: Secondary | ICD-10-CM | POA: Diagnosis not present

## 2016-03-07 DIAGNOSIS — M25551 Pain in right hip: Secondary | ICD-10-CM | POA: Diagnosis present

## 2016-03-07 DIAGNOSIS — I110 Hypertensive heart disease with heart failure: Secondary | ICD-10-CM | POA: Diagnosis present

## 2016-03-07 DIAGNOSIS — G8911 Acute pain due to trauma: Secondary | ICD-10-CM

## 2016-03-07 DIAGNOSIS — T1490XA Injury, unspecified, initial encounter: Secondary | ICD-10-CM

## 2016-03-07 DIAGNOSIS — Z8781 Personal history of (healed) traumatic fracture: Secondary | ICD-10-CM

## 2016-03-07 DIAGNOSIS — R739 Hyperglycemia, unspecified: Secondary | ICD-10-CM | POA: Diagnosis present

## 2016-03-07 DIAGNOSIS — Z01818 Encounter for other preprocedural examination: Secondary | ICD-10-CM

## 2016-03-07 DIAGNOSIS — Z9889 Other specified postprocedural states: Secondary | ICD-10-CM

## 2016-03-07 DIAGNOSIS — Z66 Do not resuscitate: Secondary | ICD-10-CM | POA: Diagnosis not present

## 2016-03-07 DIAGNOSIS — S72491A Other fracture of lower end of right femur, initial encounter for closed fracture: Secondary | ICD-10-CM | POA: Diagnosis not present

## 2016-03-07 DIAGNOSIS — E785 Hyperlipidemia, unspecified: Secondary | ICD-10-CM | POA: Diagnosis present

## 2016-03-07 DIAGNOSIS — I739 Peripheral vascular disease, unspecified: Secondary | ICD-10-CM | POA: Diagnosis present

## 2016-03-07 DIAGNOSIS — Z9911 Dependence on respirator [ventilator] status: Secondary | ICD-10-CM

## 2016-03-07 DIAGNOSIS — S2243XG Multiple fractures of ribs, bilateral, subsequent encounter for fracture with delayed healing: Secondary | ICD-10-CM

## 2016-03-07 LAB — PREPARE FRESH FROZEN PLASMA
Unit division: 0
Unit division: 0

## 2016-03-07 LAB — CBC WITH DIFFERENTIAL/PLATELET
BASOS ABS: 0 10*3/uL (ref 0.0–0.1)
Basophils Relative: 0 %
EOS PCT: 0 %
Eosinophils Absolute: 0 10*3/uL (ref 0.0–0.7)
HEMATOCRIT: 30.4 % — AB (ref 36.0–46.0)
HEMOGLOBIN: 9.7 g/dL — AB (ref 12.0–15.0)
LYMPHS ABS: 2.1 10*3/uL (ref 0.7–4.0)
LYMPHS PCT: 9 %
MCH: 26.2 pg (ref 26.0–34.0)
MCHC: 31.9 g/dL (ref 30.0–36.0)
MCV: 82.2 fL (ref 78.0–100.0)
MONOS PCT: 10 %
Monocytes Absolute: 2.3 10*3/uL — ABNORMAL HIGH (ref 0.1–1.0)
Neutro Abs: 18.6 10*3/uL — ABNORMAL HIGH (ref 1.7–7.7)
Neutrophils Relative %: 81 %
Platelets: 270 10*3/uL (ref 150–400)
RBC: 3.7 MIL/uL — AB (ref 3.87–5.11)
RDW: 16.1 % — AB (ref 11.5–15.5)
WBC: 23 10*3/uL — ABNORMAL HIGH (ref 4.0–10.5)

## 2016-03-07 LAB — CBC
HCT: 20.5 % — ABNORMAL LOW (ref 36.0–46.0)
Hemoglobin: 6.1 g/dL — CL (ref 12.0–15.0)
MCH: 22.9 pg — AB (ref 26.0–34.0)
MCHC: 29.8 g/dL — ABNORMAL LOW (ref 30.0–36.0)
MCV: 77.1 fL — AB (ref 78.0–100.0)
PLATELETS: 368 10*3/uL (ref 150–400)
RBC: 2.66 MIL/uL — AB (ref 3.87–5.11)
RDW: 16 % — AB (ref 11.5–15.5)
WBC: 23.1 10*3/uL — ABNORMAL HIGH (ref 4.0–10.5)

## 2016-03-07 LAB — I-STAT CHEM 8, ED
BUN: 14 mg/dL (ref 6–20)
Calcium, Ion: 0.93 mmol/L — ABNORMAL LOW (ref 1.15–1.40)
Chloride: 112 mmol/L — ABNORMAL HIGH (ref 101–111)
Creatinine, Ser: 0.6 mg/dL (ref 0.44–1.00)
Glucose, Bld: 209 mg/dL — ABNORMAL HIGH (ref 65–99)
HCT: 19 % — ABNORMAL LOW (ref 36.0–46.0)
HEMOGLOBIN: 6.5 g/dL — AB (ref 12.0–15.0)
POTASSIUM: 3 mmol/L — AB (ref 3.5–5.1)
SODIUM: 144 mmol/L (ref 135–145)
TCO2: 19 mmol/L (ref 0–100)

## 2016-03-07 LAB — COMPREHENSIVE METABOLIC PANEL
ALBUMIN: 2.4 g/dL — AB (ref 3.5–5.0)
ALT: 17 U/L (ref 14–54)
AST: 35 U/L (ref 15–41)
Alkaline Phosphatase: 41 U/L (ref 38–126)
Anion gap: 6 (ref 5–15)
BUN: 13 mg/dL (ref 6–20)
CHLORIDE: 113 mmol/L — AB (ref 101–111)
CO2: 21 mmol/L — AB (ref 22–32)
CREATININE: 0.79 mg/dL (ref 0.44–1.00)
Calcium: 7.1 mg/dL — ABNORMAL LOW (ref 8.9–10.3)
GFR calc Af Amer: >60 mL/min (ref >60)
GFR calc non Af Amer: >60 mL/min (ref >60)
GLUCOSE: 222 mg/dL — AB (ref 65–99)
Potassium: 3 mmol/L — ABNORMAL LOW (ref 3.5–5.1)
SODIUM: 140 mmol/L (ref 135–145)
Total Bilirubin: 0.4 mg/dL (ref 0.3–1.2)
Total Protein: 4.6 g/dL — ABNORMAL LOW (ref 6.5–8.1)

## 2016-03-07 LAB — ETHANOL: Alcohol, Ethyl (B): <5 mg/dL (ref <5)

## 2016-03-07 LAB — ABO/RH: ABO/RH(D): A POS

## 2016-03-07 LAB — PROTIME-INR
INR: 1.34
PROTHROMBIN TIME: 16.7 s — AB (ref 11.4–15.2)

## 2016-03-07 LAB — TROPONIN I: TROPONIN I: 0.08 ng/mL — AB (ref <0.03)

## 2016-03-07 LAB — I-STAT CG4 LACTIC ACID, ED: Lactic Acid, Venous: 2.88 mmol/L (ref 0.5–1.9)

## 2016-03-07 MED ORDER — SODIUM CHLORIDE 0.9 % IV BOLUS (SEPSIS)
1000.0000 mL | Freq: Once | INTRAVENOUS | Status: AC
  Administered 2016-03-07: 1000 mL via INTRAVENOUS

## 2016-03-07 MED ORDER — IOPAMIDOL (ISOVUE-370) INJECTION 76%
INTRAVENOUS | Status: AC
  Administered 2016-03-07: 50 mL
  Filled 2016-03-07: qty 50

## 2016-03-07 MED ORDER — FENTANYL CITRATE (PF) 100 MCG/2ML IJ SOLN
25.0000 ug | Freq: Once | INTRAMUSCULAR | Status: AC
  Administered 2016-03-07: 25 ug via INTRAVENOUS

## 2016-03-07 MED ORDER — SODIUM CHLORIDE 0.9 % IV SOLN: Freq: Once | INTRAVENOUS | Status: DC

## 2016-03-07 MED ORDER — SODIUM CHLORIDE 0.9 % IV SOLN
INTRAVENOUS | Status: DC
  Administered 2016-03-07: 21:00:00 via INTRAVENOUS

## 2016-03-07 MED ORDER — FENTANYL CITRATE (PF) 100 MCG/2ML IJ SOLN
INTRAMUSCULAR | Status: AC
  Administered 2016-03-07: 25 ug via INTRAVENOUS
  Filled 2016-03-07: qty 2

## 2016-03-07 MED ORDER — ASPIRIN 81 MG PO CHEW
324.0000 mg | CHEWABLE_TABLET | Freq: Once | ORAL | Status: AC
  Administered 2016-03-07: 324 mg via ORAL
  Filled 2016-03-07: qty 4

## 2016-03-07 MED ORDER — IOPAMIDOL (ISOVUE-300) INJECTION 61%
100.0000 mL | Freq: Once | INTRAVENOUS | Status: AC | PRN
  Administered 2016-03-07: 100 mL via INTRAVENOUS

## 2016-03-07 NOTE — Progress Notes (Signed)
Orthopedic Tech Progress Note Patient Details:  Jasmine HouseholderBrenda A Ayala 1950-06-05 [id number]  Ortho Devices Type of Ortho Device: Knee Immobilizer Ortho Device/Splint Location: RLE Ortho Device/Splint Interventions: Ordered, Application   [name], Yanett Conkright Craig 01/24/2016, 10:51 PM

## 2016-03-07 NOTE — ED Notes (Signed)
Pt to CT with this nurse.

## 2016-03-07 NOTE — ED Notes (Signed)
O neg blood started wide open in central line
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
## 2016-03-07 NOTE — ED Notes (Signed)
Dr. Manus Gunningancour notified of pt troponin.

## 2016-03-07 NOTE — ED Notes (Signed)
Pt transported to Xray with this nurse. BP 107/91
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
## 2016-03-07 NOTE — ED Notes (Signed)
Attempted to obtain multiple manual BP measurements. Just now got manual.

## 2016-03-07 NOTE — ED Notes (Signed)
Pt transported to and from CT with this nurse, no issues.

## 2016-03-07 NOTE — ED Notes (Signed)
Dr. Andrey CampanileWilson attempting central line
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
## 2016-03-07 NOTE — Progress Notes (Signed)
Orthopedic Tech Progress Note Patient Details:  [name] [birth year] [id number] Level 1 trauma ortho visit. Patient ID: Jasmine HouseholderBrenda A Ayala, female   DOB: [birth year], 65 y.o.   MRN: [id number]   [name], [name] 03/05/2016, 9:26 PM

## 2016-03-07 NOTE — Progress Notes (Signed)
Central Venous Catheter Insertion Procedure Note Darl HouseholderBrenda A Flemister [id number] [birth year]  Procedure: Insertion of Central Venous Catheter Indications: hypotension  Procedure Details Consent: Unable to obtain consent because of emergent medical necessity. Time Out: Verified patient identification, verified procedure, site/side was marked, verified correct patient position, special equipment/implants available, medications/allergies/relevent history reviewed, required imaging and test results available.  Performed  Maximum sterile technique was used including antiseptics, gloves, hand hygiene and sheet. Skin prep: Chlorhexidine; local anesthetic administered A antimicrobial bonded/coated [id number]  triple lumen catheter was placed in the right femoral vein due to emergent situation using the Seldinger technique.  Evaluation Blood flow good Complications: No apparent complications Patient did tolerate procedure well. Chest X-ray ordered to verify placement.  CXR: n/a.  Gaynelle AduWILSON,Dereon Corkery M 02/26/2016, 9:56 PM

## 2016-03-07 NOTE — Progress Notes (Signed)
   02/27/2016 2000  Clinical Encounter Type  Visited With Family  Visit Type Initial;ED;Trauma  Spiritual Encounters  Spiritual Needs Emotional  Stress Factors  Patient Stress Factors None identified  Family Stress Factors Family relationships;Financial concerns  Chaplain responded to page from Ed. PT son was available, transported to consult room and back to pt once stable.

## 2016-03-08 ENCOUNTER — Encounter (HOSPITAL_COMMUNITY): Payer: Self-pay | Admitting: *Deleted

## 2016-03-08 ENCOUNTER — Inpatient Hospital Stay (HOSPITAL_COMMUNITY): Payer: Medicare Other

## 2016-03-08 DIAGNOSIS — I214 Non-ST elevation (NSTEMI) myocardial infarction: Secondary | ICD-10-CM

## 2016-03-08 DIAGNOSIS — R570 Cardiogenic shock: Secondary | ICD-10-CM

## 2016-03-08 LAB — COMPREHENSIVE METABOLIC PANEL
ALT: 44 U/L (ref 14–54)
AST: 124 U/L — AB (ref 15–41)
Albumin: 2.7 g/dL — ABNORMAL LOW (ref 3.5–5.0)
Alkaline Phosphatase: 41 U/L (ref 38–126)
Anion gap: 5 (ref 5–15)
BUN: 15 mg/dL (ref 6–20)
CHLORIDE: 115 mmol/L — AB (ref 101–111)
CO2: 23 mmol/L (ref 22–32)
CREATININE: 0.82 mg/dL (ref 0.44–1.00)
Calcium: 7.4 mg/dL — ABNORMAL LOW (ref 8.9–10.3)
GFR calc Af Amer: >60 mL/min (ref >60)
GFR calc non Af Amer: >60 mL/min (ref >60)
Glucose, Bld: 163 mg/dL — ABNORMAL HIGH (ref 65–99)
Potassium: 4.3 mmol/L (ref 3.5–5.1)
SODIUM: 143 mmol/L (ref 135–145)
Total Bilirubin: 1.1 mg/dL (ref 0.3–1.2)
Total Protein: 5.3 g/dL — ABNORMAL LOW (ref 6.5–8.1)

## 2016-03-08 LAB — CBC
HCT: 33.3 % — ABNORMAL LOW (ref 36.0–46.0)
HCT: 28.8 % — ABNORMAL LOW (ref 36.0–46.0)
HEMOGLOBIN: 9.3 g/dL — AB (ref 12.0–15.0)
Hemoglobin: 10.9 g/dL — ABNORMAL LOW (ref 12.0–15.0)
MCH: 26.9 pg (ref 26.0–34.0)
MCH: 26.4 pg (ref 26.0–34.0)
MCHC: 32.7 g/dL (ref 30.0–36.0)
MCHC: 32.3 g/dL (ref 30.0–36.0)
MCV: 82.2 fL (ref 78.0–100.0)
MCV: 81.8 fL (ref 78.0–100.0)
PLATELETS: 202 10*3/uL (ref 150–400)
Platelets: 189 10*3/uL (ref 150–400)
RBC: 4.05 MIL/uL (ref 3.87–5.11)
RBC: 3.52 MIL/uL — AB (ref 3.87–5.11)
RDW: 15.2 % (ref 11.5–15.5)
RDW: 15.4 % (ref 11.5–15.5)
WBC: 16.3 10*3/uL — AB (ref 4.0–10.5)
WBC: 14.9 10*3/uL — AB (ref 4.0–10.5)

## 2016-03-08 LAB — APTT: aPTT: 29 seconds (ref 24–36)

## 2016-03-08 LAB — LACTIC ACID, PLASMA: Lactic Acid, Venous: 1.6 mmol/L (ref 0.5–1.9)

## 2016-03-08 LAB — URINALYSIS, ROUTINE W REFLEX MICROSCOPIC
BILIRUBIN URINE: NEGATIVE
Glucose, UA: 100 mg/dL — AB
KETONES UR: NEGATIVE mg/dL
LEUKOCYTES UA: NEGATIVE
NITRITE: NEGATIVE
PH: 5.5 (ref 5.0–8.0)
Protein, ur: 100 mg/dL — AB
Specific Gravity, Urine: 1.015 (ref 1.005–1.030)

## 2016-03-08 LAB — URINE MICROSCOPIC-ADD ON

## 2016-03-08 LAB — CDS SEROLOGY

## 2016-03-08 LAB — MRSA PCR SCREENING: MRSA BY PCR: NEGATIVE

## 2016-03-08 LAB — PROTIME-INR
INR: 1.27
Prothrombin Time: 16 seconds — ABNORMAL HIGH (ref 11.4–15.2)

## 2016-03-08 LAB — BLOOD PRODUCT ORDER (VERBAL) VERIFICATION

## 2016-03-08 LAB — TROPONIN I: TROPONIN I: 24.55 ng/mL — AB (ref <0.03)

## 2016-03-08 MED ORDER — HYDROMORPHONE HCL 1 MG/ML IJ SOLN
INTRAMUSCULAR | Status: AC
  Filled 2016-03-08: qty 1

## 2016-03-08 MED ORDER — POTASSIUM CHLORIDE IN NACL 20-0.9 MEQ/L-% IV SOLN
INTRAVENOUS | Status: DC
Start: 2016-03-08 — End: 2016-03-12
  Administered 2016-03-08 – 2016-03-11 (×5): via INTRAVENOUS
  Filled 2016-03-08 (×10): qty 1000

## 2016-03-08 MED ORDER — FAMOTIDINE IN NACL 20-0.9 MG/50ML-% IV SOLN
20.0000 mg | Freq: Two times a day (BID) | INTRAVENOUS | Status: DC
  Administered 2016-03-08 – 2016-03-11 (×9): 20 mg via INTRAVENOUS
  Filled 2016-03-08 (×9): qty 50

## 2016-03-08 MED ORDER — ALBUMIN HUMAN 5 % IV SOLN
12.5000 g | Freq: Once | INTRAVENOUS | Status: AC
  Administered 2016-03-08: 12.5 g via INTRAVENOUS
  Filled 2016-03-08: qty 250

## 2016-03-08 MED ORDER — NITROGLYCERIN 0.4 MG SL SUBL
0.4000 mg | SUBLINGUAL_TABLET | SUBLINGUAL | Status: DC | PRN
  Administered 2016-03-08 (×3): 0.4 mg via SUBLINGUAL
  Filled 2016-03-08: qty 1

## 2016-03-08 MED ORDER — FENTANYL CITRATE (PF) 100 MCG/2ML IJ SOLN
25.0000 ug | INTRAMUSCULAR | Status: DC | PRN
  Administered 2016-03-08 (×2): 50 ug via INTRAVENOUS
  Administered 2016-03-08 – 2016-03-09 (×4): 75 ug via INTRAVENOUS
  Administered 2016-03-09: 50 ug via INTRAVENOUS
  Administered 2016-03-09: 75 ug via INTRAVENOUS
  Administered 2016-03-09: 50 ug via INTRAVENOUS
  Administered 2016-03-10: 75 ug via INTRAVENOUS
  Administered 2016-03-10: 50 ug via INTRAVENOUS
  Administered 2016-03-10: 75 ug via INTRAVENOUS
  Administered 2016-03-10 (×2): 50 ug via INTRAVENOUS
  Administered 2016-03-11: 25 ug via INTRAVENOUS
  Administered 2016-03-11: 50 ug via INTRAVENOUS
  Administered 2016-03-11 (×2): 75 ug via INTRAVENOUS
  Administered 2016-03-14 (×2): 50 ug via INTRAVENOUS
  Administered 2016-03-14 – 2016-03-17 (×14): 75 ug via INTRAVENOUS
  Administered 2016-04-02: 25 ug via INTRAVENOUS
  Filled 2016-03-08 (×35): qty 2

## 2016-03-08 MED ORDER — NITROGLYCERIN 0.4 MG SL SUBL
SUBLINGUAL_TABLET | SUBLINGUAL | Status: AC
  Filled 2016-03-08: qty 1

## 2016-03-08 MED ORDER — ACETAMINOPHEN 325 MG PO TABS
650.0000 mg | ORAL_TABLET | Freq: Four times a day (QID) | ORAL | Status: AC
  Administered 2016-03-08 – 2016-03-09 (×7): 650 mg via ORAL
  Filled 2016-03-08 (×7): qty 2

## 2016-03-08 MED ORDER — ONDANSETRON HCL 4 MG/2ML IJ SOLN
4.0000 mg | Freq: Four times a day (QID) | INTRAMUSCULAR | Status: DC | PRN
  Filled 2016-03-08: qty 2

## 2016-03-08 MED ORDER — OXYCODONE HCL 5 MG PO TABS
5.0000 mg | ORAL_TABLET | ORAL | Status: DC | PRN
  Administered 2016-03-08 – 2016-04-03 (×3): 10 mg via ORAL
  Filled 2016-03-08 (×3): qty 2

## 2016-03-08 MED ORDER — HYDROMORPHONE HCL 1 MG/ML IJ SOLN
1.0000 mg | INTRAMUSCULAR | Status: DC | PRN
Start: 2016-03-08 — End: 2016-04-03
  Administered 2016-03-08 – 2016-04-03 (×30): 1 mg via INTRAVENOUS
  Filled 2016-03-08 (×30): qty 1

## 2016-03-08 MED ORDER — ONDANSETRON HCL 4 MG PO TABS: 4.0000 mg | ORAL_TABLET | Freq: Four times a day (QID) | ORAL | Status: DC | PRN
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
## 2016-03-08 NOTE — Progress Notes (Signed)
Orthopedic Tech Progress Note Patient Details:  Darl HouseholderBrenda A Mcclurkin 09-08-1950 [id number]  Ortho Devices Type of Ortho Device: Velcro wrist splint Ortho Device/Splint Location: rue Ortho Device/Splint Interventions: Application   Caleah Tortorelli 03/08/2016, 10:21 AM
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
## 2016-03-08 NOTE — Progress Notes (Signed)
Hand  Surgery  Patient with closed R 4 MC diaphyseal fx.  Provisionally splinted.  Will formally evaluate her later today or tomorrow to arrive at a plan for treatment that dovetails with other issues, both medical and orthopaedic.  Neil [facility] Mobile [phone]

## 2016-03-09 ENCOUNTER — Encounter (HOSPITAL_COMMUNITY): Payer: Self-pay | Admitting: *Deleted

## 2016-03-09 ENCOUNTER — Inpatient Hospital Stay (HOSPITAL_COMMUNITY): Payer: Medicare Other

## 2016-03-09 LAB — BASIC METABOLIC PANEL
Anion gap: 3 — ABNORMAL LOW (ref 5–15)
BUN: 12 mg/dL (ref 6–20)
CHLORIDE: 113 mmol/L — AB (ref 101–111)
CO2: 24 mmol/L (ref 22–32)
CREATININE: 0.7 mg/dL (ref 0.44–1.00)
Calcium: 7.8 mg/dL — ABNORMAL LOW (ref 8.9–10.3)
GFR calc Af Amer: >60 mL/min (ref >60)
GFR calc non Af Amer: >60 mL/min (ref >60)
Glucose, Bld: 125 mg/dL — ABNORMAL HIGH (ref 65–99)
POTASSIUM: 4.5 mmol/L (ref 3.5–5.1)
Sodium: 140 mmol/L (ref 135–145)

## 2016-03-09 LAB — CBC
HEMATOCRIT: 25.9 % — AB (ref 36.0–46.0)
HEMOGLOBIN: 8.5 g/dL — AB (ref 12.0–15.0)
MCH: 27 pg (ref 26.0–34.0)
MCHC: 32.8 g/dL (ref 30.0–36.0)
MCV: 82.2 fL (ref 78.0–100.0)
Platelets: 166 10*3/uL (ref 150–400)
RBC: 3.15 MIL/uL — AB (ref 3.87–5.11)
RDW: 16.1 % — ABNORMAL HIGH (ref 11.5–15.5)
WBC: 15.8 10*3/uL — ABNORMAL HIGH (ref 4.0–10.5)

## 2016-03-09 LAB — BLOOD GAS, ARTERIAL
Acid-base deficit: 3.2 mmol/L — ABNORMAL HIGH (ref 0.0–2.0)
Bicarbonate: 21.3 mmol/L (ref 20.0–28.0)
Drawn by: 313941
FIO2: 50
O2 SAT: 92.5 %
PATIENT TEMPERATURE: 99.2
PCO2 ART: 38.2 mmHg (ref 32.0–48.0)
PO2 ART: 67.1 mmHg — AB (ref 83.0–108.0)
pH, Arterial: 7.366 (ref 7.350–7.450)

## 2016-03-09 LAB — TROPONIN I
TROPONIN I: 10.55 ng/mL — AB (ref <0.03)
TROPONIN I: 9.23 ng/mL — AB (ref <0.03)
Troponin I: 10.02 ng/mL (ref <0.03)

## 2016-03-09 NOTE — Progress Notes (Signed)
Awaiting echo results  - will provide further recommendations afterwards. Troponin is trending downward.  Chrystie NoseKenneth C. Israella Hubert, MD, [facility] Attending Cardiologist [facility]
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
## 2016-03-10 ENCOUNTER — Inpatient Hospital Stay (HOSPITAL_COMMUNITY): Payer: Medicare Other

## 2016-03-10 DIAGNOSIS — I5021 Acute systolic (congestive) heart failure: Secondary | ICD-10-CM | POA: Diagnosis present

## 2016-03-10 DIAGNOSIS — R079 Chest pain, unspecified: Secondary | ICD-10-CM

## 2016-03-10 LAB — PROTIME-INR
INR: 1.28
PROTHROMBIN TIME: 16.1 s — AB (ref 11.4–15.2)

## 2016-03-10 LAB — BASIC METABOLIC PANEL
Anion gap: 7 (ref 5–15)
BUN: 10 mg/dL (ref 6–20)
CHLORIDE: 111 mmol/L (ref 101–111)
CO2: 22 mmol/L (ref 22–32)
CREATININE: 0.61 mg/dL (ref 0.44–1.00)
Calcium: 8 mg/dL — ABNORMAL LOW (ref 8.9–10.3)
GFR calc Af Amer: >60 mL/min (ref >60)
GFR calc non Af Amer: >60 mL/min (ref >60)
GLUCOSE: 130 mg/dL — AB (ref 65–99)
Potassium: 4.3 mmol/L (ref 3.5–5.1)
SODIUM: 140 mmol/L (ref 135–145)

## 2016-03-10 LAB — ECHOCARDIOGRAM COMPLETE
FS: 21 % — AB (ref 28–44)
HEIGHTINCHES: 65 in
IVS/LV PW RATIO, ED: 1
LA diam end sys: 40 mm
LA diam index: 2.13 cm/m2
LA vol A4C: 40.5 ml
LA vol index: 31.1 mL/m2
LASIZE: 40 mm
LAVOL: 58.5 mL
PW: 12.9 mm — AB (ref 0.6–1.1)
Weight: 2832.47 oz

## 2016-03-10 LAB — CBC WITH DIFFERENTIAL/PLATELET
Basophils Absolute: 0 10*3/uL (ref 0.0–0.1)
Basophils Relative: 0 %
EOS ABS: 0 10*3/uL (ref 0.0–0.7)
EOS PCT: 0 %
HCT: 24.3 % — ABNORMAL LOW (ref 36.0–46.0)
HEMOGLOBIN: 7.7 g/dL — AB (ref 12.0–15.0)
LYMPHS ABS: 1.1 10*3/uL (ref 0.7–4.0)
LYMPHS PCT: 7 %
MCH: 26.4 pg (ref 26.0–34.0)
MCHC: 31.7 g/dL (ref 30.0–36.0)
MCV: 83.2 fL (ref 78.0–100.0)
MONOS PCT: 14 %
Monocytes Absolute: 2 10*3/uL — ABNORMAL HIGH (ref 0.1–1.0)
NEUTROS PCT: 79 %
Neutro Abs: 11.6 10*3/uL — ABNORMAL HIGH (ref 1.7–7.7)
Platelets: 155 10*3/uL (ref 150–400)
RBC: 2.92 MIL/uL — ABNORMAL LOW (ref 3.87–5.11)
RDW: 16.7 % — ABNORMAL HIGH (ref 11.5–15.5)
WBC: 14.8 10*3/uL — ABNORMAL HIGH (ref 4.0–10.5)

## 2016-03-10 LAB — CBC
HCT: 25.3 % — ABNORMAL LOW (ref 36.0–46.0)
Hemoglobin: 8 g/dL — ABNORMAL LOW (ref 12.0–15.0)
MCH: 26.5 pg (ref 26.0–34.0)
MCHC: 31.6 g/dL (ref 30.0–36.0)
MCV: 83.8 fL (ref 78.0–100.0)
Platelets: 172 10*3/uL (ref 150–400)
RBC: 3.02 MIL/uL — ABNORMAL LOW (ref 3.87–5.11)
RDW: 17.2 % — AB (ref 11.5–15.5)
WBC: 17.6 10*3/uL — ABNORMAL HIGH (ref 4.0–10.5)

## 2016-03-10 LAB — BLOOD GAS, ARTERIAL
Acid-base deficit: 2.8 mmol/L — ABNORMAL HIGH (ref 0.0–2.0)
Bicarbonate: 21 mmol/L (ref 20.0–28.0)
Drawn by: 46203
FIO2: 50
O2 SAT: 96.1 %
PCO2 ART: 33.7 mmHg (ref 32.0–48.0)
PH ART: 7.412 (ref 7.350–7.450)
PO2 ART: 79.2 mmHg — AB (ref 83.0–108.0)
Patient temperature: 98.6

## 2016-03-10 LAB — APTT: APTT: 35 s (ref 24–36)

## 2016-03-10 LAB — PREPARE RBC (CROSSMATCH)

## 2016-03-10 MED ORDER — PERFLUTREN LIPID MICROSPHERE
1.0000 mL | INTRAVENOUS | Status: AC | PRN
  Administered 2016-03-10: 2 mL via INTRAVENOUS
  Filled 2016-03-10: qty 10

## 2016-03-10 MED ORDER — PROPOFOL 10 MG/ML IV BOLUS
INTRAVENOUS | Status: AC
  Filled 2016-03-10: qty 20

## 2016-03-10 MED ORDER — LIDOCAINE 2% (20 MG/ML) 5 ML SYRINGE
INTRAMUSCULAR | Status: AC
  Filled 2016-03-10: qty 5

## 2016-03-10 MED ORDER — PERFLUTREN LIPID MICROSPHERE
INTRAVENOUS | Status: AC
  Filled 2016-03-10: qty 10

## 2016-03-10 MED ORDER — SODIUM CHLORIDE 0.9 % IV SOLN
Freq: Once | INTRAVENOUS | Status: AC
  Administered 2016-03-10: 10 mL/h via INTRAVENOUS

## 2016-03-10 MED ORDER — FUROSEMIDE 10 MG/ML IJ SOLN
20.0000 mg | Freq: Once | INTRAMUSCULAR | Status: AC
  Administered 2016-03-10: 20 mg via INTRAVENOUS
  Filled 2016-03-10: qty 2

## 2016-03-10 MED ORDER — LACTATED RINGERS IV SOLN: INTRAVENOUS | Status: DC

## 2016-03-10 MED ORDER — MIDAZOLAM HCL 2 MG/2ML IJ SOLN
INTRAMUSCULAR | Status: AC
  Filled 2016-03-10: qty 2

## 2016-03-10 MED ORDER — FENTANYL CITRATE (PF) 100 MCG/2ML IJ SOLN
INTRAMUSCULAR | Status: AC
  Filled 2016-03-10: qty 4

## 2016-03-10 MED ORDER — SODIUM CHLORIDE 0.9 % IV BOLUS (SEPSIS)
500.0000 mL | Freq: Once | INTRAVENOUS | Status: AC
  Administered 2016-03-10: 500 mL via INTRAVENOUS

## 2016-03-10 NOTE — Progress Notes (Signed)
Dr. Dwain SarnaWakefield notified of patient's hypotension and tachycardia in the 130's. Attempted to medicate for pain.  Order received to draw CBC earlier, give 500ml NS bolus and to repeat if necessary. Will monitor.
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
## 2016-03-10 NOTE — Progress Notes (Signed)
  Echocardiogram 2D Echocardiogram has been performed.  Saturnino Liew 03/10/2016, 10:23 AM

## 2016-03-10 NOTE — Progress Notes (Signed)
Patient anxious and complaining of pain. Given prn pain medication for breakthrough pain. Patient also hyperventilating and oxygen saturation decreasing. Patient placed back on venturi mask at 12 liters/50%.

## 2016-03-10 NOTE — Progress Notes (Signed)
   Subjective:    Recheck right femur fracture  Still awaiting echo and results to decide if surgery can be completed today C/o continued moderate pain in right lower extremit Knee immobilizer in place Patient reports pain as moderate.  Objective:   VITALS:   Vitals:   03/10/16 0600 03/10/16 0700  BP:    Pulse: (!) 121 (!) 121  Resp: (!) 26 (!) 22  Temp:      Right lower extremity in knee immobilizer  Externally rotated  nv intact distally Mild edema distally  LABS  Recent Labs  03/08/16 1400 03/09/16 0500 03/10/16 0300  HGB 9.3* 8.5* 7.7*  HCT 28.8* 25.9* 24.3*  WBC 14.9* 15.8* 14.8*  PLT 189 166 155     Recent Labs  03/08/16 0500 03/09/16 0500 03/10/16 0300  NA 143 140 140  K 4.3 4.5 4.3  BUN 15 12 10   CREATININE 0.82 0.70 0.61  GLUCOSE 163* 125* 130*     Assessment/Plan:   right femur fracture in multiple places Will await echo results before proceeding with surgery Keep NPO Pain management     Alphonsa OverallBrad Albi Rappaport, MPAS, PA-C  03/10/2016, 9:09 AM
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
## 2016-03-10 NOTE — Progress Notes (Signed)
Dr. Dwain SarnaWakefield notified of continued increased work of breathing by patient along with continued tachycardia. Hypotension is resolved somewhat.  Order received for blood gas and lasix. Will monitor.

## 2016-03-11 ENCOUNTER — Inpatient Hospital Stay (HOSPITAL_COMMUNITY): Payer: Medicare Other

## 2016-03-11 ENCOUNTER — Inpatient Hospital Stay (HOSPITAL_COMMUNITY): Payer: Medicare Other | Admitting: Anesthesiology

## 2016-03-11 ENCOUNTER — Encounter (HOSPITAL_COMMUNITY): Payer: Self-pay | Admitting: Certified Registered"

## 2016-03-11 ENCOUNTER — Encounter (HOSPITAL_COMMUNITY): Admission: EM | Disposition: E | Payer: Self-pay | Source: Home / Self Care

## 2016-03-11 DIAGNOSIS — S2249XA Multiple fractures of ribs, unspecified side, initial encounter for closed fracture: Secondary | ICD-10-CM | POA: Diagnosis present

## 2016-03-11 HISTORY — PX: INTRAMEDULLARY (IM) NAIL INTERTROCHANTERIC: SHX5875

## 2016-03-11 LAB — BLOOD GAS, ARTERIAL
ACID-BASE DEFICIT: 5 mmol/L — AB (ref 0.0–2.0)
Acid-base deficit: 2.9 mmol/L — ABNORMAL HIGH (ref 0.0–2.0)
BICARBONATE: 22.1 mmol/L (ref 20.0–28.0)
Bicarbonate: 22.2 mmol/L (ref 20.0–28.0)
DRAWN BY: 46203
Drawn by: 46203
FIO2: 60
FIO2: 70
LHR: 16 {breaths}/min
MECHVT: 500 mL
O2 SAT: 98.8 %
O2 Saturation: 95 %
PEEP/CPAP: 5 cmH2O
PEEP: 5 cmH2O
PH ART: 7.328 — AB (ref 7.350–7.450)
PO2 ART: 92.8 mmHg (ref 83.0–108.0)
Patient temperature: 98.6
Patient temperature: 98.6
RATE: 12 resp/min
VT: 500 mL
pCO2 arterial: 62 mmHg — ABNORMAL HIGH (ref 32.0–48.0)
pCO2 arterial: 43.3 mmHg (ref 32.0–48.0)
pH, Arterial: 7.18 — CL (ref 7.350–7.450)
pO2, Arterial: 151 mmHg — ABNORMAL HIGH (ref 83.0–108.0)

## 2016-03-11 LAB — TYPE AND SCREEN
ABO/RH(D): A POS
ANTIBODY SCREEN: NEGATIVE
UNIT DIVISION: 0
UNIT DIVISION: 0
UNIT DIVISION: 0
UNIT DIVISION: 0
Unit division: 0
Unit division: 0

## 2016-03-11 LAB — TRIGLYCERIDES: TRIGLYCERIDES: 154 mg/dL — AB (ref <150)

## 2016-03-11 LAB — CBC WITH DIFFERENTIAL/PLATELET
BASOS PCT: 0 %
Basophils Absolute: 0 10*3/uL (ref 0.0–0.1)
EOS PCT: 0 %
Eosinophils Absolute: 0 10*3/uL (ref 0.0–0.7)
HCT: 35.1 % — ABNORMAL LOW (ref 36.0–46.0)
Hemoglobin: 11.6 g/dL — ABNORMAL LOW (ref 12.0–15.0)
LYMPHS ABS: 1.5 10*3/uL (ref 0.7–4.0)
Lymphocytes Relative: 9 %
MCH: 27.2 pg (ref 26.0–34.0)
MCHC: 33 g/dL (ref 30.0–36.0)
MCV: 82.4 fL (ref 78.0–100.0)
MONO ABS: 2.9 10*3/uL — AB (ref 0.1–1.0)
Monocytes Relative: 18 %
Neutro Abs: 11.9 10*3/uL — ABNORMAL HIGH (ref 1.7–7.7)
Neutrophils Relative %: 73 %
PLATELETS: 164 10*3/uL (ref 150–400)
RBC: 4.26 MIL/uL (ref 3.87–5.11)
RDW: 16.6 % — AB (ref 11.5–15.5)
WBC: 16.3 10*3/uL — ABNORMAL HIGH (ref 4.0–10.5)

## 2016-03-11 LAB — BASIC METABOLIC PANEL
Anion gap: 7 (ref 5–15)
BUN: 16 mg/dL (ref 6–20)
CO2: 24 mmol/L (ref 22–32)
Calcium: 8.4 mg/dL — ABNORMAL LOW (ref 8.9–10.3)
Chloride: 111 mmol/L (ref 101–111)
Creatinine, Ser: 0.67 mg/dL (ref 0.44–1.00)
GFR calc Af Amer: >60 mL/min (ref >60)
GLUCOSE: 120 mg/dL — AB (ref 65–99)
POTASSIUM: 3.8 mmol/L (ref 3.5–5.1)
Sodium: 142 mmol/L (ref 135–145)

## 2016-03-11 LAB — HEMOGLOBIN AND HEMATOCRIT, BLOOD
HCT: 36.9 % (ref 36.0–46.0)
HEMOGLOBIN: 12.3 g/dL (ref 12.0–15.0)

## 2016-03-11 SURGERY — FIXATION, FRACTURE, INTERTROCHANTERIC, WITH INTRAMEDULLARY ROD
Anesthesia: General | Laterality: Right

## 2016-03-11 MED ORDER — ROCURONIUM BROMIDE 100 MG/10ML IV SOLN
INTRAVENOUS | Status: DC | PRN
  Administered 2016-03-11 (×2): 50 mg via INTRAVENOUS

## 2016-03-11 MED ORDER — FENTANYL CITRATE (PF) 100 MCG/2ML IJ SOLN
INTRAMUSCULAR | Status: DC | PRN
  Administered 2016-03-11: 50 ug via INTRAVENOUS
  Administered 2016-03-11: 100 ug via INTRAVENOUS
  Administered 2016-03-11 (×3): 50 ug via INTRAVENOUS

## 2016-03-11 MED ORDER — CHLORHEXIDINE GLUCONATE 4 % EX LIQD
60.0000 mL | Freq: Once | CUTANEOUS | Status: DC
  Filled 2016-03-11: qty 60

## 2016-03-11 MED ORDER — LACTATED RINGERS IV SOLN
INTRAVENOUS | Status: DC
  Administered 2016-03-11: 14:00:00 via INTRAVENOUS

## 2016-03-11 MED ORDER — FENTANYL CITRATE (PF) 100 MCG/2ML IJ SOLN
INTRAMUSCULAR | Status: AC
  Filled 2016-03-11: qty 2

## 2016-03-11 MED ORDER — PROPOFOL 500 MG/50ML IV EMUL
INTRAVENOUS | Status: DC | PRN
  Administered 2016-03-11: 50 ug/kg/min via INTRAVENOUS

## 2016-03-11 MED ORDER — MIDAZOLAM HCL 2 MG/2ML IJ SOLN
INTRAMUSCULAR | Status: AC
  Filled 2016-03-11: qty 2

## 2016-03-11 MED ORDER — ETOMIDATE 2 MG/ML IV SOLN
INTRAVENOUS | Status: DC | PRN
  Administered 2016-03-11: 16 mg via INTRAVENOUS

## 2016-03-11 MED ORDER — ENOXAPARIN SODIUM 30 MG/0.3ML ~~LOC~~ SOLN: 30.0000 mg | Freq: Two times a day (BID) | SUBCUTANEOUS | Status: DC

## 2016-03-11 MED ORDER — ORAL CARE MOUTH RINSE
15.0000 mL | OROMUCOSAL | Status: DC
  Administered 2016-03-11 – 2016-04-03 (×217): 15 mL via OROMUCOSAL

## 2016-03-11 MED ORDER — DEXTROSE 5 % IV SOLN
INTRAVENOUS | Status: DC | PRN
  Administered 2016-03-11: 30 ug/min via INTRAVENOUS

## 2016-03-11 MED ORDER — SUGAMMADEX SODIUM 200 MG/2ML IV SOLN
INTRAVENOUS | Status: AC
  Filled 2016-03-11: qty 2

## 2016-03-11 MED ORDER — SODIUM CHLORIDE 0.9% FLUSH
10.0000 mL | INTRAVENOUS | Status: DC | PRN
Start: 2016-03-11 — End: 2016-04-03

## 2016-03-11 MED ORDER — PHENYLEPHRINE HCL 10 MG/ML IJ SOLN
0.0000 ug/min | INTRAMUSCULAR | Status: DC
  Administered 2016-03-11: 80 ug/min via INTRAVENOUS
  Filled 2016-03-11 (×2): qty 2

## 2016-03-11 MED ORDER — ORAL CARE MOUTH RINSE
15.0000 mL | Freq: Two times a day (BID) | OROMUCOSAL | Status: DC
  Administered 2016-03-11: 15 mL via OROMUCOSAL

## 2016-03-11 MED ORDER — PROPOFOL 1000 MG/100ML IV EMUL
5.0000 ug/kg/min | INTRAVENOUS | Status: DC
  Administered 2016-03-11 – 2016-03-12 (×2): 30 ug/kg/min via INTRAVENOUS
  Administered 2016-03-12: 35 ug/kg/min via INTRAVENOUS
  Administered 2016-03-12 – 2016-03-13 (×4): 25 ug/kg/min via INTRAVENOUS
  Administered 2016-03-14: 15 ug/kg/min via INTRAVENOUS
  Administered 2016-03-14 (×2): 25 ug/kg/min via INTRAVENOUS
  Filled 2016-03-11 (×11): qty 100

## 2016-03-11 MED ORDER — KCL IN DEXTROSE-NACL 20-5-0.9 MEQ/L-%-% IV SOLN
INTRAVENOUS | Status: DC
  Administered 2016-03-11: 19:00:00 via INTRAVENOUS
  Filled 2016-03-11 (×3): qty 1000

## 2016-03-11 MED ORDER — CHLORHEXIDINE GLUCONATE 0.12% ORAL RINSE (MEDLINE KIT)
15.0000 mL | Freq: Two times a day (BID) | OROMUCOSAL | Status: DC
  Administered 2016-03-11 – 2016-04-03 (×46): 15 mL via OROMUCOSAL

## 2016-03-11 MED ORDER — POVIDONE-IODINE 10 % EX SWAB: 2.0000 "application " | Freq: Once | CUTANEOUS | Status: DC

## 2016-03-11 MED ORDER — CHLORHEXIDINE GLUCONATE 0.12 % MT SOLN
15.0000 mL | Freq: Two times a day (BID) | OROMUCOSAL | Status: DC
  Administered 2016-03-11: 15 mL via OROMUCOSAL

## 2016-03-11 MED ORDER — SODIUM CHLORIDE 0.9% FLUSH
10.0000 mL | Freq: Two times a day (BID) | INTRAVENOUS | Status: DC
  Administered 2016-03-11 – 2016-03-14 (×7): 10 mL
  Administered 2016-03-15: 20 mL
  Administered 2016-03-15 – 2016-03-16 (×2): 10 mL
  Administered 2016-03-16: 20 mL
  Administered 2016-03-17: 10 mL
  Administered 2016-03-17: 20 mL
  Administered 2016-03-18 – 2016-03-21 (×7): 10 mL
  Administered 2016-03-21: 20 mL
  Administered 2016-03-22 – 2016-03-26 (×8): 10 mL
  Administered 2016-03-26: 20 mL
  Administered 2016-03-27 (×2): 10 mL
  Administered 2016-03-28 (×2): 20 mL
  Administered 2016-03-29 – 2016-03-30 (×3): 10 mL
  Administered 2016-03-30: 20 mL
  Administered 2016-03-31 – 2016-04-03 (×6): 10 mL

## 2016-03-11 MED ORDER — FUROSEMIDE 10 MG/ML IJ SOLN
10.0000 mg | Freq: Once | INTRAMUSCULAR | Status: AC
  Administered 2016-03-11: 10 mg via INTRAVENOUS
  Filled 2016-03-11: qty 2

## 2016-03-11 MED ORDER — LACTATED RINGERS IV SOLN
INTRAVENOUS | Status: DC | PRN
  Administered 2016-03-11: 15:00:00 via INTRAVENOUS

## 2016-03-11 MED ORDER — SUCCINYLCHOLINE CHLORIDE 20 MG/ML IJ SOLN
INTRAMUSCULAR | Status: DC | PRN
  Administered 2016-03-11: 120 mg via INTRAVENOUS

## 2016-03-11 MED ORDER — ONDANSETRON HCL 4 MG/2ML IJ SOLN
INTRAMUSCULAR | Status: AC
  Filled 2016-03-11: qty 2

## 2016-03-11 MED ORDER — CEFAZOLIN SODIUM-DEXTROSE 2-4 GM/100ML-% IV SOLN
2.0000 g | INTRAVENOUS | Status: AC
  Administered 2016-03-11: 2 g via INTRAVENOUS
  Filled 2016-03-11 (×2): qty 100

## 2016-03-11 MED ORDER — ENOXAPARIN SODIUM 40 MG/0.4ML ~~LOC~~ SOLN
40.0000 mg | SUBCUTANEOUS | Status: DC
  Administered 2016-03-11: 40 mg via SUBCUTANEOUS
  Filled 2016-03-11: qty 0.4

## 2016-03-11 MED ORDER — LORAZEPAM 2 MG/ML IJ SOLN
0.5000 mg | INTRAMUSCULAR | Status: DC | PRN
  Administered 2016-03-11 (×2): 0.5 mg via INTRAVENOUS
  Administered 2016-04-01: 1 mg via INTRAVENOUS
  Filled 2016-03-11 (×3): qty 1

## 2016-03-11 MED ORDER — ALBUMIN HUMAN 5 % IV SOLN
INTRAVENOUS | Status: DC | PRN
  Administered 2016-03-11: 16:00:00 via INTRAVENOUS

## 2016-03-11 MED ORDER — 0.9 % SODIUM CHLORIDE (POUR BTL) OPTIME
TOPICAL | Status: DC | PRN
  Administered 2016-03-11: 1000 mL

## 2016-03-11 MED ORDER — ROCURONIUM BROMIDE 10 MG/ML (PF) SYRINGE
PREFILLED_SYRINGE | INTRAVENOUS | Status: AC
  Filled 2016-03-11: qty 10

## 2016-03-11 MED ORDER — CEFAZOLIN SODIUM-DEXTROSE 2-4 GM/100ML-% IV SOLN
2.0000 g | Freq: Three times a day (TID) | INTRAVENOUS | Status: AC
  Administered 2016-03-11 – 2016-03-12 (×3): 2 g via INTRAVENOUS
  Filled 2016-03-11 (×3): qty 100

## 2016-03-11 SURGICAL SUPPLY — 46 items
BIT DRILL CALIBRATED 4.2 (BIT) ×1 IMPLANT
BLADE SURG 15 STRL LF DISP TIS (BLADE) ×1 IMPLANT
BLADE SURG 15 STRL SS (BLADE) ×2
BNDG COHESIVE 4X5 TAN NS LF (GAUZE/BANDAGES/DRESSINGS) IMPLANT
BNDG COHESIVE 6X5 TAN STRL LF (GAUZE/BANDAGES/DRESSINGS) ×3 IMPLANT
BNDG GAUZE ELAST 4 BULKY (GAUZE/BANDAGES/DRESSINGS) IMPLANT
COVER PERINEAL POST (MISCELLANEOUS) ×3 IMPLANT
COVER SURGICAL LIGHT HANDLE (MISCELLANEOUS) ×3 IMPLANT
DRAPE PROXIMA HALF (DRAPES) ×3 IMPLANT
DRAPE STERI IOBAN 125X83 (DRAPES) ×3 IMPLANT
DRILL BIT CALIBRATED 4.2 (BIT) ×3
DRSG MEPILEX BORDER 4X4 (GAUZE/BANDAGES/DRESSINGS) ×3 IMPLANT
DRSG MEPILEX BORDER 4X8 (GAUZE/BANDAGES/DRESSINGS) ×3 IMPLANT
DRSG PAD ABDOMINAL 8X10 ST (GAUZE/BANDAGES/DRESSINGS) IMPLANT
DURAPREP 26ML APPLICATOR (WOUND CARE) ×3 IMPLANT
ELECT CAUTERY BLADE 6.4 (BLADE) ×3 IMPLANT
ELECT REM PT RETURN 9FT ADLT (ELECTROSURGICAL) ×3
ELECTRODE REM PT RTRN 9FT ADLT (ELECTROSURGICAL) ×1 IMPLANT
FACESHIELD WRAPAROUND (MASK) IMPLANT
GAUZE XEROFORM 5X9 LF (GAUZE/BANDAGES/DRESSINGS) ×3 IMPLANT
GLOVE BIO SURGEON STRL SZ7.5 (GLOVE) ×6 IMPLANT
GLOVE BIOGEL PI IND STRL 8 (GLOVE) ×1 IMPLANT
GLOVE BIOGEL PI INDICATOR 8 (GLOVE) ×2
GOWN STRL REUS W/ TWL LRG LVL3 (GOWN DISPOSABLE) ×2 IMPLANT
GOWN STRL REUS W/ TWL XL LVL3 (GOWN DISPOSABLE) ×1 IMPLANT
GOWN STRL REUS W/TWL LRG LVL3 (GOWN DISPOSABLE) ×4
GOWN STRL REUS W/TWL XL LVL3 (GOWN DISPOSABLE) ×2
GUIDEWIRE 3.2X400 (WIRE) ×6 IMPLANT
KIT BASIN OR (CUSTOM PROCEDURE TRAY) ×3 IMPLANT
KIT ROOM TURNOVER OR (KITS) ×3 IMPLANT
LINER BOOT UNIVERSAL DISP (MISCELLANEOUS) IMPLANT
MANIFOLD NEPTUNE II (INSTRUMENTS) ×3 IMPLANT
NAIL TROCH FIX 11X235 RT 130 (Nail) ×3 IMPLANT
NS IRRIG 1000ML POUR BTL (IV SOLUTION) ×3 IMPLANT
PACK GENERAL/GYN (CUSTOM PROCEDURE TRAY) ×3 IMPLANT
PAD ARMBOARD 7.5X6 YLW CONV (MISCELLANEOUS) ×6 IMPLANT
PAD CAST 4YDX4 CTTN HI CHSV (CAST SUPPLIES) IMPLANT
PADDING CAST COTTON 4X4 STRL (CAST SUPPLIES)
SCREW FEMORAL CFNA 10.35X100MM (Screw) ×3 IMPLANT
SCREW LOCKING 5.0X34MM (Screw) ×3 IMPLANT
SPONGE GAUZE 4X4 12PLY STER LF (GAUZE/BANDAGES/DRESSINGS) ×3 IMPLANT
STAPLER VISISTAT 35W (STAPLE) IMPLANT
SUT MON AB 2-0 CT1 36 (SUTURE) ×3 IMPLANT
TOWEL OR 17X24 6PK STRL BLUE (TOWEL DISPOSABLE) ×6 IMPLANT
TOWEL OR 17X26 10 PK STRL BLUE (TOWEL DISPOSABLE) ×3 IMPLANT
WATER STERILE IRR 1000ML POUR (IV SOLUTION) IMPLANT

## 2016-03-11 NOTE — Brief Op Note (Signed)
2016-03-21 - 02/21/2016  5:31 PM  PATIENT:  [name]  65 y.o. female  PRE-OPERATIVE DIAGNOSIS:  RIGHT HIP FRACTURE  POST-OPERATIVE DIAGNOSIS:  RIGHT HIP FRACTURE  PROCEDURE:  Procedure(s): INTRAMEDULLARY (IM) NAIL INTERTROCHANTRIC (Right)  SURGEON:  Surgeon(s) and Role:    * Yolonda KidaJason Patrick Rogers, MD - Primary  PHYSICIAN ASSISTANT:   ASSISTANTS: none   ANESTHESIA:  general  EBL:  Total I/O In: 560 [I.V.:510; IV Piggyback:50] Out: 1035 [Urine:835; Blood:200]  BLOOD ADMINISTERED:none  DRAINS: none   LOCAL MEDICATIONS USED:  NONE  SPECIMEN:  No Specimen  DISPOSITION OF SPECIMEN:  N/A  COUNTS:  YES  TOURNIQUET:  * No tourniquets in log *  DICTATION: .Note written in EPIC  PLAN OF CARE: Admit to inpatient   PATIENT DISPOSITION:  ICU - intubated and hemodynamically stable.   Delay start of Pharmacological VTE agent (>24hrs) due to surgical blood loss or risk of bleeding: no

## 2016-03-11 NOTE — Progress Notes (Signed)
Per ortho MD, please do not start Lovenox until after surgery today

## 2016-03-11 NOTE — Anesthesia Procedure Notes (Signed)
Procedure Name: Intubation Date/Time: 03/05/2016 3:14 PM Performed by: Rosiland OzMEYERS, Aisa Schoeppner Pre-anesthesia Checklist: Patient identified, Emergency Drugs available, Suction available, Timeout performed and Patient being monitored Patient Re-evaluated:Patient Re-evaluated prior to inductionOxygen Delivery Method: Circle system utilized Preoxygenation: Pre-oxygenation with 100% oxygen Intubation Type: IV induction Laryngoscope Size: Glidescope and 3 Grade View: Grade I Tube type: Subglottic suction tube Tube size: 7.5 mm Number of attempts: 1 Airway Equipment and Method: Stylet and Video-laryngoscopy Placement Confirmation: ETT inserted through vocal cords under direct vision,  positive ETCO2 and breath sounds checked- equal and bilateral Secured at: 21 cm Tube secured with: Tape Dental Injury: Teeth and Oropharynx as per pre-operative assessment

## 2016-03-11 NOTE — Care Management Important Message (Signed)
Important Message  Patient Details  Name: [name] MRN: [id number] Date of Birth: 05-21-1950   Medicare Important Message Given:  Yes    Jayleana Colberg Stefan ChurchBratton 03/10/2016, 3:57 PM
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
## 2016-03-11 NOTE — Transfer of Care (Signed)
Immediate Anesthesia Transfer of Care Note  Patient: [name]  Procedure(s) Performed: Procedure(s): INTRAMEDULLARY (IM) NAIL INTERTROCHANTRIC (Right)  Patient Location: NICU  Anesthesia Type:General  Level of Consciousness: sedated, remains intubated per anesthesia plan  Airway & Oxygen Therapy: Patient remains intubated per anesthesia plan and Patient placed on Ventilator (see vital sign flow sheet for setting)  Post-op Assessment: Report given to RN and Post -op Vital signs reviewed and stable  Post vital signs: Reviewed and stable  Last Vitals:  Vitals:   03/10/2016 1340 02/29/2016 1348  BP:    Pulse:    Resp: (!) 29 (!) 25  Temp:      Last Pain:  Vitals:   03/04/2016 1200  TempSrc: Axillary  PainSc:          Complications: No apparent anesthesia complications
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
## 2016-03-11 NOTE — Anesthesia Postprocedure Evaluation (Signed)
Anesthesia Post Note  Patient: [name]  Procedure(s) Performed: Procedure(s) (LRB): INTRAMEDULLARY (IM) NAIL INTERTROCHANTRIC (Right)  Patient location during evaluation: ICU Anesthesia Type: General Level of consciousness: sedated and patient remains intubated per anesthesia plan Pain management: pain level controlled Vital Signs Assessment: post-procedure vital signs reviewed and stable Respiratory status: patient on ventilator - see flowsheet for VS and patient remains intubated per anesthesia plan Cardiovascular status: stable Anesthetic complications: no    Last Vitals:  Vitals:   20-Aug-2015 1340 20-Aug-2015 1348  BP:    Pulse:    Resp: (!) 29 (!) 25  Temp:      Last Pain:  Vitals:   20-Aug-2015 1200  TempSrc: Axillary  PainSc:                  Jamaal Bernasconi,E. Fraya Ueda
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
## 2016-03-11 NOTE — H&P (Signed)
H&P update  The surgical history has been reviewed and remains accurate without interval change.  The patient was re-examined and patient's physiologic condition has not changed significantly in the last 30 days. The condition still exists that makes this procedure necessary. The treatment plan remains the same, without new options for care.  No new pharmacological allergies or types of therapy has been initiated that would change the plan or the appropriateness of the plan.  The patient and/or family understand the potential benefits and risks.  Jemarion Roycroft P. Aundria Rudogers, MD 03/12/2016 2:03 PM

## 2016-03-12 ENCOUNTER — Encounter (HOSPITAL_COMMUNITY): Payer: Self-pay | Admitting: Orthopedic Surgery

## 2016-03-12 DIAGNOSIS — S72401A Unspecified fracture of lower end of right femur, initial encounter for closed fracture: Secondary | ICD-10-CM

## 2016-03-12 LAB — POCT I-STAT 7, (LYTES, BLD GAS, ICA,H+H)
Acid-Base Excess: 1 mmol/L (ref 0.0–2.0)
Bicarbonate: 23.6 mmol/L (ref 20.0–28.0)
CALCIUM ION: 1.16 mmol/L (ref 1.15–1.40)
HEMATOCRIT: 33 % — AB (ref 36.0–46.0)
Hemoglobin: 11.2 g/dL — ABNORMAL LOW (ref 12.0–15.0)
O2 SAT: 97 %
PH ART: 7.483 — AB (ref 7.350–7.450)
Potassium: 3.6 mmol/L (ref 3.5–5.1)
SODIUM: 145 mmol/L (ref 135–145)
TCO2: 25 mmol/L (ref 0–100)
pCO2 arterial: 31.5 mmHg — ABNORMAL LOW (ref 32.0–48.0)
pO2, Arterial: 79 mmHg — ABNORMAL LOW (ref 83.0–108.0)

## 2016-03-12 LAB — COMPREHENSIVE METABOLIC PANEL
ALBUMIN: 2.2 g/dL — AB (ref 3.5–5.0)
ALK PHOS: 54 U/L (ref 38–126)
ALT: 43 U/L (ref 14–54)
ANION GAP: 6 (ref 5–15)
AST: 59 U/L — AB (ref 15–41)
BILIRUBIN TOTAL: 1.2 mg/dL (ref 0.3–1.2)
BUN: 19 mg/dL (ref 6–20)
CO2: 24 mmol/L (ref 22–32)
Calcium: 7.6 mg/dL — ABNORMAL LOW (ref 8.9–10.3)
Chloride: 110 mmol/L (ref 101–111)
Creatinine, Ser: 0.61 mg/dL (ref 0.44–1.00)
GFR calc Af Amer: >60 mL/min (ref >60)
GFR calc non Af Amer: >60 mL/min (ref >60)
GLUCOSE: 171 mg/dL — AB (ref 65–99)
POTASSIUM: 3.6 mmol/L (ref 3.5–5.1)
SODIUM: 140 mmol/L (ref 135–145)
TOTAL PROTEIN: 4.6 g/dL — AB (ref 6.5–8.1)

## 2016-03-12 LAB — TROPONIN I
TROPONIN I: 5.3 ng/mL — AB (ref <0.03)
Troponin I: 7.7 ng/mL (ref <0.03)
Troponin I: 3.62 ng/mL (ref <0.03)

## 2016-03-12 LAB — CBC
HEMATOCRIT: 30.7 % — AB (ref 36.0–46.0)
HEMOGLOBIN: 10 g/dL — AB (ref 12.0–15.0)
MCH: 27.8 pg (ref 26.0–34.0)
MCHC: 32.6 g/dL (ref 30.0–36.0)
MCV: 85.3 fL (ref 78.0–100.0)
Platelets: 151 10*3/uL (ref 150–400)
RBC: 3.6 MIL/uL — ABNORMAL LOW (ref 3.87–5.11)
RDW: 18.1 % — AB (ref 11.5–15.5)
WBC: 11.8 10*3/uL — AB (ref 4.0–10.5)

## 2016-03-12 LAB — BRAIN NATRIURETIC PEPTIDE: B Natriuretic Peptide: 1384.9 pg/mL — ABNORMAL HIGH (ref 0.0–100.0)

## 2016-03-12 MED ORDER — NOREPINEPHRINE BITARTRATE 1 MG/ML IV SOLN
2.0000 ug/min | INTRAVENOUS | Status: DC
  Filled 2016-03-12: qty 4

## 2016-03-12 MED ORDER — KCL IN DEXTROSE-NACL 20-5-0.9 MEQ/L-%-% IV SOLN
INTRAVENOUS | Status: DC
  Administered 2016-03-12 – 2016-03-18 (×10): via INTRAVENOUS
  Filled 2016-03-12 (×18): qty 1000

## 2016-03-12 MED ORDER — KCL IN DEXTROSE-NACL 20-5-0.9 MEQ/L-%-% IV SOLN
INTRAVENOUS | Status: DC
  Filled 2016-03-12: qty 1000

## 2016-03-12 MED ORDER — INSULIN ASPART 100 UNIT/ML ~~LOC~~ SOLN: 0.0000 [IU] | SUBCUTANEOUS | Status: DC

## 2016-03-12 MED ORDER — ALBUMIN HUMAN 5 % IV SOLN
25.0000 g | Freq: Once | INTRAVENOUS | Status: AC
  Administered 2016-03-12: 25 g via INTRAVENOUS
  Filled 2016-03-12: qty 250

## 2016-03-12 MED ORDER — FAMOTIDINE IN NACL 20-0.9 MG/50ML-% IV SOLN
20.0000 mg | Freq: Two times a day (BID) | INTRAVENOUS | Status: DC
  Administered 2016-03-12 – 2016-03-14 (×5): 20 mg via INTRAVENOUS
  Filled 2016-03-12 (×5): qty 50

## 2016-03-12 MED ORDER — ALBUMIN HUMAN 5 % IV SOLN
INTRAVENOUS | Status: AC
  Filled 2016-03-12: qty 250

## 2016-03-12 MED ORDER — POTASSIUM CHLORIDE 10 MEQ/100ML IV SOLN
10.0000 meq | INTRAVENOUS | Status: AC
  Administered 2016-03-12 (×2): 10 meq via INTRAVENOUS
  Filled 2016-03-12 (×2): qty 100

## 2016-03-12 MED ORDER — CHLORHEXIDINE GLUCONATE 0.12 % MT SOLN
OROMUCOSAL | Status: AC
  Filled 2016-03-12: qty 15

## 2016-03-12 MED ORDER — TRACE MINERALS CR-CU-MN-SE-ZN 10-1000-500-60 MCG/ML IV SOLN
INTRAVENOUS | Status: DC
  Filled 2016-03-12: qty 960

## 2016-03-12 NOTE — H&P (Signed)
H&P update  The surgical history has been reviewed and remains accurate without interval change.  The patient was re-examined and patient's physiologic condition has not changed significantly in the last 30 days. The condition still exists that makes this procedure necessary. The treatment plan remains the same, without new options for care.  No new pharmacological allergies or types of therapy has been initiated that would change the plan or the appropriateness of the plan.  The patient and/or family understand the potential benefits and risks.  N. Michael Xu, MD 03/12/2016 5:18 PM   
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
## 2016-03-12 NOTE — Progress Notes (Signed)
Patient ID: [name], female   DOB: 03-19-1951, 65 y.o.   MRN: [id number] Notified of critical TNA shortage. Will cancel this for now and re-eval over the next day or two. Violeta GelinasBurke Cristiano Capri, MD, MPH, FACS Trauma: [phone] General Surgery: [phone]
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
## 2016-03-13 ENCOUNTER — Encounter (HOSPITAL_COMMUNITY): Payer: Self-pay | Admitting: Certified Registered"

## 2016-03-13 ENCOUNTER — Encounter (HOSPITAL_COMMUNITY): Admission: EM | Disposition: E | Payer: Self-pay | Source: Home / Self Care

## 2016-03-13 ENCOUNTER — Inpatient Hospital Stay (HOSPITAL_COMMUNITY): Payer: Medicare Other

## 2016-03-13 ENCOUNTER — Inpatient Hospital Stay (HOSPITAL_COMMUNITY): Payer: Medicare Other | Admitting: Anesthesiology

## 2016-03-13 DIAGNOSIS — S72461A Displaced supracondylar fracture with intracondylar extension of lower end of right femur, initial encounter for closed fracture: Secondary | ICD-10-CM

## 2016-03-13 DIAGNOSIS — S72491A Other fracture of lower end of right femur, initial encounter for closed fracture: Secondary | ICD-10-CM

## 2016-03-13 HISTORY — PX: ORIF FEMUR FRACTURE: SHX2119

## 2016-03-13 LAB — BASIC METABOLIC PANEL
ANION GAP: 7 (ref 5–15)
BUN: 18 mg/dL (ref 6–20)
CHLORIDE: 113 mmol/L — AB (ref 101–111)
CO2: 25 mmol/L (ref 22–32)
Calcium: 8 mg/dL — ABNORMAL LOW (ref 8.9–10.3)
Creatinine, Ser: 0.55 mg/dL (ref 0.44–1.00)
GFR calc Af Amer: >60 mL/min (ref >60)
GLUCOSE: 159 mg/dL — AB (ref 65–99)
POTASSIUM: 4.1 mmol/L (ref 3.5–5.1)
Sodium: 145 mmol/L (ref 135–145)

## 2016-03-13 LAB — CBC
HEMATOCRIT: 28.4 % — AB (ref 36.0–46.0)
Hemoglobin: 9 g/dL — ABNORMAL LOW (ref 12.0–15.0)
MCH: 27.4 pg (ref 26.0–34.0)
MCHC: 31.7 g/dL (ref 30.0–36.0)
MCV: 86.6 fL (ref 78.0–100.0)
PLATELETS: 157 10*3/uL (ref 150–400)
RBC: 3.28 MIL/uL — AB (ref 3.87–5.11)
RDW: 18.7 % — ABNORMAL HIGH (ref 11.5–15.5)
WBC: 11.4 10*3/uL — AB (ref 4.0–10.5)

## 2016-03-13 SURGERY — OPEN REDUCTION INTERNAL FIXATION (ORIF) DISTAL FEMUR FRACTURE
Anesthesia: General | Site: Leg Upper | Laterality: Right

## 2016-03-13 MED ORDER — ROCURONIUM BROMIDE 100 MG/10ML IV SOLN
INTRAVENOUS | Status: DC | PRN
  Administered 2016-03-13 (×2): 50 mg via INTRAVENOUS

## 2016-03-13 MED ORDER — PHENYLEPHRINE HCL 10 MG/ML IJ SOLN
INTRAMUSCULAR | Status: DC | PRN
  Administered 2016-03-13: 80 ug via INTRAVENOUS

## 2016-03-13 MED ORDER — PROPOFOL 10 MG/ML IV BOLUS
INTRAVENOUS | Status: AC
Start: 2016-03-13 — End: 2016-03-13
  Filled 2016-03-13: qty 20

## 2016-03-13 MED ORDER — MIDAZOLAM HCL 5 MG/5ML IJ SOLN
INTRAMUSCULAR | Status: DC | PRN
  Administered 2016-03-13: 2 mg via INTRAVENOUS

## 2016-03-13 MED ORDER — PHENYLEPHRINE 40 MCG/ML (10ML) SYRINGE FOR IV PUSH (FOR BLOOD PRESSURE SUPPORT)
PREFILLED_SYRINGE | INTRAVENOUS | Status: AC
  Filled 2016-03-13: qty 10

## 2016-03-13 MED ORDER — FENTANYL CITRATE (PF) 100 MCG/2ML IJ SOLN
INTRAMUSCULAR | Status: AC
  Filled 2016-03-13: qty 2

## 2016-03-13 MED ORDER — CEFAZOLIN SODIUM-DEXTROSE 2-4 GM/100ML-% IV SOLN: 2.0000 g | Freq: Once | INTRAVENOUS | Status: DC

## 2016-03-13 MED ORDER — ROCURONIUM BROMIDE 10 MG/ML (PF) SYRINGE
PREFILLED_SYRINGE | INTRAVENOUS | Status: AC
  Filled 2016-03-13: qty 30

## 2016-03-13 MED ORDER — LIDOCAINE 2% (20 MG/ML) 5 ML SYRINGE
INTRAMUSCULAR | Status: AC
  Filled 2016-03-13: qty 10

## 2016-03-13 MED ORDER — 0.9 % SODIUM CHLORIDE (POUR BTL) OPTIME
TOPICAL | Status: DC | PRN
Start: 2016-03-13 — End: 2016-03-13
  Administered 2016-03-13: 1000 mL

## 2016-03-13 MED ORDER — FENTANYL CITRATE (PF) 100 MCG/2ML IJ SOLN
INTRAMUSCULAR | Status: DC | PRN
  Administered 2016-03-13 (×2): 50 ug via INTRAVENOUS

## 2016-03-13 MED ORDER — MIDAZOLAM HCL 2 MG/2ML IJ SOLN
INTRAMUSCULAR | Status: AC
  Filled 2016-03-13: qty 2

## 2016-03-13 SURGICAL SUPPLY — 57 items
BANDAGE ELASTIC 6 VELCRO ST LF (GAUZE/BANDAGES/DRESSINGS) ×2 IMPLANT
BAR GLASS FIBER EXFX 11X500 (EXFIX) ×4 IMPLANT
BIT DRILL 4.3 (BIT) ×3 IMPLANT
BIT DRILL 4.3X300MM (BIT) ×1 IMPLANT
BIT DRILL CANN MED FLUTE 4.0 (BIT) ×1 IMPLANT
BIT DRILL ORANGE 4.0 LONG (BIT) ×2 IMPLANT
BLADE SURG ROTATE 9660 (MISCELLANEOUS) IMPLANT
BNDG GAUZE ELAST 4 BULKY (GAUZE/BANDAGES/DRESSINGS) ×2 IMPLANT
CAP LOCK NCB (Cap) ×16 IMPLANT
DRAPE C-ARM 42X72 X-RAY (DRAPES) ×2 IMPLANT
DRAPE C-ARMOR (DRAPES) ×2 IMPLANT
DRAPE IMP U-DRAPE 54X76 (DRAPES) ×6 IMPLANT
DRAPE INCISE IOBAN 66X45 STRL (DRAPES) ×2 IMPLANT
DRAPE ORTHO SPLIT 77X108 STRL (DRAPES) ×2
DRAPE SURG ORHT 6 SPLT 77X108 (DRAPES) ×2 IMPLANT
DRAPE U-SHAPE 47X51 STRL (DRAPES) ×4 IMPLANT
DRILL BIT 4.3 (BIT) ×1
DRILL CANN 4.0MM (BIT) ×2
DRSG MEPILEX BORDER 4X8 (GAUZE/BANDAGES/DRESSINGS) ×2 IMPLANT
ELECT REM PT RETURN 9FT ADLT (ELECTROSURGICAL) ×2
ELECTRODE REM PT RTRN 9FT ADLT (ELECTROSURGICAL) ×1 IMPLANT
GAUZE SPONGE 4X4 12PLY STRL (GAUZE/BANDAGES/DRESSINGS) ×2 IMPLANT
GAUZE XEROFORM 5X9 LF (GAUZE/BANDAGES/DRESSINGS) ×4 IMPLANT
GLOVE SKINSENSE NS SZ7.5 (GLOVE) ×2
GLOVE SKINSENSE STRL SZ7.5 (GLOVE) ×2 IMPLANT
GOWN STRL REIN XL XLG (GOWN DISPOSABLE) ×6 IMPLANT
K-WIRE 2.0 (WIRE) ×2
K-WIRE FXSTD 280X2XNS SS (WIRE) ×2
KIT BASIN OR (CUSTOM PROCEDURE TRAY) ×2 IMPLANT
KIT ROOM TURNOVER OR (KITS) ×2 IMPLANT
KWIRE FXSTD 280X2XNS SS (WIRE) ×2 IMPLANT
MANIFOLD NEPTUNE II (INSTRUMENTS) ×2 IMPLANT
NS IRRIG 1000ML POUR BTL (IV SOLUTION) ×2 IMPLANT
PACK GENERAL/GYN (CUSTOM PROCEDURE TRAY) ×2 IMPLANT
PACK UNIVERSAL I (CUSTOM PROCEDURE TRAY) ×2 IMPLANT
PAD ARMBOARD 7.5X6 YLW CONV (MISCELLANEOUS) ×4 IMPLANT
PIN CLAMP 2BAR 75MM BLUE (EXFIX) ×4 IMPLANT
PIN HALF ORANGE 5X200X45MM (EXFIX) ×4 IMPLANT
PIN HALF YELLOW 5X160X35 (EXFIX) ×4 IMPLANT
PLATE NLB PP 9H DIST FEMUR 238 (Plate) ×2 IMPLANT
SCREW 5.0 70MM (Screw) ×2 IMPLANT
SCREW CORTICAL 5.0X10MM (Screw) ×4 IMPLANT
SCREW CORTICAL NCB 5.0X40 (Screw) ×2 IMPLANT
SCREW NCB 3.5X75X5X6.2XST (Screw) ×2 IMPLANT
SCREW NCB 5.0X34MM (Screw) ×2 IMPLANT
SCREW NCB 5.0X36MM (Screw) ×4 IMPLANT
SCREW NCB 5.0X38 (Screw) ×2 IMPLANT
SCREW NCB 5.0X75MM (Screw) ×2 IMPLANT
SCREW UNI 5.0 12MM (Screw) ×2 IMPLANT
STAPLER VISISTAT 35W (STAPLE) ×2 IMPLANT
SUT ETHILON 2 0 FS 18 (SUTURE) IMPLANT
SUT VIC AB 0 CT1 27 (SUTURE) ×1
SUT VIC AB 0 CT1 27XBRD ANBCTR (SUTURE) ×1 IMPLANT
SUT VIC AB 2-0 CT1 36 (SUTURE) ×2 IMPLANT
TOWEL OR 17X24 6PK STRL BLUE (TOWEL DISPOSABLE) ×2 IMPLANT
TOWEL OR 17X26 10 PK STRL BLUE (TOWEL DISPOSABLE) ×4 IMPLANT
WATER STERILE IRR 1000ML POUR (IV SOLUTION) ×4 IMPLANT
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
## 2016-03-13 NOTE — Transfer of Care (Signed)
Immediate Anesthesia Transfer of Care Note  Patient: [name]  Procedure(s) Performed: Procedure(s): EXTERNAL FIXATION RIGHT LEG OPEN REDUCTION INTERNAL FIXATION (ORIF) RIGHT DISTAL FEMUR FRACTURE (Right)  Patient Location: ICU  Anesthesia Type:General  Level of Consciousness: Patient remains intubated per anesthesia plan  Airway & Oxygen Therapy: Patient remains intubated per anesthesia plan and Patient placed on Ventilator (see vital sign flow sheet for setting)  Post-op Assessment: Report given to RN and Post -op Vital signs reviewed and stable  Post vital signs: Reviewed and stable  Last Vitals:  Vitals:   16-Sep-2015 1900 16-Sep-2015 1926  BP: 106/75   Pulse: (!) 113 (!) 111  Resp: (!) 27   Temp:      Last Pain:  Vitals:   16-Sep-2015 1545  TempSrc: Axillary  PainSc:          Complications: No apparent anesthesia complications
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
## 2016-03-14 ENCOUNTER — Telehealth (INDEPENDENT_AMBULATORY_CARE_PROVIDER_SITE_OTHER): Payer: Self-pay | Admitting: *Deleted

## 2016-03-14 ENCOUNTER — Inpatient Hospital Stay (HOSPITAL_COMMUNITY): Payer: Medicare Other

## 2016-03-14 LAB — CBC
HCT: 28.3 % — ABNORMAL LOW (ref 36.0–46.0)
HEMOGLOBIN: 8.8 g/dL — AB (ref 12.0–15.0)
MCH: 27.8 pg (ref 26.0–34.0)
MCHC: 31.1 g/dL (ref 30.0–36.0)
MCV: 89.6 fL (ref 78.0–100.0)
Platelets: 176 10*3/uL (ref 150–400)
RBC: 3.16 MIL/uL — AB (ref 3.87–5.11)
RDW: 19.5 % — ABNORMAL HIGH (ref 11.5–15.5)
WBC: 12.8 10*3/uL — AB (ref 4.0–10.5)

## 2016-03-14 LAB — GLUCOSE, CAPILLARY
GLUCOSE-CAPILLARY: 174 mg/dL — AB (ref 65–99)
Glucose-Capillary: 177 mg/dL — ABNORMAL HIGH (ref 65–99)
Glucose-Capillary: 190 mg/dL — ABNORMAL HIGH (ref 65–99)

## 2016-03-14 LAB — BASIC METABOLIC PANEL
ANION GAP: 5 (ref 5–15)
BUN: 21 mg/dL — ABNORMAL HIGH (ref 6–20)
CALCIUM: 8 mg/dL — AB (ref 8.9–10.3)
CHLORIDE: 115 mmol/L — AB (ref 101–111)
CO2: 26 mmol/L (ref 22–32)
CREATININE: 0.54 mg/dL (ref 0.44–1.00)
GFR calc non Af Amer: >60 mL/min (ref >60)
Glucose, Bld: 172 mg/dL — ABNORMAL HIGH (ref 65–99)
Potassium: 4.4 mmol/L (ref 3.5–5.1)
SODIUM: 146 mmol/L — AB (ref 135–145)

## 2016-03-14 LAB — PHOSPHORUS
PHOSPHORUS: 2.3 mg/dL — AB (ref 2.5–4.6)
Phosphorus: 2.8 mg/dL (ref 2.5–4.6)

## 2016-03-14 LAB — TYPE AND SCREEN
ABO/RH(D): A POS
ANTIBODY SCREEN: NEGATIVE
UNIT DIVISION: 0
Unit division: 0

## 2016-03-14 LAB — MAGNESIUM
Magnesium: 1.9 mg/dL (ref 1.7–2.4)
Magnesium: 1.9 mg/dL (ref 1.7–2.4)

## 2016-03-14 LAB — TRIGLYCERIDES: TRIGLYCERIDES: 158 mg/dL — AB (ref <150)

## 2016-03-14 MED ORDER — ADULT MULTIVITAMIN LIQUID CH
15.0000 mL | Freq: Every day | ORAL | Status: DC
  Administered 2016-03-14 – 2016-04-03 (×21): 15 mL
  Filled 2016-03-14 (×21): qty 15

## 2016-03-14 MED ORDER — ENOXAPARIN SODIUM 40 MG/0.4ML ~~LOC~~ SOLN
40.0000 mg | SUBCUTANEOUS | Status: DC
  Administered 2016-03-14 – 2016-03-22 (×9): 40 mg via SUBCUTANEOUS
  Filled 2016-03-14 (×9): qty 0.4

## 2016-03-14 MED ORDER — METOPROLOL TARTRATE 5 MG/5ML IV SOLN
2.5000 mg | Freq: Four times a day (QID) | INTRAVENOUS | Status: DC
  Administered 2016-03-14 – 2016-03-15 (×2): 2.5 mg via INTRAVENOUS
  Filled 2016-03-14 (×2): qty 5

## 2016-03-14 MED ORDER — PRO-STAT SUGAR FREE PO LIQD
60.0000 mL | Freq: Two times a day (BID) | ORAL | Status: DC
  Administered 2016-03-14 – 2016-04-03 (×40): 60 mL
  Filled 2016-03-14 (×40): qty 60

## 2016-03-14 MED ORDER — ALBUMIN HUMAN 5 % IV SOLN
12.5000 g | Freq: Once | INTRAVENOUS | Status: AC
  Administered 2016-03-14: 12.5 g via INTRAVENOUS
  Filled 2016-03-14: qty 250

## 2016-03-14 MED ORDER — CHLORHEXIDINE GLUCONATE 0.12 % MT SOLN
OROMUCOSAL | Status: AC
  Filled 2016-03-14: qty 15

## 2016-03-14 MED ORDER — PIVOT 1.5 CAL PO LIQD
1000.0000 mL | ORAL | Status: DC
  Administered 2016-03-14 – 2016-03-15 (×2): 1000 mL

## 2016-03-14 MED ORDER — PRO-STAT SUGAR FREE PO LIQD
30.0000 mL | Freq: Two times a day (BID) | ORAL | Status: DC
  Administered 2016-03-14: 30 mL
  Filled 2016-03-14: qty 30

## 2016-03-14 MED ORDER — INSULIN ASPART 100 UNIT/ML ~~LOC~~ SOLN
0.0000 [IU] | SUBCUTANEOUS | Status: DC
Start: 2016-03-14 — End: 2016-04-03
  Administered 2016-03-14 – 2016-03-15 (×6): 3 [IU] via SUBCUTANEOUS
  Administered 2016-03-15: 5 [IU] via SUBCUTANEOUS
  Administered 2016-03-15 – 2016-03-16 (×6): 3 [IU] via SUBCUTANEOUS
  Administered 2016-03-16: 2 [IU] via SUBCUTANEOUS
  Administered 2016-03-17 (×3): 3 [IU] via SUBCUTANEOUS
  Administered 2016-03-17: 2 [IU] via SUBCUTANEOUS
  Administered 2016-03-17: 3 [IU] via SUBCUTANEOUS
  Administered 2016-03-18: 2 [IU] via SUBCUTANEOUS
  Administered 2016-03-18 (×2): 3 [IU] via SUBCUTANEOUS
  Administered 2016-03-18: 2 [IU] via SUBCUTANEOUS
  Administered 2016-03-18: 3 [IU] via SUBCUTANEOUS
  Administered 2016-03-18 – 2016-03-19 (×5): 2 [IU] via SUBCUTANEOUS
  Administered 2016-03-20: 3 [IU] via SUBCUTANEOUS
  Administered 2016-03-20 – 2016-03-22 (×7): 2 [IU] via SUBCUTANEOUS
  Administered 2016-03-22: 3 [IU] via SUBCUTANEOUS
  Administered 2016-03-23 (×3): 2 [IU] via SUBCUTANEOUS
  Administered 2016-03-23: 1 [IU] via SUBCUTANEOUS
  Administered 2016-03-24 – 2016-03-25 (×6): 2 [IU] via SUBCUTANEOUS
  Administered 2016-03-27: 3 [IU] via SUBCUTANEOUS
  Administered 2016-03-27: 2 [IU] via SUBCUTANEOUS
  Administered 2016-03-28: 3 [IU] via SUBCUTANEOUS
  Administered 2016-03-28: 2 [IU] via SUBCUTANEOUS
  Administered 2016-03-28: 3 [IU] via SUBCUTANEOUS
  Administered 2016-03-29 (×3): 2 [IU] via SUBCUTANEOUS
  Administered 2016-03-29: 3 [IU] via SUBCUTANEOUS
  Administered 2016-03-30 (×2): 2 [IU] via SUBCUTANEOUS
  Administered 2016-03-30 – 2016-04-01 (×5): 3 [IU] via SUBCUTANEOUS
  Administered 2016-04-01: 2 [IU] via SUBCUTANEOUS
  Administered 2016-04-01: 3 [IU] via SUBCUTANEOUS
  Administered 2016-04-02 (×4): 2 [IU] via SUBCUTANEOUS
  Administered 2016-04-03 (×2): 3 [IU] via SUBCUTANEOUS

## 2016-03-14 MED ORDER — VITAL HIGH PROTEIN PO LIQD
1000.0000 mL | ORAL | Status: DC
  Administered 2016-03-14: 1000 mL
  Administered 2016-03-14: 15:00:00

## 2016-03-14 NOTE — Telephone Encounter (Signed)
Called Kathlene NovemberMike from [employer] & he states he came to our office and talked to Dr Roda ShuttersXU and clarified

## 2016-03-14 NOTE — Care Management Important Message (Signed)
Important Message  Patient Details  Name: [name] MRN: [id number] Date of Birth: 1951/03/26   Medicare Important Message Given:  Other (see comment)    Odean Fester [name] 03/14/2016, 9:22 AM

## 2016-03-14 NOTE — Progress Notes (Signed)
Orthopedic Tech Progress Note Patient Details:  [name] 1951/05/05 [id number] Gave brace order for Rt unlocked bledsoe brace [facility] Patient ID: Jasmine HouseholderBrenda A Ayala, female   DOB: 1951/05/05, 65 y.o.   MRN: [id number]   Clois Dupesvery S [name] 03/14/2016, 8:41 AM

## 2016-03-14 NOTE — Telephone Encounter (Signed)
Kathlene NovemberMike from [facility] called to verify orders for Pt. His call back number is [phone].

## 2016-03-14 NOTE — Progress Notes (Signed)
   Subjective:  Patient intubated.  Objective:   VITALS:   Vitals:   03/14/16 0329 03/14/16 0400 03/14/16 0500 03/14/16 0600  BP:  94/67 99/71 100/77  Pulse: (!) 114 (!) 112 (!) 111 (!) 113  Resp:  20 20 (!) 21  Temp:  98.9 F (37.2 C)    TempSrc:  Axillary    SpO2: 100% 100% 100% 100%  Weight:      Height:        Intact pulses distally Compartment soft   Lab Results  Component Value Date   WBC 12.8 (H) 03/14/2016   HGB 8.8 (L) 03/14/2016   HCT 28.3 (L) 03/14/2016   MCV 89.6 03/14/2016   PLT 176 03/14/2016     Assessment/Plan:  1 Day Post-Op   - unlocked bledsoe brace ordered from [facility] who will bring today - NWB RLE - up with PT/OT when able  Glee ArvinMichael Xu 03/14/2016, 7:34 AM [phone]
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
## 2016-03-14 NOTE — Plan of Care (Signed)
Remove KI to allow bledose brace
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
## 2016-03-14 NOTE — Progress Notes (Signed)
Orthopedic Tech Progress Note Patient Details:  [name] 09-28-1950 [id number] [facility] Patient ID: Jasmine HouseholderBrenda A Ayala, female   DOB: 09-28-1950, 65 y.o.   MRN: [id number]   [name] 03/14/2016, 12:34 PM

## 2016-03-15 ENCOUNTER — Encounter: Payer: Medicare Other | Admitting: Gastroenterology

## 2016-03-15 ENCOUNTER — Inpatient Hospital Stay (HOSPITAL_COMMUNITY): Payer: Medicare Other

## 2016-03-15 ENCOUNTER — Encounter (HOSPITAL_COMMUNITY): Payer: Self-pay | Admitting: Orthopaedic Surgery

## 2016-03-15 LAB — MAGNESIUM
MAGNESIUM: 2.1 mg/dL (ref 1.7–2.4)
Magnesium: 2 mg/dL (ref 1.7–2.4)

## 2016-03-15 LAB — PHOSPHORUS
Phosphorus: 3.3 mg/dL (ref 2.5–4.6)
Phosphorus: 3.4 mg/dL (ref 2.5–4.6)

## 2016-03-15 LAB — BASIC METABOLIC PANEL
Anion gap: 3 — ABNORMAL LOW (ref 5–15)
BUN: 32 mg/dL — AB (ref 6–20)
CO2: 26 mmol/L (ref 22–32)
Calcium: 8.1 mg/dL — ABNORMAL LOW (ref 8.9–10.3)
Chloride: 118 mmol/L — ABNORMAL HIGH (ref 101–111)
Creatinine, Ser: 0.57 mg/dL (ref 0.44–1.00)
GFR calc Af Amer: >60 mL/min (ref >60)
GLUCOSE: 202 mg/dL — AB (ref 65–99)
POTASSIUM: 4.6 mmol/L (ref 3.5–5.1)
Sodium: 147 mmol/L — ABNORMAL HIGH (ref 135–145)

## 2016-03-15 LAB — CBC
HCT: 28.6 % — ABNORMAL LOW (ref 36.0–46.0)
Hemoglobin: 8.7 g/dL — ABNORMAL LOW (ref 12.0–15.0)
MCH: 28 pg (ref 26.0–34.0)
MCHC: 30.4 g/dL (ref 30.0–36.0)
MCV: 92 fL (ref 78.0–100.0)
PLATELETS: 204 10*3/uL (ref 150–400)
RBC: 3.11 MIL/uL — AB (ref 3.87–5.11)
RDW: 20.5 % — ABNORMAL HIGH (ref 11.5–15.5)
WBC: 12.8 10*3/uL — ABNORMAL HIGH (ref 4.0–10.5)

## 2016-03-15 LAB — GLUCOSE, CAPILLARY
GLUCOSE-CAPILLARY: 183 mg/dL — AB (ref 65–99)
GLUCOSE-CAPILLARY: 185 mg/dL — AB (ref 65–99)
GLUCOSE-CAPILLARY: 181 mg/dL — AB (ref 65–99)
Glucose-Capillary: 213 mg/dL — ABNORMAL HIGH (ref 65–99)
Glucose-Capillary: 200 mg/dL — ABNORMAL HIGH (ref 65–99)

## 2016-03-15 MED ORDER — PIPERACILLIN-TAZOBACTAM 3.375 G IVPB
3.3750 g | Freq: Three times a day (TID) | INTRAVENOUS | Status: DC
  Administered 2016-03-15 – 2016-03-18 (×9): 3.375 g via INTRAVENOUS
  Filled 2016-03-15 (×10): qty 50

## 2016-03-15 MED ORDER — VANCOMYCIN HCL IN DEXTROSE 1-5 GM/200ML-% IV SOLN
1000.0000 mg | Freq: Three times a day (TID) | INTRAVENOUS | Status: DC
  Administered 2016-03-15 – 2016-03-16 (×3): 1000 mg via INTRAVENOUS
  Filled 2016-03-15 (×5): qty 200

## 2016-03-15 MED ORDER — FAMOTIDINE 40 MG/5ML PO SUSR
20.0000 mg | Freq: Two times a day (BID) | ORAL | Status: DC
  Administered 2016-03-15 – 2016-04-03 (×39): 20 mg
  Filled 2016-03-15 (×42): qty 2.5

## 2016-03-15 MED ORDER — PIPERACILLIN-TAZOBACTAM 3.375 G IVPB
3.3750 g | Freq: Three times a day (TID) | INTRAVENOUS | Status: DC
  Administered 2016-03-15: 3.375 g via INTRAVENOUS
  Filled 2016-03-15 (×3): qty 50

## 2016-03-15 MED ORDER — METOPROLOL TARTRATE 25 MG/10 ML ORAL SUSPENSION
12.5000 mg | Freq: Two times a day (BID) | ORAL | Status: DC
  Administered 2016-03-15 – 2016-03-20 (×8): 12.5 mg
  Filled 2016-03-15 (×14): qty 10

## 2016-03-15 MED ORDER — FREE WATER
200.0000 mL | Freq: Three times a day (TID) | Status: DC
  Administered 2016-03-15 – 2016-03-19 (×13): 200 mL

## 2016-03-15 MED ORDER — PIVOT 1.5 CAL PO LIQD
1000.0000 mL | ORAL | Status: DC
  Administered 2016-03-15 – 2016-03-21 (×5): 1000 mL
  Filled 2016-03-15 (×2): qty 1000

## 2016-03-15 MED ORDER — INSULIN GLARGINE 100 UNIT/ML ~~LOC~~ SOLN
10.0000 [IU] | Freq: Two times a day (BID) | SUBCUTANEOUS | Status: DC
  Administered 2016-03-15 – 2016-04-03 (×39): 10 [IU] via SUBCUTANEOUS
  Filled 2016-03-15 (×41): qty 0.1
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
## 2016-03-15 NOTE — Progress Notes (Signed)
Per Dr. Janee Mornhompson, will leave Foley catheter in at this time d/t presence of post-op right hip/femur pressure dressing and Bledsoe knee brace adjacent to patient's perineum.
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
## 2016-03-15 NOTE — Anesthesia Postprocedure Evaluation (Signed)
Anesthesia Post Note  Patient: [name]  Procedure(s) Performed: Procedure(s) (LRB): EXTERNAL FIXATION RIGHT LEG OPEN REDUCTION INTERNAL FIXATION (ORIF) RIGHT DISTAL FEMUR FRACTURE (Right)  Patient location during evaluation: PACU Anesthesia Type: General Level of consciousness: sedated and patient remains intubated per anesthesia plan Pain management: pain level controlled Vital Signs Assessment: post-procedure vital signs reviewed and stable Respiratory status: patient remains intubated per anesthesia plan Cardiovascular status: stable Anesthetic complications: no    Last Vitals:  Vitals:   03/15/16 0805 03/15/16 0830  BP: (!) 118/101   Pulse: (!) 121 68  Resp: (!) 33 (!) 30  Temp:      Last Pain:  Vitals:   03/15/16 0800  TempSrc: Axillary  PainSc:                  Lewie LoronJohn Beyonce Sawatzky

## 2016-03-15 NOTE — Progress Notes (Signed)
Sedation (propofol) turned off around 2300.  Have been giving Fentanyl and dilaudid pushes for comfort.  Pt will open eye via name and will withdraw from pain (left leg).

## 2016-03-15 NOTE — Progress Notes (Signed)
   Subjective:  Still intubated and sedated.  Objective:   VITALS:   Vitals:   03/15/16 0700 03/15/16 0800 03/15/16 0805 03/15/16 0830  BP: 123/60 (!) 118/101 (!) 118/101   Pulse: (!) 115 (!) 115 (!) 121 68  Resp: (!) 24 (!) 27 (!) 33 (!) 30  Temp:  100.3 F (37.9 C)    TempSrc:  Axillary    SpO2: 100% 100% 100% 98%  Weight:      Height:        Withdraws to pain Foot wwp Dressing c/d/i   Lab Results  Component Value Date   WBC 12.8 (H) 03/15/2016   HGB 8.7 (L) 03/15/2016   HCT 28.6 (L) 03/15/2016   MCV 92.0 03/15/2016   PLT 204 03/15/2016     Assessment/Plan:  2 Days Post-Op   - stable from ortho stand point - NWB - up with PT when able  [name] 03/15/2016, 9:52 AM [phone]
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
## 2016-03-15 NOTE — Progress Notes (Signed)
OT Cancellation Note  Patient Details Name: [name] MRN: [id number] DOB: 08-Sep-1950   Cancelled Treatment:    Reason Eval/Treat Not Completed: Patient not medically ready (intubated and sedated). Will follow up for OT eval when pt more medically appropriate.  Gaye AlkenBailey A Dequon Schnebly M.S., OTR/L Pager: [phone]  03/15/2016, 11:45 AM

## 2016-03-16 LAB — GLUCOSE, CAPILLARY
GLUCOSE-CAPILLARY: 141 mg/dL — AB (ref 65–99)
GLUCOSE-CAPILLARY: 156 mg/dL — AB (ref 65–99)
GLUCOSE-CAPILLARY: 169 mg/dL — AB (ref 65–99)
GLUCOSE-CAPILLARY: 194 mg/dL — AB (ref 65–99)
Glucose-Capillary: 158 mg/dL — ABNORMAL HIGH (ref 65–99)
Glucose-Capillary: 183 mg/dL — ABNORMAL HIGH (ref 65–99)
Glucose-Capillary: 184 mg/dL — ABNORMAL HIGH (ref 65–99)

## 2016-03-16 LAB — BASIC METABOLIC PANEL
Anion gap: 4 — ABNORMAL LOW (ref 5–15)
BUN: 37 mg/dL — ABNORMAL HIGH (ref 6–20)
CALCIUM: 8.1 mg/dL — AB (ref 8.9–10.3)
CO2: 26 mmol/L (ref 22–32)
CREATININE: 0.66 mg/dL (ref 0.44–1.00)
Chloride: 116 mmol/L — ABNORMAL HIGH (ref 101–111)
Glucose, Bld: 172 mg/dL — ABNORMAL HIGH (ref 65–99)
Potassium: 4.3 mmol/L (ref 3.5–5.1)
Sodium: 146 mmol/L — ABNORMAL HIGH (ref 135–145)

## 2016-03-16 LAB — CBC
HEMATOCRIT: 28.4 % — AB (ref 36.0–46.0)
Hemoglobin: 8.4 g/dL — ABNORMAL LOW (ref 12.0–15.0)
MCH: 27.3 pg (ref 26.0–34.0)
MCHC: 29.6 g/dL — AB (ref 30.0–36.0)
MCV: 92.2 fL (ref 78.0–100.0)
PLATELETS: 232 10*3/uL (ref 150–400)
RBC: 3.08 MIL/uL — ABNORMAL LOW (ref 3.87–5.11)
RDW: 21.2 % — AB (ref 11.5–15.5)
WBC: 14.4 10*3/uL — ABNORMAL HIGH (ref 4.0–10.5)

## 2016-03-16 LAB — PREPARE RBC (CROSSMATCH)

## 2016-03-16 LAB — URINE CULTURE: CULTURE: NO GROWTH

## 2016-03-16 MED ORDER — SODIUM CHLORIDE 0.9 % IV SOLN
1250.0000 mg | Freq: Two times a day (BID) | INTRAVENOUS | Status: DC
  Administered 2016-03-16 – 2016-03-18 (×4): 1250 mg via INTRAVENOUS
  Filled 2016-03-16 (×5): qty 1250

## 2016-03-16 MED ORDER — SODIUM CHLORIDE 0.9 % IV SOLN
Freq: Once | INTRAVENOUS | Status: AC
  Administered 2016-03-16: 14:00:00 via INTRAVENOUS

## 2016-03-16 MED ORDER — BISACODYL 10 MG RE SUPP
10.0000 mg | Freq: Every day | RECTAL | Status: DC | PRN
  Administered 2016-03-16: 10 mg via RECTAL
  Filled 2016-03-16: qty 1
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
## 2016-03-16 NOTE — Progress Notes (Signed)
Subjective: 3 Days Post-Op Procedure(s) (LRB): EXTERNAL FIXATION RIGHT LEG OPEN REDUCTION INTERNAL FIXATION (ORIF) RIGHT DISTAL FEMUR FRACTURE (Right) Dr. Roda ShuttersXu 5 days s/p IMN R femur by Dr. Aundria Rudogers Pt intubated and sedated. Per nursing Asher Muir(Jamie) pressures have been soft, she is somewhat tachy, appears uncomfortable. They will discuss with trauma on rounds.  Objective: Vital signs in last 24 hours: Temp:  [97.5 F (36.4 C)-101.2 F (38.4 C)] 101.2 F (38.4 C) (10/28 0800) Pulse Rate:  [63-131] 120 (10/28 0833) Resp:  [20-38] 24 (10/28 0833) BP: (81-135)/(55-87) 96/79 (10/28 0833) SpO2:  [94 %-100 %] 100 % (10/28 0833) FiO2 (%):  [40 %] 40 % (10/28 0833) Weight:  [96.8 kg (213 lb 6.5 oz)] 96.8 kg (213 lb 6.5 oz) (10/28 0500)  Intake/Output from previous day: 10/27 0701 - 10/28 0700 In: 4462.6 [I.V.:2250; NG/GT:1562.6; IV Piggyback:650] Out: 1420 [Urine:1420] Intake/Output this shift: Total I/O In: 135 [I.V.:100; NG/GT:35] Out: 135 [Urine:135]   Recent Labs  03/14/16 0410 03/15/16 0355 03/16/16 0545  HGB 8.8* 8.7* 8.4*    Recent Labs  03/15/16 0355 03/16/16 0545  WBC 12.8* 14.4*  RBC 3.11* 3.08*  HCT 28.6* 28.4*  PLT 204 232    Recent Labs  03/15/16 0355 03/16/16 0545  NA 147* 146*  K 4.6 4.3  CL 118* 116*  CO2 26 26  BUN 32* 37*  CREATININE 0.57 0.66  GLUCOSE 202* 172*  CALCIUM 8.1* 8.1*   No results for input(s): LABPT, INR in the last 72 hours.  Neurologically intact ABD soft Neurovascular intact Intact pulses distally Incision: dressing C/D/I and no drainage No cellulitis present Compartment soft Bledsoe brace in place  Assessment/Plan: 3 Days Post-Op Procedure(s) (LRB): EXTERNAL FIXATION RIGHT LEG OPEN REDUCTION INTERNAL FIXATION (ORIF) RIGHT DISTAL FEMUR FRACTURE (Right) 5 days s/p IM nail R femur Briefly discussed with Dr. Kathlen Modysuei  BISSELL, JACLYN M. 03/16/2016, 9:09 AM
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
## 2016-03-16 NOTE — Progress Notes (Signed)
Subjective:  Remains intubated sedated on ventilator, uncomfortable appearing, not able to get history  Objective:  Vital Signs in the last 24 hours: BP 99/84   Pulse (!) 120   Temp (!) 101.2 F (38.4 C) (Axillary)   Resp (!) 29   Ht 5\' 5"  (1.651 m)   Wt 96.8 kg (213 lb 6.5 oz)   SpO2 100%   BMI 35.51 kg/m   Physical Exam: Pale female lying in bed intubated Lungs: Reduced breath sounds  Chest wall: hematoma and swelling noted above right clavicle Cardiac:  Rapid Regular rhythm, normal S1 and S2, no S3 Abdomen:  Soft, nontender, no masses Extremities:  Right leg in cast with brace on   Intake/Output from previous day: 10/27 0701 - 10/28 0700 In: 4462.6 [I.V.:2250; NG/GT:1562.6; IV Piggyback:650] Out: 1420 [Urine:1420] Weight Filed Weights   03/08/16 0031 03/12/16 0804 03/16/16 0500  Weight: 80.3 kg (177 lb 0.5 oz) 88.2 kg (194 lb 7.1 oz) 96.8 kg (213 lb 6.5 oz)    Lab Results: Basic Metabolic Panel:  Recent Labs  91/47/8210/27/17 0355 03/16/16 0545  NA 147* 146*  K 4.6 4.3  CL 118* 116*  CO2 26 26  GLUCOSE 202* 172*  BUN 32* 37*  CREATININE 0.57 0.66    CBC:  Recent Labs  03/15/16 0355 03/16/16 0545  WBC 12.8* 14.4*  HGB 8.7* 8.4*  HCT 28.6* 28.4*  MCV 92.0 92.2  PLT 204 232    BNP    Component Value Date/Time   BNP 1,384.9 (H) 03/12/2016 1000   Telemetry: Sinus tachycardia with PACs  Assessment/Plan:  1.  Cardiomyopathy with acute heart failure-possible cardiac contusion versus due to hypotension 2.  Ongoing tachycardia with borderline blood pressure  Recommendations:  Blood pressure is borderline and metoprolol was increased yesterday.  Still tachycardic.  May benefit from transfusion to help oxygen demands.     Darden PalmerW. Spencer Tilley, Jr.  MD [facility]  03/16/2016, 10:20 AM

## 2016-03-17 LAB — TYPE AND SCREEN
ABO/RH(D): A POS
ANTIBODY SCREEN: NEGATIVE
Unit division: 0

## 2016-03-17 LAB — CBC
HCT: 31.8 % — ABNORMAL LOW (ref 36.0–46.0)
HEMOGLOBIN: 9.7 g/dL — AB (ref 12.0–15.0)
MCH: 28.2 pg (ref 26.0–34.0)
MCHC: 30.5 g/dL (ref 30.0–36.0)
MCV: 92.4 fL (ref 78.0–100.0)
Platelets: 288 10*3/uL (ref 150–400)
RBC: 3.44 MIL/uL — AB (ref 3.87–5.11)
RDW: 22.2 % — ABNORMAL HIGH (ref 11.5–15.5)
WBC: 15.8 10*3/uL — AB (ref 4.0–10.5)

## 2016-03-17 LAB — BASIC METABOLIC PANEL
ANION GAP: 5 (ref 5–15)
BUN: 39 mg/dL — AB (ref 6–20)
CHLORIDE: 115 mmol/L — AB (ref 101–111)
CO2: 24 mmol/L (ref 22–32)
Calcium: 7.8 mg/dL — ABNORMAL LOW (ref 8.9–10.3)
Creatinine, Ser: 0.64 mg/dL (ref 0.44–1.00)
GFR calc Af Amer: >60 mL/min (ref >60)
GFR calc non Af Amer: >60 mL/min (ref >60)
GLUCOSE: 195 mg/dL — AB (ref 65–99)
POTASSIUM: 4.4 mmol/L (ref 3.5–5.1)
SODIUM: 144 mmol/L (ref 135–145)

## 2016-03-17 LAB — GLUCOSE, CAPILLARY
GLUCOSE-CAPILLARY: 117 mg/dL — AB (ref 65–99)
GLUCOSE-CAPILLARY: 164 mg/dL — AB (ref 65–99)
GLUCOSE-CAPILLARY: 175 mg/dL — AB (ref 65–99)
GLUCOSE-CAPILLARY: 187 mg/dL — AB (ref 65–99)
Glucose-Capillary: 159 mg/dL — ABNORMAL HIGH (ref 65–99)
Glucose-Capillary: 175 mg/dL — ABNORMAL HIGH (ref 65–99)

## 2016-03-17 LAB — CULTURE, RESPIRATORY

## 2016-03-17 LAB — CULTURE, RESPIRATORY W GRAM STAIN

## 2016-03-17 MED ORDER — ACETAMINOPHEN 160 MG/5ML PO SOLN
650.0000 mg | Freq: Four times a day (QID) | ORAL | Status: DC | PRN
  Administered 2016-03-17 – 2016-04-02 (×12): 650 mg
  Filled 2016-03-17 (×13): qty 20.3

## 2016-03-17 NOTE — Progress Notes (Signed)
Patient ID: Jasmine HouseholderBrenda A Ayala, female   DOB: 10-31-1950, 65 y.o.   MRN: [id number] Subjective: 4 Days Post-Op Procedure(s) (LRB): EXTERNAL FIXATION RIGHT LEG OPEN REDUCTION INTERNAL FIXATION (ORIF) RIGHT DISTAL FEMUR FRACTURE (Right)    Patient remains intubated.  Reviewed general status with her nurse 10 days in ICU Remains sedated due to agitation  Objective:   VITALS:   Vitals:   03/17/16 1151 03/17/16 1200  BP: 100/67 92/63  Pulse: (!) 103 (!) 109  Resp: (!) 27 (!) 29  Temp:      Incision: dressing C/D/I, post op dressing and T-ROM brace intact and in place  LABS  Recent Labs  03/15/16 0355 03/16/16 0545  HGB 8.7* 8.4*  HCT 28.6* 28.4*  WBC 12.8* 14.4*  PLT 204 232     Recent Labs  03/15/16 0355 03/16/16 0545 03/17/16 1157  NA 147* 146* 144  K 4.6 4.3 4.4  BUN 32* 37* 39*  CREATININE 0.57 0.66 0.64  GLUCOSE 202* 172* 195*    No results for input(s): LABPT, INR in the last 72 hours.   Assessment/Plan: 4 Days Post-Op Procedure(s) (LRB): EXTERNAL FIXATION RIGHT LEG OPEN REDUCTION INTERNAL FIXATION (ORIF) RIGHT DISTAL FEMUR FRACTURE (Right)   Future treatment plans pending her medical progress Continue ICU care Orthopaedic treatment per Dr. Aundria Rudogers
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
## 2016-03-17 NOTE — Progress Notes (Signed)
Subjective:  Remains intubated sedated on ventilator, uncomfortable appearing, not able to get history but eyes are open today.  Receive transfusion yesterday with some improvement and tachycardia.  Some SVT noted.  Currently off of pressors.  Objective:  Vital Signs in the last 24 hours: BP 97/75   Pulse 66   Temp (!) 101.8 F (38.8 C) (Oral)   Resp (!) 35   Ht 5\' 5"  (1.651 m)   Wt 100.8 kg (222 lb 3.6 oz)   SpO2 94%   BMI 36.98 kg/m   Physical Exam: Pale female lying in bed intubated Lungs: Reduced breath sounds  Chest wall: hematoma and swelling noted above right clavicle Cardiac:  Rapid Regular rhythm, normal S1 and S2, no S3 Abdomen:  Soft, nontender, no masses Extremities:  Right leg in cast with brace on   Intake/Output from previous day: 10/28 0701 - 10/29 0700 In: 4161.7 [I.V.:2400; Blood:466.7; NG/GT:995; IV Piggyback:300] Out: 1685 [Urine:1685] Weight Filed Weights   03/12/16 0804 03/16/16 0500 03/17/16 0446  Weight: 88.2 kg (194 lb 7.1 oz) 96.8 kg (213 lb 6.5 oz) 100.8 kg (222 lb 3.6 oz)    Lab Results: Basic Metabolic Panel:  Recent Labs  16/02/9609/27/17 0355 03/16/16 0545  NA 147* 146*  K 4.6 4.3  CL 118* 116*  CO2 26 26  GLUCOSE 202* 172*  BUN 32* 37*  CREATININE 0.57 0.66    CBC:  Recent Labs  03/15/16 0355 03/16/16 0545  WBC 12.8* 14.4*  HGB 8.7* 8.4*  HCT 28.6* 28.4*  MCV 92.0 92.2  PLT 204 232    BNP    Component Value Date/Time   BNP 1,384.9 (H) 03/12/2016 1000   Telemetry: Sinus tachycardia with PACs  Assessment/Plan:  1.  Cardiomyopathy with acute heart failure-possible cardiac contusion versus due to hypotension 2.  Ongoing tachycardia with borderline blood pressureSomewhat better with transfusion 3.  Blood loss anemia following transfusion yesterday  Recommendations:  Recheck CBC today.  May try to increase beta blocker if blood pressure holds.     Darden PalmerW. Spencer Kyliee Ortego, Jr.  MD [facility]iology  03/17/2016, 11:16 AM

## 2016-03-18 ENCOUNTER — Inpatient Hospital Stay (HOSPITAL_COMMUNITY): Payer: Medicare Other

## 2016-03-18 DIAGNOSIS — I429 Cardiomyopathy, unspecified: Secondary | ICD-10-CM

## 2016-03-18 DIAGNOSIS — R000 Tachycardia, unspecified: Secondary | ICD-10-CM

## 2016-03-18 LAB — GLUCOSE, CAPILLARY
GLUCOSE-CAPILLARY: 130 mg/dL — AB (ref 65–99)
Glucose-Capillary: 104 mg/dL — ABNORMAL HIGH (ref 65–99)
Glucose-Capillary: 129 mg/dL — ABNORMAL HIGH (ref 65–99)
Glucose-Capillary: 139 mg/dL — ABNORMAL HIGH (ref 65–99)
Glucose-Capillary: 151 mg/dL — ABNORMAL HIGH (ref 65–99)
Glucose-Capillary: 165 mg/dL — ABNORMAL HIGH (ref 65–99)

## 2016-03-18 LAB — BASIC METABOLIC PANEL
Anion gap: 4 — ABNORMAL LOW (ref 5–15)
BUN: 37 mg/dL — AB (ref 6–20)
CO2: 26 mmol/L (ref 22–32)
CREATININE: 0.62 mg/dL (ref 0.44–1.00)
Calcium: 8 mg/dL — ABNORMAL LOW (ref 8.9–10.3)
Chloride: 118 mmol/L — ABNORMAL HIGH (ref 101–111)
GFR calc Af Amer: >60 mL/min (ref >60)
GLUCOSE: 130 mg/dL — AB (ref 65–99)
POTASSIUM: 4 mmol/L (ref 3.5–5.1)
Sodium: 148 mmol/L — ABNORMAL HIGH (ref 135–145)

## 2016-03-18 LAB — BLOOD GAS, ARTERIAL
ACID-BASE EXCESS: 1.4 mmol/L (ref 0.0–2.0)
Bicarbonate: 25 mmol/L (ref 20.0–28.0)
DRAWN BY: 301361
FIO2: 40
O2 SAT: 95.5 %
PATIENT TEMPERATURE: 98.4
PEEP/CPAP: 5 cmH2O
PH ART: 7.449 (ref 7.350–7.450)
RATE: 16 resp/min
VT: 500 mL
pCO2 arterial: 36.6 mmHg (ref 32.0–48.0)
pO2, Arterial: 76.3 mmHg — ABNORMAL LOW (ref 83.0–108.0)

## 2016-03-18 LAB — CBC
HCT: 31.3 % — ABNORMAL LOW (ref 36.0–46.0)
Hemoglobin: 9.4 g/dL — ABNORMAL LOW (ref 12.0–15.0)
MCH: 28.2 pg (ref 26.0–34.0)
MCHC: 30 g/dL (ref 30.0–36.0)
MCV: 94 fL (ref 78.0–100.0)
PLATELETS: 318 10*3/uL (ref 150–400)
RBC: 3.33 MIL/uL — AB (ref 3.87–5.11)
RDW: 22.1 % — ABNORMAL HIGH (ref 11.5–15.5)
WBC: 16.2 10*3/uL — ABNORMAL HIGH (ref 4.0–10.5)

## 2016-03-18 MED ORDER — ALBUMIN HUMAN 5 % IV SOLN
12.5000 g | Freq: Once | INTRAVENOUS | Status: AC
  Administered 2016-03-18: 12.5 g via INTRAVENOUS
  Filled 2016-03-18: qty 250

## 2016-03-18 MED ORDER — FENTANYL CITRATE (PF) 100 MCG/2ML IJ SOLN
50.0000 ug | Freq: Once | INTRAMUSCULAR | Status: AC
  Administered 2016-03-18: 50 ug via INTRAVENOUS

## 2016-03-18 MED ORDER — CLONAZEPAM 0.5 MG PO TBDP
0.5000 mg | ORAL_TABLET | Freq: Two times a day (BID) | ORAL | Status: DC
  Administered 2016-03-18 – 2016-04-01 (×30): 0.5 mg
  Filled 2016-03-18 (×30): qty 1

## 2016-03-18 MED ORDER — FENTANYL BOLUS VIA INFUSION
25.0000 ug | INTRAVENOUS | Status: DC | PRN
  Administered 2016-03-25: 25 ug via INTRAVENOUS
  Administered 2016-03-27: 50 ug via INTRAVENOUS
  Administered 2016-03-28 – 2016-04-01 (×3): 25 ug via INTRAVENOUS
  Filled 2016-03-18: qty 25

## 2016-03-18 MED ORDER — CHLORHEXIDINE GLUCONATE 0.12 % MT SOLN
OROMUCOSAL | Status: AC
  Filled 2016-03-18: qty 15

## 2016-03-18 MED ORDER — FENTANYL 2500MCG IN NS 250ML (10MCG/ML) PREMIX INFUSION
25.0000 ug/h | INTRAVENOUS | Status: DC
  Administered 2016-03-18 – 2016-03-24 (×4): 25 ug/h via INTRAVENOUS
  Administered 2016-03-26: 50 ug/h via INTRAVENOUS
  Administered 2016-03-28: 75 ug/h via INTRAVENOUS
  Administered 2016-03-28 – 2016-03-31 (×4): 100 ug/h via INTRAVENOUS
  Filled 2016-03-18 (×9): qty 250

## 2016-03-18 MED ORDER — DEXTROSE 5 % IV SOLN
2.0000 g | INTRAVENOUS | Status: AC
  Administered 2016-03-18 – 2016-03-27 (×10): 2 g via INTRAVENOUS
  Filled 2016-03-18 (×10): qty 2

## 2016-03-18 MED ORDER — DEXMEDETOMIDINE HCL IN NACL 200 MCG/50ML IV SOLN
0.0000 ug/kg/h | INTRAVENOUS | Status: AC
  Administered 2016-03-18: 0.4 ug/kg/h via INTRAVENOUS
  Filled 2016-03-18: qty 50

## 2016-03-18 MED ORDER — FUROSEMIDE 10 MG/ML IJ SOLN
40.0000 mg | Freq: Once | INTRAMUSCULAR | Status: AC
  Administered 2016-03-18: 40 mg via INTRAVENOUS
  Filled 2016-03-18: qty 4

## 2016-03-18 MED ORDER — QUETIAPINE FUMARATE 100 MG PO TABS
100.0000 mg | ORAL_TABLET | Freq: Three times a day (TID) | ORAL | Status: DC
  Administered 2016-03-18 – 2016-04-03 (×46): 100 mg
  Filled 2016-03-18 (×46): qty 1

## 2016-03-18 MED ORDER — IPRATROPIUM-ALBUTEROL 0.5-2.5 (3) MG/3ML IN SOLN
3.0000 mL | Freq: Four times a day (QID) | RESPIRATORY_TRACT | Status: DC
  Administered 2016-03-18 – 2016-03-29 (×45): 3 mL via RESPIRATORY_TRACT
  Filled 2016-03-18 (×45): qty 3

## 2016-03-18 MED ORDER — SODIUM CHLORIDE 0.9 % IV BOLUS (SEPSIS)
500.0000 mL | Freq: Once | INTRAVENOUS | Status: AC
  Administered 2016-03-18: 500 mL via INTRAVENOUS

## 2016-03-18 MED ORDER — KCL IN DEXTROSE-NACL 20-5-0.45 MEQ/L-%-% IV SOLN
INTRAVENOUS | Status: DC
  Administered 2016-03-18 – 2016-03-22 (×6): via INTRAVENOUS
  Filled 2016-03-18 (×8): qty 1000

## 2016-03-18 MED ORDER — MIDAZOLAM HCL 2 MG/2ML IJ SOLN: 1.0000 mg | INTRAMUSCULAR | Status: DC | PRN

## 2016-03-18 MED ORDER — MIDAZOLAM HCL 2 MG/2ML IJ SOLN
1.0000 mg | INTRAMUSCULAR | Status: DC | PRN
  Administered 2016-03-26 – 2016-04-01 (×2): 1 mg via INTRAVENOUS
  Filled 2016-03-18 (×2): qty 2
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
## 2016-03-18 NOTE — Progress Notes (Signed)
Follow up - Trauma and Critical Care  Patient Details:    [name] is an 65 y.o. female.  Lines/tubes : Airway 7.5 mm (Active)  Secured at (cm) 22 cm 03/18/2016  8:08 AM  Measured From Lips 03/18/2016  8:08 AM  Secured Location Center 03/18/2016  8:08 AM  Secured By [employer] 03/18/2016  8:08 AM  Tube Holder Repositioned Yes 03/18/2016  8:08 AM  Cuff Pressure (cm H2O) 24 cm H2O 03/18/2016  3:06 AM  Site Condition Dry 03/18/2016  8:08 AM     PICC Double Lumen 02/26/2016 PICC Right Basilic 41 cm 3 cm (Active)  Indication for Insertion or Continuance of Line Prolonged intravenous therapies 03/18/2016  7:29 AM  Exposed Catheter (cm) 3 cm 02/28/2016  9:00 AM  Site Assessment Clean;Dry;Intact 03/18/2016  7:29 AM  Lumen #1 Status Blood return noted;Flushed;Infusing 03/18/2016  7:29 AM  Lumen #2 Status Blood return noted;Flushed;Infusing 03/18/2016  7:29 AM  Dressing Type Transparent;Occlusive 03/18/2016  7:29 AM  Dressing Status Clean;Dry;Intact;Antimicrobial disc in place 03/18/2016  7:29 AM  Line Care Connections checked and tightened;Line pulled back 03/18/2016  7:29 AM  Line Adjustment (NICU/IV Team Only) No 03/16/2016  8:00 PM  Dressing Intervention Other (Comment) 02/23/2016  7:25 PM  Dressing Change Due 03/18/16 03/18/2016  7:29 AM     Urethral Catheter [name] Double-lumen 14 Fr. (Active)  Indication for Insertion or Continuance of Catheter Other (comment) 03/17/2016  8:00 PM  Site Assessment Clean;Intact 03/17/2016  8:00 PM  Catheter Maintenance Bag below level of bladder;Catheter secured;Drainage bag/tubing not touching floor;Insertion date on drainage bag;No dependent loops;Seal intact 03/17/2016  8:00 PM  Collection Container Standard drainage bag 03/17/2016  8:00 PM  Securement Method Securing device (Describe) 03/17/2016  8:00 PM  Urinary Catheter Interventions Unclamped 03/17/2016  8:00 PM  Output (mL) 200 mL 03/18/2016  6:00 AM     Microbiology/Sepsis markers: Results for orders placed or performed during the hospital encounter of 03/11/2016  MRSA PCR Screening     Status: None   Collection Time: 03/08/16 12:31 AM  Result Value Ref Range Status   MRSA by PCR NEGATIVE NEGATIVE Final    Comment:        The GeneXpert MRSA Assay (FDA approved for NASAL specimens only), is one component of a comprehensive MRSA colonization surveillance program. It is not intended to diagnose MRSA infection nor to guide or monitor treatment for MRSA infections.   Culture, Urine     Status: None   Collection Time: 03/15/16  8:30 AM  Result Value Ref Range Status   Specimen Description URINE, CATHETERIZED  Final   Special Requests NONE  Final   Culture NO GROWTH  Final   Report Status 03/16/2016 FINAL  Final  Culture, respiratory (NON-Expectorated)     Status: None   Collection Time: 03/15/16  8:37 AM  Result Value Ref Range Status   Specimen Description TRACHEAL ASPIRATE  Final   Special Requests NONE  Final   Gram Stain   Final    RARE WBC PRESENT,BOTH PMN AND MONONUCLEAR RARE GRAM VARIABLE ROD    Culture MODERATE SERRATIA MARCESCENS  Final   Report Status 03/17/2016 FINAL  Final   Organism ID, Bacteria SERRATIA MARCESCENS  Final      Susceptibility   Serratia marcescens - MIC*    CEFAZOLIN >=64 RESISTANT Resistant     CEFEPIME <=1 SENSITIVE Sensitive     CEFTAZIDIME <=1 SENSITIVE Sensitive     CEFTRIAXONE <=1 SENSITIVE Sensitive  CIPROFLOXACIN <=0.25 SENSITIVE Sensitive     GENTAMICIN <=1 SENSITIVE Sensitive     TRIMETH/SULFA <=20 SENSITIVE Sensitive     * MODERATE SERRATIA MARCESCENS  Culture, blood (Routine X 2) w Reflex to ID Panel     Status: None (Preliminary result)   Collection Time: 03/15/16 10:12 AM  Result Value Ref Range Status   Specimen Description BLOOD LEFT ANTECUBITAL  Final   Special Requests BOTTLES DRAWN AEROBIC ONLY 5CC  Final   Culture NO GROWTH 2 DAYS  Final   Report Status PENDING   Incomplete  Culture, blood (Routine X 2) w Reflex to ID Panel     Status: None (Preliminary result)   Collection Time: 03/15/16 10:17 AM  Result Value Ref Range Status   Specimen Description BLOOD LEFT ANTECUBITAL  Final   Special Requests BOTTLES DRAWN AEROBIC ONLY 5CC  Final   Culture NO GROWTH 2 DAYS  Final   Report Status PENDING  Incomplete    Anti-infectives:  Anti-infectives    Start     Dose/Rate Route Frequency Ordered Stop   03/16/16 1600  vancomycin (VANCOCIN) 1,250 mg in sodium chloride 0.9 % 250 mL IVPB     1,250 mg 166.7 mL/hr over 90 Minutes Intravenous Every 12 hours 03/16/16 0932     03/15/16 1600  piperacillin-tazobactam (ZOSYN) IVPB 3.375 g     3.375 g 12.5 mL/hr over 240 Minutes Intravenous Every 8 hours 03/15/16 1149     03/15/16 1000  vancomycin (VANCOCIN) IVPB 1000 mg/200 mL premix  Status:  Discontinued     1,000 mg 200 mL/hr over 60 Minutes Intravenous Every 8 hours 03/15/16 0815 03/16/16 0932   03/15/16 0830  piperacillin-tazobactam (ZOSYN) IVPB 3.375 g  Status:  Discontinued     3.375 g 12.5 mL/hr over 240 Minutes Intravenous Every 8 hours 03/15/16 0758 03/15/16 1149   02/24/2016 2245  ceFAZolin (ANCEF) IVPB 2g/100 mL premix  Status:  Discontinued    Comments:  Anesthesia to give preop   2 g 200 mL/hr over 30 Minutes Intravenous  Once 02/27/2016 2231 03/04/2016 2235   03/18/2016 2200  ceFAZolin (ANCEF) IVPB 2g/100 mL premix     2 g 200 mL/hr over 30 Minutes Intravenous Every 8 hours 02/23/2016 1750 03/12/16 1416   02/24/2016 0800  ceFAZolin (ANCEF) IVPB 2g/100 mL premix     2 g 200 mL/hr over 30 Minutes Intravenous To [facility] 03/13/2016 0721 03/02/2016 1535      Best Practice/Protocols:  VTE Prophylaxis: Lovenox (prophylaxtic dose) and Mechanical GI Prophylaxis: Proton Pump Inhibitor Continous Sedation, actually on dilaudid and fentanyl intermittent sedation  Consults: Treatment Team:  [name], MD Yolonda Kida, MD Rounding  [facility], MD    Events:  Subjective:    Overnight Issues: Patient onto weaning very well.  Not on any continuous sedation.  Objective:  Vital signs for last 24 hours: Temp:  [100.1 F (37.8 C)-101.8 F (38.8 C)] 100.2 F (37.9 C) (10/30 0600) Pulse Rate:  [55-122] 99 (10/30 0800) Resp:  [19-37] 29 (10/30 0800) BP: (82-111)/(62-82) 93/70 (10/30 0806) SpO2:  [94 %-100 %] 99 % (10/30 0810) FiO2 (%):  [40 %] 40 % (10/30 0810) Weight:  [99.9 kg (220 lb 3.8 oz)] 99.9 kg (220 lb 3.8 oz) (10/30 0413)  Hemodynamic parameters for last 24 hours:    Intake/Output from previous day: 10/29 0701 - 10/30 0700 In: 3635 [I.V.:2400; NG/GT:935; IV Piggyback:300] Out: 1750 [Urine:1750]  Intake/Output this shift: Total I/O In: 185 [I.V.:100; NG/GT:35; IV Piggyback:50]  Out: -   Vent settings for last 24 hours: Vent Mode: PRVC FiO2 (%):  [40 %] 40 % Set Rate:  [16 bmp] 16 bmp Vt Set:  [500 mL] 500 mL PEEP:  [5 cmH20] 5 cmH20 Pressure Support:  [12 cmH20] 12 cmH20 Plateau Pressure:  [18 cmH20-25 cmH20] 25 cmH20  Physical Exam:  General: no respiratory distress and gets agitated when weaned on the ventilator. Neuro: nonfocal exam, RASS 0, RASS -1 and agitated Resp: wheezes LLL and LUL and lots of secretions. CVS: sinus tachycardia GI: soft, nontender, BS WNL, no r/g and having diarrhea continuously Extremities: edema 2+ and anasarca  Results for orders placed or performed during the hospital encounter of 03/06/2016 (from the past 24 hour(s))  Glucose, capillary     Status: Abnormal   Collection Time: 03/17/16 11:23 AM  Result Value Ref Range   Glucose-Capillary 175 (H) 65 - 99 mg/dL  CBC     Status: Abnormal   Collection Time: 03/17/16 11:57 AM  Result Value Ref Range   WBC 15.8 (H) 4.0 - 10.5 K/uL   RBC 3.44 (L) 3.87 - 5.11 MIL/uL   Hemoglobin 9.7 (L) 12.0 - 15.0 g/dL   HCT 91.431.8 (L) 78.236.0 - 95.646.0 %   MCV 92.4 78.0 - 100.0 fL   MCH 28.2 26.0 - 34.0 pg   MCHC 30.5 30.0 - 36.0  g/dL   RDW 21.322.2 (H) 08.611.5 - 57.815.5 %   Platelets 288 150 - 400 K/uL  Basic metabolic panel     Status: Abnormal   Collection Time: 03/17/16 11:57 AM  Result Value Ref Range   Sodium 144 135 - 145 mmol/L   Potassium 4.4 3.5 - 5.1 mmol/L   Chloride 115 (H) 101 - 111 mmol/L   CO2 24 22 - 32 mmol/L   Glucose, Bld 195 (H) 65 - 99 mg/dL   BUN 39 (H) 6 - 20 mg/dL   Creatinine, Ser 4.690.64 0.44 - 1.00 mg/dL   Calcium 7.8 (L) 8.9 - 10.3 mg/dL   GFR calc non Af Amer >60 >60 mL/min   GFR calc Af Amer >60 >60 mL/min   Anion gap 5 5 - 15  Glucose, capillary     Status: Abnormal   Collection Time: 03/17/16  3:34 PM  Result Value Ref Range   Glucose-Capillary 175 (H) 65 - 99 mg/dL  Glucose, capillary     Status: Abnormal   Collection Time: 03/17/16  8:07 PM  Result Value Ref Range   Glucose-Capillary 164 (H) 65 - 99 mg/dL  Glucose, capillary     Status: Abnormal   Collection Time: 03/17/16 11:49 PM  Result Value Ref Range   Glucose-Capillary 187 (H) 65 - 99 mg/dL  Glucose, capillary     Status: Abnormal   Collection Time: 03/18/16  4:01 AM  Result Value Ref Range   Glucose-Capillary 165 (H) 65 - 99 mg/dL  CBC     Status: Abnormal   Collection Time: 03/18/16  5:00 AM  Result Value Ref Range   WBC 16.2 (H) 4.0 - 10.5 K/uL   RBC 3.33 (L) 3.87 - 5.11 MIL/uL   Hemoglobin 9.4 (L) 12.0 - 15.0 g/dL   HCT 62.931.3 (L) 52.836.0 - 41.346.0 %   MCV 94.0 78.0 - 100.0 fL   MCH 28.2 26.0 - 34.0 pg   MCHC 30.0 30.0 - 36.0 g/dL   RDW 24.422.1 (H) 01.011.5 - 27.215.5 %   Platelets 318 150 - 400 K/uL  Basic metabolic panel  Status: Abnormal   Collection Time: 03/18/16  5:00 AM  Result Value Ref Range   Sodium 148 (H) 135 - 145 mmol/L   Potassium 4.0 3.5 - 5.1 mmol/L   Chloride 118 (H) 101 - 111 mmol/L   CO2 26 22 - 32 mmol/L   Glucose, Bld 130 (H) 65 - 99 mg/dL   BUN 37 (H) 6 - 20 mg/dL   Creatinine, Ser 1.610.62 0.44 - 1.00 mg/dL   Calcium 8.0 (L) 8.9 - 10.3 mg/dL   GFR calc non Af Amer >60 >60 mL/min   GFR calc Af Amer  >60 >60 mL/min   Anion gap 4 (L) 5 - 15  Glucose, capillary     Status: Abnormal   Collection Time: 03/18/16  8:21 AM  Result Value Ref Range   Glucose-Capillary 129 (H) 65 - 99 mg/dL   Comment 1 Notify RN    Comment 2 Document in Chart      Assessment/Plan:   NEURO  Altered Mental Status:  agitation, delirium and sedation   Plan: Not on formula for appropriate weaning yet  PULM  Acute Respiratory Failure (etiology unknown)  CXR looks volume overloaded   Plan: May respond to Lasix and nebulized therapy  CARDIO  Sinus Tachycardia   Plan: No specific treatment  RENAL  Hypernatremia moderate (146 - 155 meq/dl) Hypervolemia   Plan: 26 liters positive since admission.  GI  No specific problems   Plan: Continue tube feedings and tolerating them well at goal  ID  No known infectious sources   Plan: CPM  HEME  Anemia acute blood loss anemia)   Plan: Moderate anemia does not require transfusion.  ENDO No specific treatment.   Plan: CPM  Global Issues  Patient not weaning well and very agitated.  Will add some continuous sedation and respiratory treatments.    LOS: 11 days   Additional comments:I reviewed the patient's new clinical lab test results. cbc/bmet and I reviewed the patients new imaging test results. cxr  Critical Care Total Time*: 30 Minutes  Launi Asencio 03/18/2016  *Care during the described time interval was provided by me and/or other providers on the critical care team.  I have reviewed this patient's available data, including medical history, events of note, physical examination and test results as part of my evaluation.
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
## 2016-03-19 ENCOUNTER — Inpatient Hospital Stay (HOSPITAL_COMMUNITY): Payer: Medicare Other

## 2016-03-19 LAB — GLUCOSE, CAPILLARY
GLUCOSE-CAPILLARY: 101 mg/dL — AB (ref 65–99)
GLUCOSE-CAPILLARY: 129 mg/dL — AB (ref 65–99)
GLUCOSE-CAPILLARY: 140 mg/dL — AB (ref 65–99)
GLUCOSE-CAPILLARY: 148 mg/dL — AB (ref 65–99)
GLUCOSE-CAPILLARY: 149 mg/dL — AB (ref 65–99)
Glucose-Capillary: 119 mg/dL — ABNORMAL HIGH (ref 65–99)

## 2016-03-19 LAB — CBC WITH DIFFERENTIAL/PLATELET
BASOS ABS: 0 10*3/uL (ref 0.0–0.1)
Basophils Relative: 0 %
EOS ABS: 0.4 10*3/uL (ref 0.0–0.7)
Eosinophils Relative: 3 %
HCT: 29.5 % — ABNORMAL LOW (ref 36.0–46.0)
HEMOGLOBIN: 8.8 g/dL — AB (ref 12.0–15.0)
LYMPHS ABS: 0.9 10*3/uL (ref 0.7–4.0)
LYMPHS PCT: 7 %
MCH: 28.4 pg (ref 26.0–34.0)
MCHC: 29.8 g/dL — AB (ref 30.0–36.0)
MCV: 95.2 fL (ref 78.0–100.0)
Monocytes Absolute: 1.4 10*3/uL — ABNORMAL HIGH (ref 0.1–1.0)
Monocytes Relative: 11 %
NEUTROS ABS: 9.8 10*3/uL — AB (ref 1.7–7.7)
Neutrophils Relative %: 79 %
Platelets: 321 10*3/uL (ref 150–400)
RBC: 3.1 MIL/uL — ABNORMAL LOW (ref 3.87–5.11)
RDW: 23 % — AB (ref 11.5–15.5)
WBC: 12.5 10*3/uL — ABNORMAL HIGH (ref 4.0–10.5)

## 2016-03-19 LAB — BASIC METABOLIC PANEL
Anion gap: 3 — ABNORMAL LOW (ref 5–15)
BUN: 35 mg/dL — ABNORMAL HIGH (ref 6–20)
CALCIUM: 7.9 mg/dL — AB (ref 8.9–10.3)
CO2: 26 mmol/L (ref 22–32)
CREATININE: 0.56 mg/dL (ref 0.44–1.00)
Chloride: 118 mmol/L — ABNORMAL HIGH (ref 101–111)
GFR calc non Af Amer: >60 mL/min (ref >60)
Glucose, Bld: 129 mg/dL — ABNORMAL HIGH (ref 65–99)
Potassium: 3.6 mmol/L (ref 3.5–5.1)
SODIUM: 147 mmol/L — AB (ref 135–145)

## 2016-03-19 MED ORDER — ALBUMIN HUMAN 5 % IV SOLN
12.5000 g | Freq: Once | INTRAVENOUS | Status: AC
  Administered 2016-03-19: 12.5 g via INTRAVENOUS
  Filled 2016-03-19: qty 250

## 2016-03-19 MED ORDER — PHENYLEPHRINE HCL 10 MG/ML IJ SOLN
0.0000 ug/min | INTRAVENOUS | Status: DC
  Administered 2016-03-19 (×2): 120 ug/min via INTRAVENOUS
  Administered 2016-03-20: 50 ug/min via INTRAVENOUS
  Administered 2016-03-21 – 2016-03-22 (×3): 25 ug/min via INTRAVENOUS
  Administered 2016-03-23: 40 ug/min via INTRAVENOUS
  Administered 2016-03-23 – 2016-03-24 (×3): 85 ug/min via INTRAVENOUS
  Administered 2016-03-25: 73 ug/min via INTRAVENOUS
  Administered 2016-03-25: 110 ug/min via INTRAVENOUS
  Administered 2016-03-25 – 2016-03-26 (×2): 100 ug/min via INTRAVENOUS
  Administered 2016-03-26 (×2): 110 ug/min via INTRAVENOUS
  Administered 2016-03-27: 65 ug/min via INTRAVENOUS
  Administered 2016-03-27: 75 ug/min via INTRAVENOUS
  Administered 2016-03-29 – 2016-03-30 (×2): 50 ug/min via INTRAVENOUS
  Filled 2016-03-19 (×22): qty 4

## 2016-03-19 MED ORDER — ALBUMIN HUMAN 5 % IV SOLN
25.0000 g | Freq: Once | INTRAVENOUS | Status: AC
  Administered 2016-03-19: 25 g via INTRAVENOUS
  Filled 2016-03-19: qty 500

## 2016-03-19 MED ORDER — PHENYLEPHRINE HCL 10 MG/ML IJ SOLN
0.0000 ug/min | INTRAVENOUS | Status: DC
  Administered 2016-03-19: 150 ug/min via INTRAVENOUS
  Administered 2016-03-19: 45 ug/min via INTRAVENOUS
  Administered 2016-03-19: 140 ug/min via INTRAVENOUS
  Filled 2016-03-19 (×3): qty 1

## 2016-03-19 MED ORDER — SODIUM CHLORIDE 0.9 % IV BOLUS (SEPSIS)
500.0000 mL | Freq: Once | INTRAVENOUS | Status: AC
  Administered 2016-03-19: 500 mL via INTRAVENOUS

## 2016-03-19 MED ORDER — FREE WATER
200.0000 mL | Freq: Four times a day (QID) | Status: DC
  Administered 2016-03-19 – 2016-03-21 (×7): 200 mL

## 2016-03-19 NOTE — Progress Notes (Signed)
  Paged MD Regarding pressure of 77/56, orders received.
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
## 2016-03-19 NOTE — Progress Notes (Signed)
Pt remains on ventilator, though is weaning well today, per bedside nurse.  Pt continues on IV Rocephin for serratia PNA; albumin bolus this am for hypotension.  PT recommending SNF currently.  Consulted CSW to follow for SNF placement as pt progresses.  Hopeful for extubation next 1-2 days.    Quintella BatonJulie W. Amauria Younts, RN, BSN  Trauma/Neuro ICU Case Manager [phone]
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
## 2016-03-19 NOTE — Progress Notes (Signed)
Spoke with MD regarding pressure after bolus of 83/67 (74). Advised to continue monitoring.

## 2016-03-19 NOTE — Progress Notes (Signed)
Pt. completed albumin bolus, remains hypotensive. MD aware and instructed RN to begin Neo-synephrine drip.   -Gari CrownAshley Charlea Nardo, RN

## 2016-03-19 NOTE — Care Management Important Message (Signed)
Important Message  Patient Details  Name: [name] MRN: [id number] Date of Birth: [birth year]   Medicare Important Message Given:  Yes    [name] [name] 03/19/2016, 11:40 AM

## 2016-03-20 DIAGNOSIS — L8990 Pressure ulcer of unspecified site, unspecified stage: Secondary | ICD-10-CM | POA: Insufficient documentation

## 2016-03-20 DIAGNOSIS — I5023 Acute on chronic systolic (congestive) heart failure: Secondary | ICD-10-CM

## 2016-03-20 LAB — BASIC METABOLIC PANEL
Anion gap: 5 (ref 5–15)
BUN: 32 mg/dL — ABNORMAL HIGH (ref 6–20)
CHLORIDE: 112 mmol/L — AB (ref 101–111)
CO2: 25 mmol/L (ref 22–32)
CREATININE: 0.59 mg/dL (ref 0.44–1.00)
Calcium: 8.1 mg/dL — ABNORMAL LOW (ref 8.9–10.3)
GFR calc non Af Amer: >60 mL/min (ref >60)
GLUCOSE: 157 mg/dL — AB (ref 65–99)
Potassium: 4.3 mmol/L (ref 3.5–5.1)
Sodium: 142 mmol/L (ref 135–145)

## 2016-03-20 LAB — CBC
HEMATOCRIT: 30.9 % — AB (ref 36.0–46.0)
HEMOGLOBIN: 9.1 g/dL — AB (ref 12.0–15.0)
MCH: 28.7 pg (ref 26.0–34.0)
MCHC: 29.4 g/dL — AB (ref 30.0–36.0)
MCV: 97.5 fL (ref 78.0–100.0)
Platelets: 362 10*3/uL (ref 150–400)
RBC: 3.17 MIL/uL — ABNORMAL LOW (ref 3.87–5.11)
RDW: 23.4 % — AB (ref 11.5–15.5)
WBC: 11.3 10*3/uL — ABNORMAL HIGH (ref 4.0–10.5)

## 2016-03-20 LAB — CULTURE, BLOOD (ROUTINE X 2)
Culture: NO GROWTH
Culture: NO GROWTH

## 2016-03-20 LAB — GLUCOSE, CAPILLARY
GLUCOSE-CAPILLARY: 120 mg/dL — AB (ref 65–99)
GLUCOSE-CAPILLARY: 153 mg/dL — AB (ref 65–99)
Glucose-Capillary: 121 mg/dL — ABNORMAL HIGH (ref 65–99)
Glucose-Capillary: 124 mg/dL — ABNORMAL HIGH (ref 65–99)
Glucose-Capillary: 132 mg/dL — ABNORMAL HIGH (ref 65–99)
Glucose-Capillary: 136 mg/dL — ABNORMAL HIGH (ref 65–99)

## 2016-03-20 LAB — COOXEMETRY PANEL
CARBOXYHEMOGLOBIN: 2.4 % — AB (ref 0.5–1.5)
Methemoglobin: 0.8 % (ref 0.0–1.5)
O2 Saturation: 65.5 %
TOTAL HEMOGLOBIN: 9.3 g/dL — AB (ref 12.0–16.0)

## 2016-03-20 MED ORDER — FUROSEMIDE 10 MG/ML IJ SOLN
20.0000 mg | Freq: Once | INTRAMUSCULAR | Status: AC
  Administered 2016-03-20: 20 mg via INTRAVENOUS
  Filled 2016-03-20: qty 2

## 2016-03-20 NOTE — Progress Notes (Signed)
Subjective: Intubated  Sedated   Objective: Vitals:   03/20/16 0745 03/20/16 0800 03/20/16 0815 03/20/16 0830  BP: 98/71 (!) 84/63 91/71   Pulse: (!) 108 (!) 110 (!) 112 (!) 109  Resp: (!) 33 (!) 27 (!) 28 (!) 28  Temp:  (!) 100.5 F (38.1 C)    TempSrc:  Axillary    SpO2: 100% 95% 97% 96%  Weight:      Height:       Weight change: 0 lb (0 kg)  Intake/Output Summary (Last 24 hours) at 03/20/16 0912 Last data filed at 03/20/16 0800  Gross per 24 hour  Intake          4324.93 ml  Output             1810 ml  Net          2514.93 ml   I/O  Net positive 28.7 L    General: intubated and sedated   Neck:  Neck collar   Heart: Regular rate and rhythm, without murmurs, rubs, gallops.  Lungs: rales bilaterally   Exemities:  2+ edema.    Tele ;  ST     Lab Results: Results for orders placed or performed during the hospital encounter of 02/25/2016 (from the past 24 hour(s))  Glucose, capillary     Status: Abnormal   Collection Time: 03/19/16 12:27 PM  Result Value Ref Range   Glucose-Capillary 148 (H) 65 - 99 mg/dL   Comment 1 Notify RN    Comment 2 Document in Chart   Glucose, capillary     Status: Abnormal   Collection Time: 03/19/16  3:41 PM  Result Value Ref Range   Glucose-Capillary 129 (H) 65 - 99 mg/dL  Glucose, capillary     Status: Abnormal   Collection Time: 03/19/16  8:11 PM  Result Value Ref Range   Glucose-Capillary 119 (H) 65 - 99 mg/dL  Glucose, capillary     Status: Abnormal   Collection Time: 03/19/16 11:45 PM  Result Value Ref Range   Glucose-Capillary 149 (H) 65 - 99 mg/dL  Glucose, capillary     Status: Abnormal   Collection Time: 03/20/16  3:35 AM  Result Value Ref Range   Glucose-Capillary 132 (H) 65 - 99 mg/dL  CBC     Status: Abnormal   Collection Time: 03/20/16  5:31 AM  Result Value Ref Range   WBC 11.3 (H) 4.0 - 10.5 K/uL   RBC 3.17 (L) 3.87 - 5.11 MIL/uL   Hemoglobin 9.1 (L) 12.0 - 15.0 g/dL   HCT 16.130.9 (L) 09.636.0 - 04.546.0 %   MCV 97.5  78.0 - 100.0 fL   MCH 28.7 26.0 - 34.0 pg   MCHC 29.4 (L) 30.0 - 36.0 g/dL   RDW 40.923.4 (H) 81.111.5 - 91.415.5 %   Platelets 362 150 - 400 K/uL  Basic metabolic panel     Status: Abnormal   Collection Time: 03/20/16  5:31 AM  Result Value Ref Range   Sodium 142 135 - 145 mmol/L   Potassium 4.3 3.5 - 5.1 mmol/L   Chloride 112 (H) 101 - 111 mmol/L   CO2 25 22 - 32 mmol/L   Glucose, Bld 157 (H) 65 - 99 mg/dL   BUN 32 (H) 6 - 20 mg/dL   Creatinine, Ser 7.820.59 0.44 - 1.00 mg/dL   Calcium 8.1 (L) 8.9 - 10.3 mg/dL   GFR calc non Af Amer >60 >60 mL/min   GFR calc Af Amer >60 >  60 mL/min   Anion gap 5 5 - 15  Glucose, capillary     Status: Abnormal   Collection Time: 03/20/16  8:25 AM  Result Value Ref Range   Glucose-Capillary 124 (H) 65 - 99 mg/dL   Comment 1 Notify RN    Comment 2 Document in Chart     Studies/Results: No results found.  Medications:  @PROBHOSP @  1  Acute on chronic systolic CHF  Volume is up on eam  BP has limited diuresis   For now would check [place]   Follow BP  May dose with lasix later today .    LOS: 13 days   Dietrich Patesaula Shaheer Bonfield 03/20/2016, 9:12 AM
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
## 2016-03-20 DEATH — deceased

## 2016-03-21 ENCOUNTER — Inpatient Hospital Stay (HOSPITAL_COMMUNITY): Payer: Medicare Other

## 2016-03-21 LAB — GLUCOSE, CAPILLARY
Glucose-Capillary: 109 mg/dL — ABNORMAL HIGH (ref 65–99)
Glucose-Capillary: 125 mg/dL — ABNORMAL HIGH (ref 65–99)
Glucose-Capillary: 117 mg/dL — ABNORMAL HIGH (ref 65–99)
Glucose-Capillary: 95 mg/dL (ref 65–99)
Glucose-Capillary: 128 mg/dL — ABNORMAL HIGH (ref 65–99)

## 2016-03-21 LAB — COMPREHENSIVE METABOLIC PANEL
ALK PHOS: 93 U/L (ref 38–126)
ALT: 48 U/L (ref 14–54)
AST: 45 U/L — AB (ref 15–41)
Albumin: 2.3 g/dL — ABNORMAL LOW (ref 3.5–5.0)
Anion gap: 3 — ABNORMAL LOW (ref 5–15)
BUN: 32 mg/dL — AB (ref 6–20)
CALCIUM: 7.9 mg/dL — AB (ref 8.9–10.3)
CHLORIDE: 110 mmol/L (ref 101–111)
CO2: 26 mmol/L (ref 22–32)
Creatinine, Ser: 0.51 mg/dL (ref 0.44–1.00)
GFR calc Af Amer: >60 mL/min (ref >60)
GFR calc non Af Amer: >60 mL/min (ref >60)
GLUCOSE: 112 mg/dL — AB (ref 65–99)
Potassium: 4 mmol/L (ref 3.5–5.1)
SODIUM: 139 mmol/L (ref 135–145)
Total Bilirubin: 0.7 mg/dL (ref 0.3–1.2)
Total Protein: 5.3 g/dL — ABNORMAL LOW (ref 6.5–8.1)

## 2016-03-21 LAB — POCT I-STAT 3, ART BLOOD GAS (G3+)
ACID-BASE DEFICIT: 1 mmol/L (ref 0.0–2.0)
BICARBONATE: 23.6 mmol/L (ref 20.0–28.0)
O2 Saturation: 96 %
TCO2: 25 mmol/L (ref 0–100)
pCO2 arterial: 37.3 mmHg (ref 32.0–48.0)
pH, Arterial: 7.411 (ref 7.350–7.450)
pO2, Arterial: 80 mmHg — ABNORMAL LOW (ref 83.0–108.0)

## 2016-03-21 LAB — BASIC METABOLIC PANEL
Anion gap: 4 — ABNORMAL LOW (ref 5–15)
BUN: 31 mg/dL — AB (ref 6–20)
CALCIUM: 7.5 mg/dL — AB (ref 8.9–10.3)
CO2: 24 mmol/L (ref 22–32)
CREATININE: 0.5 mg/dL (ref 0.44–1.00)
Chloride: 109 mmol/L (ref 101–111)
Glucose, Bld: 283 mg/dL — ABNORMAL HIGH (ref 65–99)
Potassium: 4.9 mmol/L (ref 3.5–5.1)
SODIUM: 137 mmol/L (ref 135–145)

## 2016-03-21 LAB — COOXEMETRY PANEL
Carboxyhemoglobin: 2.9 % — ABNORMAL HIGH (ref 0.5–1.5)
METHEMOGLOBIN: 0.6 % (ref 0.0–1.5)
O2 Saturation: 96.9 %
Total hemoglobin: 9.2 g/dL — ABNORMAL LOW (ref 12.0–16.0)

## 2016-03-21 MED ORDER — CHLORHEXIDINE GLUCONATE 0.12 % MT SOLN
OROMUCOSAL | Status: AC
  Administered 2016-03-21: 15 mL
  Filled 2016-03-21: qty 15

## 2016-03-21 MED ORDER — PIVOT 1.5 CAL PO LIQD
1000.0000 mL | ORAL | Status: DC
  Administered 2016-03-21 – 2016-04-03 (×13): 1000 mL
  Filled 2016-03-21 (×4): qty 1000

## 2016-03-21 MED ORDER — FUROSEMIDE 10 MG/ML IJ SOLN
20.0000 mg | Freq: Once | INTRAMUSCULAR | Status: AC
  Administered 2016-03-21: 20 mg via INTRAVENOUS
  Filled 2016-03-21: qty 2

## 2016-03-21 NOTE — Progress Notes (Signed)
Patient returned to full support per increased WOB. Patient currently more comfortable. RT will continue to monitor patient.
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
## 2016-03-21 NOTE — Progress Notes (Signed)
Subjective: Pt intubated  Eyes open   Objective: Vitals:   03/21/16 0800 03/21/16 0820 03/21/16 0900 03/21/16 1000  BP: 98/67 (!) 92/58 101/72 100/74  Pulse: (!) 102 (!) 101 (!) 102 99  Resp: (!) 27 (!) 29 (!) 31 (!) 28  Temp:  (!) 100.5 F (38.1 C)    TempSrc:  Oral    SpO2: 96% 95% 96% 94%  Weight:      Height:       Weight change: 3 lb 1.4 oz (1.4 kg)  Intake/Output Summary (Last 24 hours) at 03/21/16 1044 Last data filed at 03/21/16 1000  Gross per 24 hour  Intake          2827.02 ml  Output             2205 ml  Net           622.02 ml    General: Intubated  Neck:  C collar    Heart: Regular rate and rhythm, without murmurs, rubs, gallops.  Lungs: Coarse BS  Exemities:  2+ edema.     Tele SR/ST  100s   Lab Results: Results for orders placed or performed during the hospital encounter of 03/12/2016 (from the past 24 hour(s))  Glucose, capillary     Status: Abnormal   Collection Time: 03/20/16 11:54 AM  Result Value Ref Range   Glucose-Capillary 136 (H) 65 - 99 mg/dL   Comment 1 Notify RN    Comment 2 Document in Chart   Glucose, capillary     Status: Abnormal   Collection Time: 03/20/16  3:51 PM  Result Value Ref Range   Glucose-Capillary 120 (H) 65 - 99 mg/dL   Comment 1 Notify RN    Comment 2 Document in Chart   Glucose, capillary     Status: Abnormal   Collection Time: 03/20/16  8:04 PM  Result Value Ref Range   Glucose-Capillary 153 (H) 65 - 99 mg/dL  Glucose, capillary     Status: Abnormal   Collection Time: 03/20/16 11:32 PM  Result Value Ref Range   Glucose-Capillary 121 (H) 65 - 99 mg/dL  I-STAT 3, arterial blood gas (G3+)     Status: Abnormal   Collection Time: 03/21/16  2:51 AM  Result Value Ref Range   pH, Arterial 7.411 7.350 - 7.450   pCO2 arterial 37.3 32.0 - 48.0 mmHg   pO2, Arterial 80.0 (L) 83.0 - 108.0 mmHg   Bicarbonate 23.6 20.0 - 28.0 mmol/L   TCO2 25 0 - 100 mmol/L   O2 Saturation 96.0 %   Acid-base deficit 1.0 0.0 - 2.0 mmol/L    Patient temperature 99.5 F    Collection site RADIAL, ALLEN'S TEST ACCEPTABLE    Drawn by RT    Sample type ARTERIAL   Glucose, capillary     Status: Abnormal   Collection Time: 03/21/16  3:52 AM  Result Value Ref Range   Glucose-Capillary 109 (H) 65 - 99 mg/dL  Basic metabolic panel     Status: Abnormal   Collection Time: 03/21/16  4:10 AM  Result Value Ref Range   Sodium 137 135 - 145 mmol/L   Potassium 4.9 3.5 - 5.1 mmol/L   Chloride 109 101 - 111 mmol/L   CO2 24 22 - 32 mmol/L   Glucose, Bld 283 (H) 65 - 99 mg/dL   BUN 31 (H) 6 - 20 mg/dL   Creatinine, Ser 1.610.50 0.44 - 1.00 mg/dL   Calcium 7.5 (L) 8.9 -  10.3 mg/dL   GFR calc non Af Amer >60 >60 mL/min   GFR calc Af Amer >60 >60 mL/min   Anion gap 4 (L) 5 - 15  Glucose, capillary     Status: Abnormal   Collection Time: 03/21/16  8:22 AM  Result Value Ref Range   Glucose-Capillary 125 (H) 65 - 99 mg/dL    Studies/Results: Dg Chest Port 1 View  Result Date: 03/21/2016 CLINICAL DATA:  Intubation, ventilator, hypertension, coronary artery disease EXAM: PORTABLE CHEST 1 VIEW COMPARISON:  Portable exam 0635 hours compared to 03/19/2016 FINDINGS: Tip of endotracheal tube projects 5.2 cm above carina. Nasogastric tube extends into stomach. RIGHT arm PICC line tip projects over SVC. Enlargement of cardiac silhouette with pulmonary vascular congestion. Atherosclerotic calcification aorta. Persistent pulmonary infiltrates compatible pulmonary edema. Small RIGHT pleural effusion and RIGHT basilar atelectasis. No pneumothorax. Bones demineralized. RIGHT basilar pulmonary nodule seen on the previous exam is not visualized. IMPRESSION: Persistent pulmonary edema, RIGHT basilar atelectasis and small RIGHT pleural effusion. Little interval change. Aortic atherosclerosis. Electronically Signed   By: Ulyses SouthwardMark  Boles M.D.   On: 03/21/2016 07:51    Medications:REviewed    @PROBHOSP @  1  Acute on chronic systolic / diastolic CHF  Pt with  anasarca REceiving 20 mg lasix today  If there is not much response I would challenge with higher dose of lasix 40 or 80 .  Aware that BP has been tenuous   LOS: 14 days   Dietrich PatesPaula Jaquaveon Bilal 03/21/2016, 10:44 AM

## 2016-03-22 ENCOUNTER — Inpatient Hospital Stay (HOSPITAL_COMMUNITY): Payer: Medicare Other

## 2016-03-22 LAB — BASIC METABOLIC PANEL
Anion gap: 5 (ref 5–15)
BUN: 34 mg/dL — AB (ref 6–20)
CHLORIDE: 106 mmol/L (ref 101–111)
CO2: 26 mmol/L (ref 22–32)
CREATININE: 0.46 mg/dL (ref 0.44–1.00)
Calcium: 7.8 mg/dL — ABNORMAL LOW (ref 8.9–10.3)
GFR calc Af Amer: >60 mL/min (ref >60)
GFR calc non Af Amer: >60 mL/min (ref >60)
Glucose, Bld: 157 mg/dL — ABNORMAL HIGH (ref 65–99)
POTASSIUM: 3.9 mmol/L (ref 3.5–5.1)
SODIUM: 137 mmol/L (ref 135–145)

## 2016-03-22 LAB — GLUCOSE, CAPILLARY
GLUCOSE-CAPILLARY: 105 mg/dL — AB (ref 65–99)
GLUCOSE-CAPILLARY: 107 mg/dL — AB (ref 65–99)
GLUCOSE-CAPILLARY: 121 mg/dL — AB (ref 65–99)
GLUCOSE-CAPILLARY: 157 mg/dL — AB (ref 65–99)
Glucose-Capillary: 83 mg/dL (ref 65–99)
Glucose-Capillary: 105 mg/dL — ABNORMAL HIGH (ref 65–99)
Glucose-Capillary: 103 mg/dL — ABNORMAL HIGH (ref 65–99)

## 2016-03-22 MED ORDER — FUROSEMIDE 10 MG/ML IJ SOLN
40.0000 mg | Freq: Once | INTRAMUSCULAR | Status: AC
  Administered 2016-03-22: 40 mg via INTRAVENOUS
  Filled 2016-03-22: qty 4

## 2016-03-22 NOTE — Care Management Important Message (Signed)
Important Message  Patient Details  Name: [name] MRN: [id number] Date of Birth: 11-05-1950   Medicare Important Message Given:  Yes    [name] 03/22/2016, 10:31 AM

## 2016-03-22 NOTE — Progress Notes (Signed)
Patient ID: [name], female   DOB: 1950-08-20, 65 y.o.   MRN: [id number] I met with her son, daughter, and granddaughter at the bedside. I reviewed her current issues and we discussed goals of care. We discussed trach/PEG again. They do not want her to go to a nursing home. That is the best case scenario at this point and we discussed that. We also discussed extubation/comfort care. For now, we will continue aggressive care. They are going to talk it over.  She is still not following commands well. WIll check repeat CT head early tomorrow AM.  Georganna Skeans, MD, MPH, FACS Trauma: [phone] General Surgery: [phone]
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
## 2016-03-22 NOTE — Progress Notes (Signed)
Subjective: Opens eyes    Objective: Vitals:   03/22/16 0500 03/22/16 0600 03/22/16 0700 03/22/16 0754  BP: (!) 89/63 (!) 89/61 (!) 85/61 (!) 86/63  Pulse: 94 88 83 91  Resp: (!) 27 (!) 24 (!) 21 16  Temp:      TempSrc:      SpO2: 96% 96% 97% 95%  Weight:      Height:       Weight change: 2 lb 10.3 oz (1.2 kg)  Intake/Output Summary (Last 24 hours) at 03/22/16 1018 Last data filed at 03/22/16 0700  Gross per 24 hour  Intake          1226.25 ml  Output             1825 ml  Net          -598.75 ml    General: Opens eyes   Neck:  C Collar Heart: Regular rate and rhythm, without murmurs, rubs, gallops.  Lungs: Clear to auscultation.  No rales or wheezes. Exemities:  2+ edema.   Neuro: Opening eyes  Some responses   Lab Results: Results for orders placed or performed during the hospital encounter of 02/23/2016 (from the past 24 hour(s))  .Cooxemetry Panel (carboxy, met, total hgb, O2 sat)     Status: Abnormal   Collection Time: 03/21/16 11:30 AM  Result Value Ref Range   Total hemoglobin 9.2 (L) 12.0 - 16.0 g/dL   O2 Saturation 96.9 %   Carboxyhemoglobin 2.9 (H) 0.5 - 1.5 %   Methemoglobin 0.6 0.0 - 1.5 %  Comprehensive metabolic panel     Status: Abnormal   Collection Time: 03/21/16 11:30 AM  Result Value Ref Range   Sodium 139 135 - 145 mmol/L   Potassium 4.0 3.5 - 5.1 mmol/L   Chloride 110 101 - 111 mmol/L   CO2 26 22 - 32 mmol/L   Glucose, Bld 112 (H) 65 - 99 mg/dL   BUN 32 (H) 6 - 20 mg/dL   Creatinine, Ser 0.51 0.44 - 1.00 mg/dL   Calcium 7.9 (L) 8.9 - 10.3 mg/dL   Total Protein 5.3 (L) 6.5 - 8.1 g/dL   Albumin 2.3 (L) 3.5 - 5.0 g/dL   AST 45 (H) 15 - 41 U/L   ALT 48 14 - 54 U/L   Alkaline Phosphatase 93 38 - 126 U/L   Total Bilirubin 0.7 0.3 - 1.2 mg/dL   GFR calc non Af Amer >60 >60 mL/min   GFR calc Af Amer >60 >60 mL/min   Anion gap 3 (L) 5 - 15  Glucose, capillary     Status: Abnormal   Collection Time: 03/21/16 11:54 AM  Result Value Ref Range   Glucose-Capillary 117 (H) 65 - 99 mg/dL  Glucose, capillary     Status: None   Collection Time: 03/21/16  4:31 PM  Result Value Ref Range   Glucose-Capillary 95 65 - 99 mg/dL  Glucose, capillary     Status: Abnormal   Collection Time: 03/21/16  8:01 PM  Result Value Ref Range   Glucose-Capillary 128 (H) 65 - 99 mg/dL  Glucose, capillary     Status: Abnormal   Collection Time: 03/22/16 12:30 AM  Result Value Ref Range   Glucose-Capillary 107 (H) 65 - 99 mg/dL   Comment 1 Notify RN   Basic metabolic panel     Status: Abnormal   Collection Time: 03/22/16  4:49 AM  Result Value Ref Range   Sodium 137 135 -  145 mmol/L   Potassium 3.9 3.5 - 5.1 mmol/L   Chloride 106 101 - 111 mmol/L   CO2 26 22 - 32 mmol/L   Glucose, Bld 157 (H) 65 - 99 mg/dL   BUN 34 (H) 6 - 20 mg/dL   Creatinine, Ser 0.46 0.44 - 1.00 mg/dL   Calcium 7.8 (L) 8.9 - 10.3 mg/dL   GFR calc non Af Amer >60 >60 mL/min   GFR calc Af Amer >60 >60 mL/min   Anion gap 5 5 - 15  Glucose, capillary     Status: Abnormal   Collection Time: 03/22/16  4:52 AM  Result Value Ref Range   Glucose-Capillary 157 (H) 65 - 99 mg/dL   Comment 1 Notify RN   Glucose, capillary     Status: None   Collection Time: 03/22/16  8:44 AM  Result Value Ref Range   Glucose-Capillary 83 65 - 99 mg/dL    Studies/Results: Dg Chest Port 1 View  Result Date: 03/22/2016 CLINICAL DATA:  Vent intubation EXAM: PORTABLE CHEST 1 VIEW COMPARISON:  03/21/2016 FINDINGS: Endotracheal tube, NG tube and central venous line unchanged. Stable cardiac silhouette. Bilateral pleural effusions RIGHT greater than LEFT. Changed. Bibasilar atelectasis. No pneumothorax. IMPRESSION: 1. Stable support apparatus. 2. RIGHT effusion is slightly increased. 3. Bibasilar atelectasis. Electronically Signed   By: Suzy Bouchard M.D.   On: 03/22/2016 07:41    Medications: REviewed    '@PROBHOSP'$ @  1  Acute on chronic systolic / diastolic CHF  Pt receiving lasix 40 IV now  Watch  response  Check Coox    BP remains tenuous    LOS: 15 days   Dorris Carnes 03/22/2016, 10:18 AM

## 2016-03-22 NOTE — Progress Notes (Signed)
OT Cancellation Note  Patient Details Name: Darl HouseholderBrenda A Bello MRN: [id number] DOB: Dec 11, 1950   Cancelled Treatment:    Reason Eval/Treat Not Completed: Medical issues which prohibited therapy. Per RN; pt not medically appropriate for therapy today. Will hold off for OT today and follow up as time allows.  Gaye AlkenBailey A Prather Failla M.S., OTR/L Pager: [phone]  03/22/2016, 1:51 PM

## 2016-03-23 ENCOUNTER — Inpatient Hospital Stay (HOSPITAL_COMMUNITY): Payer: Medicare Other

## 2016-03-23 DIAGNOSIS — I5021 Acute systolic (congestive) heart failure: Secondary | ICD-10-CM

## 2016-03-23 LAB — BASIC METABOLIC PANEL
ANION GAP: 5 (ref 5–15)
BUN: 32 mg/dL — ABNORMAL HIGH (ref 6–20)
CALCIUM: 7.9 mg/dL — AB (ref 8.9–10.3)
CHLORIDE: 107 mmol/L (ref 101–111)
CO2: 28 mmol/L (ref 22–32)
Creatinine, Ser: 0.48 mg/dL (ref 0.44–1.00)
GFR calc non Af Amer: >60 mL/min (ref >60)
GLUCOSE: 109 mg/dL — AB (ref 65–99)
Potassium: 3.7 mmol/L (ref 3.5–5.1)
Sodium: 140 mmol/L (ref 135–145)

## 2016-03-23 LAB — GLUCOSE, CAPILLARY
GLUCOSE-CAPILLARY: 95 mg/dL (ref 65–99)
GLUCOSE-CAPILLARY: 121 mg/dL — AB (ref 65–99)
GLUCOSE-CAPILLARY: 129 mg/dL — AB (ref 65–99)
Glucose-Capillary: 108 mg/dL — ABNORMAL HIGH (ref 65–99)
Glucose-Capillary: 114 mg/dL — ABNORMAL HIGH (ref 65–99)
Glucose-Capillary: 144 mg/dL — ABNORMAL HIGH (ref 65–99)

## 2016-03-23 LAB — CBC
HEMATOCRIT: 30.6 % — AB (ref 36.0–46.0)
HEMOGLOBIN: 9.3 g/dL — AB (ref 12.0–15.0)
MCH: 29.3 pg (ref 26.0–34.0)
MCHC: 30.4 g/dL (ref 30.0–36.0)
MCV: 96.5 fL (ref 78.0–100.0)
Platelets: 397 10*3/uL (ref 150–400)
RBC: 3.17 MIL/uL — ABNORMAL LOW (ref 3.87–5.11)
RDW: 23.3 % — AB (ref 11.5–15.5)
WBC: 8.5 10*3/uL (ref 4.0–10.5)

## 2016-03-23 MED ORDER — FUROSEMIDE 10 MG/ML IJ SOLN
40.0000 mg | Freq: Once | INTRAMUSCULAR | Status: AC
  Administered 2016-03-23: 40 mg via INTRAVENOUS
  Filled 2016-03-23: qty 4

## 2016-03-23 MED ORDER — ENOXAPARIN SODIUM 30 MG/0.3ML ~~LOC~~ SOLN
30.0000 mg | Freq: Two times a day (BID) | SUBCUTANEOUS | Status: DC
  Administered 2016-03-23 – 2016-04-03 (×22): 30 mg via SUBCUTANEOUS
  Filled 2016-03-23 (×23): qty 0.3
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
## 2016-03-23 NOTE — Progress Notes (Signed)
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   On: 03/17/2016 22:51   Dg Femur, Min 2 Views Right  Result Date: 03/14/2016 CLINICAL DATA:  ORIF right femur EXAM: DG C-ARM 61-120 MIN; RIGHT FEMUR 2 VIEWS COMPARISON:  03/12/2016 FINDINGS: Total fluoroscopy time was 2 minutes 37 seconds. Six low resolution intraoperative images of the right femur are submitted. There is a partially visualized intra medullary rod and fixating screw in the proximal femur. Images were obtained during intraoperative  surgical plate and screw fixation of a comminuted, impacted distal femoral fracture. IMPRESSION: Intraoperative fluoroscopy provided during internal fixation of distal right femoral fracture. Electronically Signed   By: Donavan Foil M.D.   On: 03/14/2016 03:40   Dg Femur, Min 2 Views Right  Result Date: 03/18/2016 CLINICAL DATA:  ORIF right femur fracture EXAM: RIGHT FEMUR 2 VIEWS; DG C-ARM 61-120 MIN COMPARISON:  None FLUOROSCOPY TIME:  2 minutes 22 seconds FINDINGS: Right intertrochanteric fracture transfixed with a intramedullary nail and cannulated interlocking femoral neck screw without failure or complication. IMPRESSION: Interval ORIF right intertrochanteric fracture. Electronically Signed   By: Kathreen Devoid   On: 02/28/2016 17:15   Dg Femur Port, Min 2 Views Right  Result Date: 03/14/2016 CLINICAL DATA:  Femur fracture. EXAM: RIGHT FEMUR PORTABLE 2 VIEW COMPARISON:  Multiple priors. FINDINGS: The patient is status post open reduction internal fixation distal femur fracture. Satisfactory position and alignment. Previous ORIF for intertrochanteric fracture. IMPRESSION: Satisfactory postoperative appearance. Electronically Signed   By: Staci Righter M.D.   On: 03/14/2016 08:23    Assessment & Plan    ACUTE ON CHRONIC SYSTOLIC AND DIASTOLIC HF:  EF 30 - 35%.  Down 1.2 liters yesterday.  Up 18 since admission.   However, her pressure is down.  She is on increased phenylephrine.   Likely will not tolerate much diuresis at this time.    Signed, Minus Breeding, MD  03/23/2016, 12:36 PM

## 2016-03-24 ENCOUNTER — Inpatient Hospital Stay (HOSPITAL_COMMUNITY): Payer: Medicare Other

## 2016-03-24 LAB — GLUCOSE, CAPILLARY
GLUCOSE-CAPILLARY: 117 mg/dL — AB (ref 65–99)
GLUCOSE-CAPILLARY: 141 mg/dL — AB (ref 65–99)
GLUCOSE-CAPILLARY: 135 mg/dL — AB (ref 65–99)
Glucose-Capillary: 129 mg/dL — ABNORMAL HIGH (ref 65–99)
Glucose-Capillary: 126 mg/dL — ABNORMAL HIGH (ref 65–99)

## 2016-03-24 NOTE — Progress Notes (Signed)
Breath sounds improved to less coarse and sats increase with suctioning. Pt is stable at this time no distress noted. Neb given through aerogen

## 2016-03-24 NOTE — Progress Notes (Signed)
RT advanced patients ETT 2cm per MD order. No complications. Vital signs stable at this time. Patient tolerated well. RT will continue to monitor.
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
## 2016-03-24 NOTE — Progress Notes (Signed)
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 There is an acute, closed, comminuted intratrochanteric fracture of the proximal femur with avulsed greater trochanter. There is medial displacement of the femoral shaft approximately 1/2 shaft width. There is resultant varus angulation of the right proximal femur. No lateral view available. Femoral head is seated within the acetabular component. The pubic rami appear intact. The ileum and sacroiliac joints are normal. Contrast is seen partially filling the bladder  normal distal right ureter. IMPRESSION: Acute, closed, medially displaced, comminuted intratrochanteric fracture of the right proximal femur with varus angulation. Avulsed greater trochanter. Electronically Signed   By: Ashley Royalty M.D.   On: 02/26/2016 22:51   Dg Femur, Min 2 Views Right  Result Date: 03/14/2016 CLINICAL DATA:  ORIF right femur EXAM: DG C-ARM 61-120 MIN; RIGHT FEMUR 2 VIEWS COMPARISON:  02/20/2016 FINDINGS: Total fluoroscopy time was 2 minutes 37 seconds. Six low resolution intraoperative images of the right femur are submitted. There is a partially visualized intra medullary rod and fixating screw in the proximal femur. Images were obtained during intraoperative surgical plate and screw fixation of a comminuted, impacted distal femoral fracture. IMPRESSION: Intraoperative fluoroscopy provided during internal fixation of distal right femoral fracture. Electronically Signed   By: Donavan Foil M.D.   On: 03/14/2016 03:40   Dg Femur, Min 2 Views Right  Result Date: 03/05/2016 CLINICAL DATA:  ORIF right femur fracture EXAM: RIGHT FEMUR 2 VIEWS; DG C-ARM 61-120 MIN COMPARISON:  None FLUOROSCOPY TIME:  2 minutes 22 seconds FINDINGS: Right intertrochanteric fracture transfixed with a intramedullary nail and cannulated interlocking femoral neck screw without failure or complication. IMPRESSION: Interval ORIF right intertrochanteric fracture. Electronically Signed   By: Kathreen Devoid   On: 03/19/2016 17:15   Dg Femur Port, Min 2 Views Right  Result Date: 03/14/2016 CLINICAL DATA:  Femur fracture. EXAM: RIGHT FEMUR PORTABLE 2 VIEW COMPARISON:  Multiple priors. FINDINGS: The patient is status post open reduction internal fixation distal femur fracture. Satisfactory position and alignment. Previous ORIF for intertrochanteric fracture. IMPRESSION: Satisfactory postoperative appearance. Electronically Signed   By: Staci Righter M.D.   On: 03/14/2016 08:23    Assessment & Plan    ACUTE ON  CHRONIC SYSTOLIC AND DIASTOLIC HF:  EF 30 - 37%.  Down 0.5 liters yesterday after 40 mg x 1 of Lasix.Marland Kitchen  Up 16 since admission.   However, her pressure is down.  She is on phenylephrine and dropped her pressure after Lasix  yesterday.   Likely will not tolerate much diuresis at this time.      Signed, Minus Breeding, MD  03/24/2016, 9:24 AM

## 2016-03-25 DIAGNOSIS — I429 Cardiomyopathy, unspecified: Secondary | ICD-10-CM

## 2016-03-25 LAB — CBC
HEMATOCRIT: 31.5 % — AB (ref 36.0–46.0)
HEMOGLOBIN: 9.3 g/dL — AB (ref 12.0–15.0)
MCH: 28.4 pg (ref 26.0–34.0)
MCHC: 29.5 g/dL — AB (ref 30.0–36.0)
MCV: 96 fL (ref 78.0–100.0)
Platelets: 412 10*3/uL — ABNORMAL HIGH (ref 150–400)
RBC: 3.28 MIL/uL — ABNORMAL LOW (ref 3.87–5.11)
RDW: 22.5 % — ABNORMAL HIGH (ref 11.5–15.5)
WBC: 9.7 10*3/uL (ref 4.0–10.5)

## 2016-03-25 LAB — GLUCOSE, CAPILLARY
GLUCOSE-CAPILLARY: 149 mg/dL — AB (ref 65–99)
Glucose-Capillary: 110 mg/dL — ABNORMAL HIGH (ref 65–99)
Glucose-Capillary: 112 mg/dL — ABNORMAL HIGH (ref 65–99)
Glucose-Capillary: 119 mg/dL — ABNORMAL HIGH (ref 65–99)
Glucose-Capillary: 125 mg/dL — ABNORMAL HIGH (ref 65–99)
Glucose-Capillary: 86 mg/dL (ref 65–99)

## 2016-03-25 MED ORDER — FUROSEMIDE 10 MG/ML IJ SOLN
40.0000 mg | Freq: Once | INTRAMUSCULAR | Status: AC
  Administered 2016-03-25: 40 mg via INTRAVENOUS
  Filled 2016-03-25: qty 4

## 2016-03-25 MED ORDER — SODIUM CHLORIDE 0.9 % IV SOLN: INTRAVENOUS | Status: DC | PRN

## 2016-03-25 MED ORDER — ALBUMIN HUMAN 5 % IV SOLN
12.5000 g | Freq: Once | INTRAVENOUS | Status: AC
  Administered 2016-03-25: 12.5 g via INTRAVENOUS
  Filled 2016-03-25: qty 250

## 2016-03-25 NOTE — Procedures (Signed)
Arterial Catheter Insertion Procedure Note Darl HouseholderBrenda A Karr [id number] Jan 25, 1951  Procedure: Insertion of Arterial Catheter  Indications: Blood pressure monitoring and Frequent blood sampling  Procedure Details Consent: Unable to obtain consent because of patient on ventilator. Time Out: Verified patient identification, verified procedure, site/side was marked, verified correct patient position, special equipment/implants available, medications/allergies/relevent history reviewed, required imaging and test results available.  Performed  Maximum sterile technique was used including antiseptics, cap, gloves, gown, hand hygiene, mask and sheet. Skin prep: Chlorhexidine; local anesthetic administered 20 gauge catheter was inserted into left radial artery using the Seldinger technique.  Evaluation Blood flow good; BP tracing good. Complications: No apparent complications.   Durwin GlazeBrown, Niel Peretti N 03/25/2016
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
## 2016-03-25 NOTE — Clinical Social Work Note (Signed)
CSW continues to follow to provide support to patient and family. CSW notes that the family does not want to pursue SNF placement at this time.   Roddie McBryant Jamichael Knotts MSW, [place] Park[place]CSW, [place], [phone]

## 2016-03-25 NOTE — Care Management Important Message (Signed)
Important Message  Patient Details  Name: [name] MRN: [id number] Date of Birth: Apr 26, 1951   Medicare Important Message Given:  Other (see comment)    [name] 03/25/2016, 12:04 PM
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
## 2016-03-25 NOTE — Progress Notes (Signed)
Follow up - Trauma and Critical Care  Patient Details:    [name] is an 65 y.o. female.  Lines/tubes : Airway 7.5 mm (Active)  Secured at (cm) 21 cm 03/25/2016  7:59 AM  Measured From Lips 03/25/2016  7:59 AM  Secured Location Left 03/25/2016  7:59 AM  Secured By [employer] Tube Holder 03/25/2016  7:59 AM  Tube Holder Repositioned Yes 03/25/2016  7:59 AM  Cuff Pressure (cm H2O) 24 cm H2O 03/24/2016  3:18 PM  Site Condition Dry 03/25/2016  7:59 AM     PICC Double Lumen 03/06/2016 PICC Right Basilic 41 cm 3 cm (Active)  Indication for Insertion or Continuance of Line Prolonged intravenous therapies 03/25/2016  7:01 AM  Exposed Catheter (cm) 3 cm 02/27/2016  9:00 AM  Site Assessment Clean;Dry;Intact 03/24/2016  8:00 PM  Lumen #1 Status Infusing;Flushed;Blood return noted 03/24/2016  8:00 PM  Lumen #2 Status Flushed;Blood return noted;Saline locked 03/24/2016  8:00 PM  Dressing Type Transparent;Occlusive 03/24/2016  8:00 PM  Dressing Status Clean;Dry;Intact;Antimicrobial disc in place 03/24/2016  8:00 PM  Line Care Connections checked and tightened;Zeroed and calibrated;Leveled 03/24/2016  8:00 PM  Line Adjustment (NICU/IV Team Only) No 03/16/2016  8:00 PM  Dressing Intervention Dressing changed;Antimicrobial disc changed 03/19/2016 12:00 AM  Dressing Change Due 03/25/16 03/24/2016  8:00 PM     Urethral Catheter Jasmine StanleyLisa W. Double-lumen 14 Fr. (Active)  Indication for Insertion or Continuance of Catheter Aggressive IV diuresis 03/25/2016  7:01 AM  Site Assessment Clean;Intact 03/24/2016  8:00 PM  Catheter Maintenance Bag below level of bladder;Catheter secured;Drainage bag/tubing not touching floor;Insertion date on drainage bag;No dependent loops;Seal intact 03/25/2016  7:01 AM  Collection Container Standard drainage bag 03/24/2016  8:00 PM  Securement Method Securing device (Describe) 03/24/2016  8:00 PM  Urinary Catheter Interventions Unclamped 03/23/2016  8:00 PM  Output (mL) 125 mL 03/25/2016   6:00 AM    Microbiology/Sepsis markers: Results for orders placed or performed during the hospital encounter of 03/17/2016  MRSA PCR Screening     Status: None   Collection Time: 03/08/16 12:31 AM  Result Value Ref Range Status   MRSA by PCR NEGATIVE NEGATIVE Final    Comment:        The GeneXpert MRSA Assay (FDA approved for NASAL specimens only), is one component of a comprehensive MRSA colonization surveillance program. It is not intended to diagnose MRSA infection nor to guide or monitor treatment for MRSA infections.   Culture, Urine     Status: None   Collection Time: 03/15/16  8:30 AM  Result Value Ref Range Status   Specimen Description URINE, CATHETERIZED  Final   Special Requests NONE  Final   Culture NO GROWTH  Final   Report Status 03/16/2016 FINAL  Final  Culture, respiratory (NON-Expectorated)     Status: None   Collection Time: 03/15/16  8:37 AM  Result Value Ref Range Status   Specimen Description TRACHEAL ASPIRATE  Final   Special Requests NONE  Final   Gram Stain   Final    RARE WBC PRESENT,BOTH PMN AND MONONUCLEAR RARE GRAM VARIABLE ROD    Culture MODERATE SERRATIA MARCESCENS  Final   Report Status 03/17/2016 FINAL  Final   Organism ID, Bacteria SERRATIA MARCESCENS  Final      Susceptibility   Serratia marcescens - MIC*    CEFAZOLIN >=64 RESISTANT Resistant     CEFEPIME <=1 SENSITIVE Sensitive     CEFTAZIDIME <=1 SENSITIVE Sensitive     CEFTRIAXONE <=  1 SENSITIVE Sensitive     CIPROFLOXACIN <=0.25 SENSITIVE Sensitive     GENTAMICIN <=1 SENSITIVE Sensitive     TRIMETH/SULFA <=20 SENSITIVE Sensitive     * MODERATE SERRATIA MARCESCENS  Culture, blood (Routine X 2) w Reflex to ID Panel     Status: None   Collection Time: 03/15/16 10:12 AM  Result Value Ref Range Status   Specimen Description BLOOD LEFT ANTECUBITAL  Final   Special Requests BOTTLES DRAWN AEROBIC ONLY 5CC  Final   Culture NO GROWTH 5 DAYS  Final   Report Status 03/20/2016 FINAL  Final   Culture, blood (Routine X 2) w Reflex to ID Panel     Status: None   Collection Time: 03/15/16 10:17 AM  Result Value Ref Range Status   Specimen Description BLOOD LEFT ANTECUBITAL  Final   Special Requests BOTTLES DRAWN AEROBIC ONLY 5CC  Final   Culture NO GROWTH 5 DAYS  Final   Report Status 03/20/2016 FINAL  Final    Anti-infectives:  Anti-infectives    Start     Dose/Rate Route Frequency Ordered Stop   03/18/16 1230  cefTRIAXone (ROCEPHIN) 2 g in dextrose 5 % 50 mL IVPB     2 g 100 mL/hr over 30 Minutes Intravenous Every 24 hours 03/18/16 1222     03/16/16 1600  vancomycin (VANCOCIN) 1,250 mg in sodium chloride 0.9 % 250 mL IVPB  Status:  Discontinued     1,250 mg 166.7 mL/hr over 90 Minutes Intravenous Every 12 hours 03/16/16 0932 03/18/16 1222   03/15/16 1600  piperacillin-tazobactam (ZOSYN) IVPB 3.375 g  Status:  Discontinued     3.375 g 12.5 mL/hr over 240 Minutes Intravenous Every 8 hours 03/15/16 1149 03/18/16 1222   03/15/16 1000  vancomycin (VANCOCIN) IVPB 1000 mg/200 mL premix  Status:  Discontinued     1,000 mg 200 mL/hr over 60 Minutes Intravenous Every 8 hours 03/15/16 0815 03/16/16 0932   03/15/16 0830  piperacillin-tazobactam (ZOSYN) IVPB 3.375 g  Status:  Discontinued     3.375 g 12.5 mL/hr over 240 Minutes Intravenous Every 8 hours 03/15/16 0758 03/15/16 1149   04-01-16 2245  ceFAZolin (ANCEF) IVPB 2g/100 mL premix  Status:  Discontinued    Comments:  Anesthesia to give preop   2 g 200 mL/hr over 30 Minutes Intravenous  Once 04/01/16 2231 04-01-2016 2235   02/28/2016 2200  ceFAZolin (ANCEF) IVPB 2g/100 mL premix     2 g 200 mL/hr over 30 Minutes Intravenous Every 8 hours 03/17/2016 1750 03/12/16 1416   03/02/2016 0800  ceFAZolin (ANCEF) IVPB 2g/100 mL premix     2 g 200 mL/hr over 30 Minutes Intravenous To [facility] 03/07/2016 0721 02/21/2016 1535      Best Practice/Protocols:  VTE Prophylaxis: Lovenox (prophylaxtic dose) and Mechanical GI Prophylaxis:  Proton Pump Inhibitor Continous Sedation Also getting Neo for BP control  Consults: Treatment Team:  [name], MD Yolonda Kida, MD [facility], MD    Events:  Subjective:    Overnight Issues: No new issues overnight.  Family conference this AM about 30 minutes, very helpful  Objective:  Vital signs for last 24 hours: Temp:  [99.4 F (37.4 C)-100.8 F (38.2 C)] 100.8 F (38.2 C) (11/06 0800) Pulse Rate:  [88-117] 101 (11/06 0830) Resp:  [13-33] 31 (11/06 0830) BP: (50-147)/(34-96) 99/60 (11/06 0830) SpO2:  [89 %-97 %] 93 % (11/06 0830) FiO2 (%):  [40 %] 40 % (11/06 0759) Weight:  [100.6 kg (221 lb  12.5 oz)] 100.6 kg (221 lb 12.5 oz) (11/06 0500)  Hemodynamic parameters for last 24 hours: CVP:  [10 mmHg-21 mmHg] 10 mmHg  Intake/Output from previous day: 11/05 0701 - 11/06 0700 In: 1575.7 [I.V.:615.7; NG/GT:960] Out: 1400 [Urine:1400]  Intake/Output this shift: No intake/output data recorded.  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 Extremities: edema 2+ and Edematous globally, but upper extrermities are better than the lower extremities..  Trying to diurese the patient  Results for orders placed or performed during the hospital encounter of 11-Aug-2015 (from the past 24 hour(s))  Glucose, capillary     Status: Abnormal   Collection Time: 03/24/16 12:05 PM  Result Value Ref  Range   Glucose-Capillary 141 (H) 65 - 99 mg/dL   Comment 1 Notify RN    Comment 2 Document in Chart   Glucose, capillary     Status: Abnormal   Collection Time: 03/24/16  3:51 PM  Result Value Ref Range   Glucose-Capillary 126 (H) 65 - 99 mg/dL  Glucose, capillary     Status: Abnormal   Collection Time: 03/24/16  8:08 PM  Result Value Ref Range   Glucose-Capillary 135 (H) 65 - 99 mg/dL  Glucose, capillary     Status: Abnormal   Collection Time: 03/25/16 12:05 AM  Result Value Ref Range   Glucose-Capillary 110 (H) 65 - 99 mg/dL  Glucose, capillary     Status: Abnormal   Collection Time: 03/25/16  3:27 AM  Result Value Ref Range   Glucose-Capillary 112 (H) 65 - 99 mg/dL  CBC     Status: Abnormal   Collection Time: 03/25/16  6:00 AM  Result Value Ref Range   WBC 9.7 4.0 - 10.5 K/uL   RBC 3.28 (L) 3.87 - 5.11 MIL/uL   Hemoglobin 9.3 (L) 12.0 - 15.0 g/dL   HCT 65.731.5 (L) 84.636.0 - 96.246.0 %   MCV 96.0 78.0 - 100.0 fL   MCH 28.4 26.0 - 34.0 pg   MCHC 29.5 (L) 30.0 - 36.0 g/dL   RDW 95.222.5 (H) 84.111.5 - 32.415.5 %   Platelets 412 (H) 150 - 400 K/uL  Glucose, capillary     Status: Abnormal   Collection Time: 03/25/16  8:28 AM  Result Value Ref Range   Glucose-Capillary 119 (H) 65 - 99 mg/dL     Assessment/Plan:   NEURO  Altered Mental Status:  agitation and sedation   Plan: Trying to find a good balance.  PULM  Atelectasis/collapse (bibasilar and also has pulmonary edema)   Plan: Try to diurese if BP will allow this  CARDIO  CHF exacerbatiion   Plan: Diuresis if able.    RENAL  Urine output and renal function are okay.   Plan: Diuresis  GI  No specific issues, tolerating tube feedings well   Plan: CPM  ID  Pneumonia (hospital acquired (not ventilator-associated) Serratia)  Plan: Has been on antibiotics for 7 days, will stop after 3 more days.  HEME  Anemia anemia of critical illness)   Plan: No need for blood transfusions currently  ENDO No specific issues   Plan: CPM  Global  Issues      LOS: 18 days   Additional comments:I reviewed the patient's new clinical lab test results. cbc/bmet (old), I reviewed the patients new imaging test results. CXR (old) and I have discussed and reviewed with family members patient's daughter, son, grandchildren--they want to move forward with trach and PEG  Critical Care Total Time*: 33 Minutes  Brentin Shin 03/25/2016  *Care during the described time interval was provided by me and/or other providers on the critical care team.  I have reviewed this patient's available data, including medical history, events of note, physical examination and test results as part of my evaluation.

## 2016-03-25 NOTE — Progress Notes (Signed)
On Call Trauma MD paged in regards to BPs and increasing blood pressure support. Orders received for 5% Albumin and Arterial Line for more accurate blood pressure monitoring. RT updated on plan. Will continue to monitor pt closely.

## 2016-03-25 NOTE — Progress Notes (Signed)
Report called to RN on 2S. Waiting for respiratory to help transport with vent. Patient's belongings packed up, foley emptied, family notified of unit/room change (Tammy). Monitoring closely.Quynh Basso, [place] ScrapeSarah E, RN

## 2016-03-25 NOTE — Progress Notes (Signed)
Pt. Was transported to 2S04 without any complications.  

## 2016-03-26 ENCOUNTER — Inpatient Hospital Stay (HOSPITAL_COMMUNITY): Payer: Medicare Other

## 2016-03-26 LAB — CBC WITH DIFFERENTIAL/PLATELET
BASOS PCT: 0 %
Basophils Absolute: 0 10*3/uL (ref 0.0–0.1)
Eosinophils Absolute: 0.5 10*3/uL (ref 0.0–0.7)
Eosinophils Relative: 5 %
HEMATOCRIT: 30.7 % — AB (ref 36.0–46.0)
HEMOGLOBIN: 9 g/dL — AB (ref 12.0–15.0)
LYMPHS PCT: 15 %
Lymphs Abs: 1.5 10*3/uL (ref 0.7–4.0)
MCH: 28.3 pg (ref 26.0–34.0)
MCHC: 29.3 g/dL — AB (ref 30.0–36.0)
MCV: 96.5 fL (ref 78.0–100.0)
MONOS PCT: 13 %
Monocytes Absolute: 1.3 10*3/uL — ABNORMAL HIGH (ref 0.1–1.0)
NEUTROS ABS: 6.7 10*3/uL (ref 1.7–7.7)
Neutrophils Relative %: 67 %
Platelets: 400 10*3/uL (ref 150–400)
RBC: 3.18 MIL/uL — ABNORMAL LOW (ref 3.87–5.11)
RDW: 21.9 % — ABNORMAL HIGH (ref 11.5–15.5)
WBC: 10 10*3/uL (ref 4.0–10.5)

## 2016-03-26 LAB — GLUCOSE, CAPILLARY
GLUCOSE-CAPILLARY: 111 mg/dL — AB (ref 65–99)
GLUCOSE-CAPILLARY: 105 mg/dL — AB (ref 65–99)
GLUCOSE-CAPILLARY: 99 mg/dL (ref 65–99)
Glucose-Capillary: 108 mg/dL — ABNORMAL HIGH (ref 65–99)
Glucose-Capillary: 109 mg/dL — ABNORMAL HIGH (ref 65–99)
Glucose-Capillary: 95 mg/dL (ref 65–99)

## 2016-03-26 LAB — BASIC METABOLIC PANEL
Anion gap: 8 (ref 5–15)
BUN: 32 mg/dL — AB (ref 6–20)
CHLORIDE: 103 mmol/L (ref 101–111)
CO2: 29 mmol/L (ref 22–32)
CREATININE: 0.46 mg/dL (ref 0.44–1.00)
Calcium: 8.1 mg/dL — ABNORMAL LOW (ref 8.9–10.3)
GFR calc Af Amer: >60 mL/min (ref >60)
GFR calc non Af Amer: >60 mL/min (ref >60)
Glucose, Bld: 111 mg/dL — ABNORMAL HIGH (ref 65–99)
Potassium: 3.7 mmol/L (ref 3.5–5.1)
Sodium: 140 mmol/L (ref 135–145)

## 2016-03-26 MED ORDER — FUROSEMIDE 10 MG/ML IJ SOLN
40.0000 mg | Freq: Once | INTRAMUSCULAR | Status: AC
  Administered 2016-03-26: 40 mg via INTRAVENOUS
  Filled 2016-03-26: qty 4

## 2016-03-26 NOTE — Progress Notes (Signed)
Advanced tube back to 21 at the lip after called to room by RN. Tube was found at 18 at the lip and advanced back to correct position at 21 at the lip.
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
## 2016-03-26 NOTE — Progress Notes (Signed)
Pt to lateral transfer to 3M12; pt's daughter called to make aware of transfer; report called to RN on 3M; will transfer with RT; will cont to monitor.  Benjamine SpragueYates, Elleni Mozingo A

## 2016-03-26 NOTE — Progress Notes (Signed)
Patient transported from 2S04 to room 3M12 without complications.

## 2016-03-27 ENCOUNTER — Inpatient Hospital Stay (HOSPITAL_COMMUNITY): Payer: Medicare Other

## 2016-03-27 ENCOUNTER — Encounter (HOSPITAL_COMMUNITY): Admission: EM | Disposition: E | Payer: Self-pay | Source: Home / Self Care

## 2016-03-27 HISTORY — PX: PEG PLACEMENT: SHX5437

## 2016-03-27 HISTORY — PX: PERCUTANEOUS TRACHEOSTOMY: SHX5288

## 2016-03-27 HISTORY — PX: ESOPHAGOGASTRODUODENOSCOPY: SHX5428

## 2016-03-27 LAB — GLUCOSE, CAPILLARY
GLUCOSE-CAPILLARY: 117 mg/dL — AB (ref 65–99)
GLUCOSE-CAPILLARY: 110 mg/dL — AB (ref 65–99)
GLUCOSE-CAPILLARY: 102 mg/dL — AB (ref 65–99)
GLUCOSE-CAPILLARY: 161 mg/dL — AB (ref 65–99)
GLUCOSE-CAPILLARY: 98 mg/dL (ref 65–99)
GLUCOSE-CAPILLARY: 101 mg/dL — AB (ref 65–99)
Glucose-Capillary: 134 mg/dL — ABNORMAL HIGH (ref 65–99)

## 2016-03-27 LAB — CBC
HCT: 32.2 % — ABNORMAL LOW (ref 36.0–46.0)
HEMOGLOBIN: 9.6 g/dL — AB (ref 12.0–15.0)
MCH: 28.4 pg (ref 26.0–34.0)
MCHC: 29.8 g/dL — AB (ref 30.0–36.0)
MCV: 95.3 fL (ref 78.0–100.0)
Platelets: 396 10*3/uL (ref 150–400)
RBC: 3.38 MIL/uL — ABNORMAL LOW (ref 3.87–5.11)
RDW: 21.7 % — AB (ref 11.5–15.5)
WBC: 11.6 10*3/uL — AB (ref 4.0–10.5)

## 2016-03-27 LAB — BASIC METABOLIC PANEL
ANION GAP: 6 (ref 5–15)
BUN: 30 mg/dL — ABNORMAL HIGH (ref 6–20)
CALCIUM: 8.3 mg/dL — AB (ref 8.9–10.3)
CHLORIDE: 101 mmol/L (ref 101–111)
CO2: 29 mmol/L (ref 22–32)
CREATININE: 0.4 mg/dL — AB (ref 0.44–1.00)
GFR calc non Af Amer: >60 mL/min (ref >60)
Glucose, Bld: 111 mg/dL — ABNORMAL HIGH (ref 65–99)
Potassium: 3.6 mmol/L (ref 3.5–5.1)
SODIUM: 136 mmol/L (ref 135–145)

## 2016-03-27 SURGERY — CREATION, TRACHEOSTOMY, PERCUTANEOUS
Anesthesia: Choice | Site: Neck

## 2016-03-27 SURGERY — EGD (ESOPHAGOGASTRODUODENOSCOPY)
Anesthesia: Moderate Sedation

## 2016-03-27 MED ORDER — MIDAZOLAM HCL 2 MG/2ML IJ SOLN
INTRAMUSCULAR | Status: AC
  Administered 2016-03-27: 11:00:00
  Filled 2016-03-27: qty 2

## 2016-03-27 MED ORDER — MIDAZOLAM HCL 2 MG/2ML IJ SOLN
2.0000 mg | Freq: Once | INTRAMUSCULAR | Status: AC
  Administered 2016-03-27: 2 mg via INTRAVENOUS

## 2016-03-27 MED ORDER — MIDAZOLAM HCL 2 MG/2ML IJ SOLN
INTRAMUSCULAR | Status: AC
  Filled 2016-03-27: qty 2

## 2016-03-27 MED ORDER — MIDAZOLAM HCL 2 MG/2ML IJ SOLN: 2.0000 mg | Freq: Once | INTRAMUSCULAR | Status: DC

## 2016-03-27 MED ORDER — FENTANYL CITRATE (PF) 100 MCG/2ML IJ SOLN
50.0000 ug | Freq: Once | INTRAMUSCULAR | Status: AC
  Administered 2016-03-27: 50 ug via INTRAVENOUS

## 2016-03-27 MED ORDER — FENTANYL CITRATE (PF) 100 MCG/2ML IJ SOLN
100.0000 ug | Freq: Once | INTRAMUSCULAR | Status: AC
  Administered 2016-03-27: 100 ug via INTRAVENOUS

## 2016-03-27 MED ORDER — 0.9 % SODIUM CHLORIDE (POUR BTL) OPTIME
TOPICAL | Status: DC | PRN
  Administered 2016-03-27: 1000 mL

## 2016-03-27 MED ORDER — VECURONIUM BROMIDE 10 MG IV SOLR
INTRAVENOUS | Status: AC
  Filled 2016-03-27: qty 10

## 2016-03-27 MED ORDER — FENTANYL CITRATE (PF) 100 MCG/2ML IJ SOLN
INTRAMUSCULAR | Status: AC
  Filled 2016-03-27: qty 4

## 2016-03-27 MED ORDER — FENTANYL CITRATE (PF) 100 MCG/2ML IJ SOLN
INTRAMUSCULAR | Status: AC
  Filled 2016-03-27: qty 2

## 2016-03-27 MED ORDER — MIDAZOLAM HCL 2 MG/2ML IJ SOLN
INTRAMUSCULAR | Status: AC
  Filled 2016-03-27: qty 4

## 2016-03-27 MED ORDER — MIDAZOLAM HCL 2 MG/2ML IJ SOLN
2.0000 mg | Freq: Once | INTRAMUSCULAR | Status: AC
  Administered 2016-03-27: 6 mg via INTRAVENOUS

## 2016-03-27 MED ORDER — LIDOCAINE-EPINEPHRINE (PF) 1.5 %-1:200000 IJ SOLN
INTRAMUSCULAR | Status: DC | PRN
  Administered 2016-03-27: 4 mL via PERINEURAL

## 2016-03-27 MED ORDER — VECURONIUM BROMIDE 10 MG IV SOLR
10.0000 mg | Freq: Once | INTRAVENOUS | Status: AC
  Administered 2016-03-27: 10 mg via INTRAVENOUS

## 2016-03-27 MED ORDER — FUROSEMIDE 10 MG/ML IJ SOLN
40.0000 mg | Freq: Once | INTRAMUSCULAR | Status: AC
Start: 2016-03-27 — End: 2016-03-27
  Administered 2016-03-27: 40 mg via INTRAVENOUS
  Filled 2016-03-27: qty 4

## 2016-03-27 SURGICAL SUPPLY — 29 items
DRAPE PROXIMA HALF (DRAPES) ×12 IMPLANT
DRAPE UTILITY XL STRL (DRAPES) ×3 IMPLANT
ELECT CAUTERY BLADE 6.4 (BLADE) ×3 IMPLANT
ELECT REM PT RETURN 9FT ADLT (ELECTROSURGICAL) ×3
ELECTRODE REM PT RTRN 9FT ADLT (ELECTROSURGICAL) ×1 IMPLANT
GAUZE SPONGE 4X4 16PLY XRAY LF (GAUZE/BANDAGES/DRESSINGS) ×3 IMPLANT
GLOVE BIO SURGEON STRL SZ 6 (GLOVE) IMPLANT
GLOVE BIO SURGEON STRL SZ8 (GLOVE) IMPLANT
GLOVE BIOGEL PI IND STRL 6.5 (GLOVE) IMPLANT
GLOVE BIOGEL PI IND STRL 8 (GLOVE) ×1 IMPLANT
GLOVE BIOGEL PI INDICATOR 6.5 (GLOVE)
GLOVE BIOGEL PI INDICATOR 8 (GLOVE) ×2
GLOVE ECLIPSE 7.5 STRL STRAW (GLOVE) ×3 IMPLANT
GOWN STRL REUS W/ TWL LRG LVL3 (GOWN DISPOSABLE) ×2 IMPLANT
GOWN STRL REUS W/ TWL XL LVL3 (GOWN DISPOSABLE) IMPLANT
GOWN STRL REUS W/TWL LRG LVL3 (GOWN DISPOSABLE) ×4
GOWN STRL REUS W/TWL XL LVL3 (GOWN DISPOSABLE)
INTRODUCER TRACH BLUE RHINO 6F (TUBING) IMPLANT
INTRODUCER TRACH BLUE RHINO 8F (TUBING) IMPLANT
NS IRRIG 1000ML POUR BTL (IV SOLUTION) ×3 IMPLANT
PENCIL BUTTON HOLSTER BLD 10FT (ELECTRODE) ×3 IMPLANT
SPONGE DRAIN TRACH 4X4 STRL 2S (GAUZE/BANDAGES/DRESSINGS) ×3 IMPLANT
SPONGE INTESTINAL PEANUT (DISPOSABLE) ×3 IMPLANT
SUT SILK 2 0 SH CR/8 (SUTURE) ×3 IMPLANT
SUT VICRYL AB 3 0 TIES (SUTURE) ×3 IMPLANT
TOWEL OR 17X26 10 PK STRL BLUE (TOWEL DISPOSABLE) ×3 IMPLANT
TUBE CONNECTING 12'X1/4 (SUCTIONS) ×1
TUBE CONNECTING 12X1/4 (SUCTIONS) ×2 IMPLANT
YANKAUER SUCT BULB TIP NO VENT (SUCTIONS) ×3 IMPLANT

## 2016-03-27 NOTE — Procedures (Signed)
Bedside Tracheostomy Insertion Procedure Note   Patient Details:   Name: Darl HouseholderBrenda A Rede DOB: 05-05-1951 MRN: [id number]  Procedure: Tracheostomy  Pre Procedure Assessment: ET Tube Size: 7.5 ET Tube secured at lip (cm): 23 Bite block in place: Yes Breath Sounds: Clear  Post Procedure Assessment: BP 91/64 (BP Location: Left Leg)   Pulse 88   Temp 99.8 F (37.7 C) (Axillary)   Resp 16   Ht 5\' 5"  (1.651 m)   Wt 220 lb 3.8 oz (99.9 kg)   SpO2 93%   BMI 36.65 kg/m  O2 sats: stable throughout Complications: No apparent complications Patient did tolerate procedure well Tracheostomy Brand:Shiley Tracheostomy Style:Cuffed Tracheostomy Size: 6.0 Tracheostomy Secured WUJ:[id number]:Sutures Tracheostomy Placement Confirmation:Trach cuff visualized and in place and Chest X ray ordered for placement    Berenice PrimasSharp, Jim Philemon S 03/21/2016, 12:39 PM
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
## 2016-03-27 NOTE — Op Note (Signed)
[facility] Patient Name: [name] Procedure Date : 04/09/2016 MRN: [id number] Attending MD: Kathrin Ruddy, MD Date of Birth: 1950/09/21 CSN: [id number] Age: 64 Admit Type: Inpatient Procedure:                Upper GI endoscopy Indications:              Place PEG because patient is unable to eat Providers:                Kathrin Ruddy, MD, Lorenda Ishihara, Technician Referring MD:              Medicines:                Fentanyl 200 micrograms IV, Midazolam 8 mg IV Complications:            No immediate complications. Estimated Blood Loss:     Estimated blood loss was minimal. Procedure:                Pre-Anesthesia Assessment:                           - Prior to the procedure, a History and Physical                            was performed, and patient medications, allergies                            and sensitivities were reviewed. The patient's                            tolerance of previous anesthesia was reviewed.                           - After reviewing the risks and benefits, the                            patient was deemed in satisfactory condition to                            undergo the procedure.                           - The anesthesia plan was to use deep                            sedation/analgesia.                           - Sedation was administered by Patient's bedside                            nurse. Deep sedation was attained.                           After obtaining informed consent, the endoscope was                            passed under  direct vision. Throughout the                            procedure, the patient's blood pressure, pulse, and                            oxygen saturations were monitored continuously. The                            [id number] ([id number]) scope was introduced through the                            mouth, and advanced to the second part of duodenum.                            The upper GI endoscopy  was accomplished without                            difficulty. The patient tolerated the procedure                            well. Scope In: Scope Out: Findings:      The proximal esophagus was normal.      The entire examined stomach was normal.      The first portion of the duodenum and second portion of the duodenum       were normal. Impression:               - Normal proximal esophagus.                           - Normal stomach.                           - Normal first portion of the duodenum and second                            portion of the duodenum.                           - No specimens collected.                           - Normal examination. Recommendation:           - Please follow the post-PEG recommendations                            including: may use PEG today for meds and water and                            start using PEG today. Procedure Code(s):        --- Professional ---                           [phone], Esophagogastroduodenoscopy, flexible,  transoral; diagnostic, including collection of                            specimen(s) by brushing or washing, when performed                            (separate procedure) Diagnosis Code(s):        --- Professional ---                           R63.3, Feeding difficulties                           Z43.1, Encounter for attention to gastrostomy CPT copyright 2016 [employer]. All rights reserved. The codes documented in this report are preliminary and upon coder review may  be revised to meet current compliance requirements. Kathrin RuddyJames Mancil Pfenning III, MD Jimmye NormanJames Anyela Napierkowski III, MD 03/20/2016 12:08:51 PM This report has been signed electronically. Number of Addenda: 0
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
## 2016-03-27 NOTE — Progress Notes (Signed)
RT note- ETT at 19cm at the lip from coughing, advanced back to 21 at charted, RN aware and assisted.

## 2016-03-28 ENCOUNTER — Encounter (HOSPITAL_COMMUNITY): Payer: Self-pay | Admitting: General Surgery

## 2016-03-28 LAB — GLUCOSE, CAPILLARY
GLUCOSE-CAPILLARY: 79 mg/dL (ref 65–99)
GLUCOSE-CAPILLARY: 98 mg/dL (ref 65–99)
Glucose-Capillary: 155 mg/dL — ABNORMAL HIGH (ref 65–99)
Glucose-Capillary: 165 mg/dL — ABNORMAL HIGH (ref 65–99)
Glucose-Capillary: 121 mg/dL — ABNORMAL HIGH (ref 65–99)
Glucose-Capillary: 127 mg/dL — ABNORMAL HIGH (ref 65–99)

## 2016-03-28 LAB — CBC WITH DIFFERENTIAL/PLATELET
BASOS ABS: 0 10*3/uL (ref 0.0–0.1)
Basophils Relative: 0 %
EOS ABS: 0.4 10*3/uL (ref 0.0–0.7)
Eosinophils Relative: 4 %
HCT: 31.2 % — ABNORMAL LOW (ref 36.0–46.0)
HEMOGLOBIN: 9.5 g/dL — AB (ref 12.0–15.0)
LYMPHS PCT: 12 %
Lymphs Abs: 1.2 10*3/uL (ref 0.7–4.0)
MCH: 29.1 pg (ref 26.0–34.0)
MCHC: 30.4 g/dL (ref 30.0–36.0)
MCV: 95.4 fL (ref 78.0–100.0)
MONO ABS: 1.2 10*3/uL — AB (ref 0.1–1.0)
Monocytes Relative: 12 %
NEUTROS ABS: 7.2 10*3/uL (ref 1.7–7.7)
Neutrophils Relative %: 72 %
PLATELETS: 367 10*3/uL (ref 150–400)
RBC: 3.27 MIL/uL — ABNORMAL LOW (ref 3.87–5.11)
RDW: 21.2 % — AB (ref 11.5–15.5)
WBC: 10 10*3/uL (ref 4.0–10.5)

## 2016-03-28 LAB — BASIC METABOLIC PANEL
ANION GAP: 4 — AB (ref 5–15)
BUN: 27 mg/dL — ABNORMAL HIGH (ref 6–20)
CALCIUM: 8 mg/dL — AB (ref 8.9–10.3)
CO2: 31 mmol/L (ref 22–32)
Chloride: 101 mmol/L (ref 101–111)
Creatinine, Ser: 0.42 mg/dL — ABNORMAL LOW (ref 0.44–1.00)
GLUCOSE: 90 mg/dL (ref 65–99)
Potassium: 3.4 mmol/L — ABNORMAL LOW (ref 3.5–5.1)
Sodium: 136 mmol/L (ref 135–145)

## 2016-03-28 MED ORDER — FUROSEMIDE 10 MG/ML IJ SOLN
40.0000 mg | Freq: Once | INTRAMUSCULAR | Status: AC
  Administered 2016-03-28: 40 mg via INTRAVENOUS
  Filled 2016-03-28: qty 4

## 2016-03-28 MED ORDER — HYDROCODONE-ACETAMINOPHEN 7.5-325 MG/15ML PO SOLN
15.0000 mL | ORAL | Status: DC | PRN
  Administered 2016-03-28 – 2016-04-03 (×7): 15 mL
  Filled 2016-03-28 (×7): qty 15
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
## 2016-03-28 NOTE — Care Management Important Message (Signed)
Important Message  Patient Details  Name: Jasmine HouseholderBrenda A Fowers MRN: [id number] Date of Birth: 1950/05/24   Medicare Important Message Given:  Other (see comment)    Matheson Vandehei [name] 03/28/2016, 9:33 AM
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
## 2016-03-28 NOTE — Progress Notes (Signed)
Attempted to wean pt on PS/CP. 14/5. Pt had increased WOB, increased RR and dropped sats to 87% in less than 5 mins. RT placed back on rest moe. Will cont to monitor and assess for wean

## 2016-03-29 ENCOUNTER — Inpatient Hospital Stay (HOSPITAL_COMMUNITY): Payer: Medicare Other

## 2016-03-29 LAB — CBC
HEMATOCRIT: 32.9 % — AB (ref 36.0–46.0)
HEMOGLOBIN: 9.7 g/dL — AB (ref 12.0–15.0)
MCH: 28.2 pg (ref 26.0–34.0)
MCHC: 29.5 g/dL — AB (ref 30.0–36.0)
MCV: 95.6 fL (ref 78.0–100.0)
Platelets: 325 10*3/uL (ref 150–400)
RBC: 3.44 MIL/uL — ABNORMAL LOW (ref 3.87–5.11)
RDW: 21 % — AB (ref 11.5–15.5)
WBC: 9.2 10*3/uL (ref 4.0–10.5)

## 2016-03-29 LAB — BASIC METABOLIC PANEL
Anion gap: 5 (ref 5–15)
BUN: 31 mg/dL — AB (ref 6–20)
CHLORIDE: 101 mmol/L (ref 101–111)
CO2: 33 mmol/L — AB (ref 22–32)
Calcium: 8.3 mg/dL — ABNORMAL LOW (ref 8.9–10.3)
Creatinine, Ser: 0.4 mg/dL — ABNORMAL LOW (ref 0.44–1.00)
GFR calc Af Amer: >60 mL/min (ref >60)
GFR calc non Af Amer: >60 mL/min (ref >60)
GLUCOSE: 97 mg/dL (ref 65–99)
POTASSIUM: 3.4 mmol/L — AB (ref 3.5–5.1)
Sodium: 139 mmol/L (ref 135–145)

## 2016-03-29 LAB — BLOOD GAS, ARTERIAL
Acid-Base Excess: 8.8 mmol/L — ABNORMAL HIGH (ref 0.0–2.0)
BICARBONATE: 33.3 mmol/L — AB (ref 20.0–28.0)
Drawn by: 244851
FIO2: 0.5
Mode: POSITIVE
O2 SAT: 96.6 %
PATIENT TEMPERATURE: 101.6
PCO2 ART: 54.6 mmHg — AB (ref 32.0–48.0)
PEEP/CPAP: 5 cmH2O
PRESSURE SUPPORT: 10 cmH2O
pH, Arterial: 7.41 (ref 7.350–7.450)
pO2, Arterial: 92 mmHg (ref 83.0–108.0)

## 2016-03-29 LAB — GLUCOSE, CAPILLARY
GLUCOSE-CAPILLARY: 73 mg/dL (ref 65–99)
Glucose-Capillary: 132 mg/dL — ABNORMAL HIGH (ref 65–99)
Glucose-Capillary: 146 mg/dL — ABNORMAL HIGH (ref 65–99)
Glucose-Capillary: 92 mg/dL (ref 65–99)
Glucose-Capillary: 152 mg/dL — ABNORMAL HIGH (ref 65–99)

## 2016-03-29 MED ORDER — ALBUMIN HUMAN 5 % IV SOLN
INTRAVENOUS | Status: AC
  Filled 2016-03-29: qty 250

## 2016-03-29 MED ORDER — ALBUMIN HUMAN 5 % IV SOLN
12.5000 g | Freq: Once | INTRAVENOUS | Status: AC
Start: 2016-03-29 — End: 2016-03-29
  Administered 2016-03-29: 12.5 g via INTRAVENOUS

## 2016-03-29 MED ORDER — IPRATROPIUM-ALBUTEROL 0.5-2.5 (3) MG/3ML IN SOLN
3.0000 mL | RESPIRATORY_TRACT | Status: DC
  Administered 2016-03-29 – 2016-04-03 (×31): 3 mL via RESPIRATORY_TRACT
  Filled 2016-03-29 (×24): qty 3
  Filled 2016-03-29: qty 39
  Filled 2016-03-29 (×6): qty 3

## 2016-03-29 NOTE — Progress Notes (Signed)
Pt s/p tracheostomy and PEG placement on 03/24/2016.   Currently remains on neo drip for soft blood pressures.   Pt weaning today some today; may need to consider LTAC hospital if pt unable to wean from vent and pressors.  Will discuss with MD.    Quintella BatonJulie W. Cleva Camero, RN, BSN  Trauma/Neuro ICU Case Manager [phone]

## 2016-03-29 NOTE — Progress Notes (Signed)
Follow up - Trauma and Critical Care  Patient Details:    [name] is an 65 y.o. female.  Lines/tubes : PICC Double Lumen 02/25/2016 PICC Right Basilic 41 cm 3 cm (Active)  Indication for Insertion or Continuance of Line Prolonged intravenous therapies 03/28/2016  8:00 PM  Exposed Catheter (cm) 3 cm 03/01/2016  9:00 AM  Site Assessment Clean;Dry;Intact 03/28/2016  8:00 PM  Lumen #1 Status Flushed;Blood return noted;Saline locked 03/28/2016  8:00 PM  Lumen #2 Status Infusing;Flushed;Blood return noted 03/28/2016  8:00 PM  Dressing Type Transparent;Occlusive 03/28/2016  8:00 PM  Dressing Status Clean;Dry;Intact;Antimicrobial disc in place 03/28/2016  8:00 PM  Line Care Connections checked and tightened 03/28/2016  8:00 PM  Line Adjustment (NICU/IV Team Only) No 03/16/2016  8:00 PM  Dressing Intervention Other (Comment) 03/24/2016  8:00 PM  Dressing Change Due 04/01/16 03/28/2016  8:00 PM     Arterial Line 03/25/16 Left Radial (Active)  Site Assessment Clean;Dry;Intact 03/28/2016  8:00 PM  Line Status Positional 03/28/2016  8:00 PM  Art Line Waveform Appropriate 03/28/2016  8:00 PM  Art Line Interventions Leveled;Zeroed and calibrated;Connections checked and tightened 03/28/2016  8:00 PM  Color/Movement/Sensation Capillary refill less than 3 sec 03/28/2016  8:00 PM  Dressing Type Transparent;Occlusive 03/28/2016  8:00 PM  Dressing Status Clean;Dry;Intact;Antimicrobial disc in place 03/28/2016  8:00 PM  Interventions Other (Comment) 03/31/2016  8:00 PM  Dressing Change Due 04/01/16 03/28/2016  8:00 PM     Gastrostomy/Enterostomy Percutaneous endoscopic gastrostomy (PEG) 24 Fr. RLQ (Active)  Surrounding Skin Dry 03/29/2016  8:00 AM  Tube Status Patent 03/29/2016  8:00 AM  Dressing Status Clean;Dry;Intact 03/29/2016  8:00 AM  Dressing Intervention Dressing reinforced 03/29/2016  8:00 AM  Dressing Type Split gauze 03/29/2016  8:00 AM     Rectal Tube/Pouch (Active)  Output (mL) 375 mL  03/29/2016  6:00 AM     Urethral Catheter Jasmine StanleyLisa W. Double-lumen 14 Fr. (Active)  Indication for Insertion or Continuance of Catheter Aggressive IV diuresis 03/29/2016  8:00 AM  Site Assessment Clean;Intact 03/29/2016  8:00 AM  Catheter Maintenance Bag below level of bladder;Catheter secured;Drainage bag/tubing not touching floor;Insertion date on drainage bag;No dependent loops;Seal intact 03/29/2016  8:00 AM  Collection Container Standard drainage bag 03/29/2016  8:00 AM  Securement Method Leg strap 03/29/2016  8:00 AM  Urinary Catheter Interventions Unclamped 03/29/2016  8:00 AM  Output (mL) 30 mL 03/29/2016  7:00 AM    Microbiology/Sepsis markers: Results for orders placed or performed during the hospital encounter of 07-23-2015  MRSA PCR Screening     Status: None   Collection Time: 03/08/16 12:31 AM  Result Value Ref Range Status   MRSA by PCR NEGATIVE NEGATIVE Final    Comment:        The GeneXpert MRSA Assay (FDA approved for NASAL specimens only), is one component of a comprehensive MRSA colonization surveillance program. It is not intended to diagnose MRSA infection nor to guide or monitor treatment for MRSA infections.   Culture, Urine     Status: None   Collection Time: 03/15/16  8:30 AM  Result Value Ref Range Status   Specimen Description URINE, CATHETERIZED  Final   Special Requests NONE  Final   Culture NO GROWTH  Final   Report Status 03/16/2016 FINAL  Final  Culture, respiratory (NON-Expectorated)     Status: None   Collection Time: 03/15/16  8:37 AM  Result Value Ref Range Status   Specimen Description TRACHEAL ASPIRATE  Final  Special Requests NONE  Final   Gram Stain   Final    RARE WBC PRESENT,BOTH PMN AND MONONUCLEAR RARE GRAM VARIABLE ROD    Culture MODERATE SERRATIA MARCESCENS  Final   Report Status 03/17/2016 FINAL  Final   Organism ID, Bacteria SERRATIA MARCESCENS  Final      Susceptibility   Serratia marcescens - MIC*    CEFAZOLIN >=64  RESISTANT Resistant     CEFEPIME <=1 SENSITIVE Sensitive     CEFTAZIDIME <=1 SENSITIVE Sensitive     CEFTRIAXONE <=1 SENSITIVE Sensitive     CIPROFLOXACIN <=0.25 SENSITIVE Sensitive     GENTAMICIN <=1 SENSITIVE Sensitive     TRIMETH/SULFA <=20 SENSITIVE Sensitive     * MODERATE SERRATIA MARCESCENS  Culture, blood (Routine X 2) w Reflex to ID Panel     Status: None   Collection Time: 03/15/16 10:12 AM  Result Value Ref Range Status   Specimen Description BLOOD LEFT ANTECUBITAL  Final   Special Requests BOTTLES DRAWN AEROBIC ONLY 5CC  Final   Culture NO GROWTH 5 DAYS  Final   Report Status 03/20/2016 FINAL  Final  Culture, blood (Routine X 2) w Reflex to ID Panel     Status: None   Collection Time: 03/15/16 10:17 AM  Result Value Ref Range Status   Specimen Description BLOOD LEFT ANTECUBITAL  Final   Special Requests BOTTLES DRAWN AEROBIC ONLY 5CC  Final   Culture NO GROWTH 5 DAYS  Final   Report Status 03/20/2016 FINAL  Final    Anti-infectives:  Anti-infectives    Start     Dose/Rate Route Frequency Ordered Stop   03/18/16 1230  cefTRIAXone (ROCEPHIN) 2 g in dextrose 5 % 50 mL IVPB     2 g 100 mL/hr over 30 Minutes Intravenous Every 24 hours 03/18/16 1222 04/04/2016 1256   03/16/16 1600  vancomycin (VANCOCIN) 1,250 mg in sodium chloride 0.9 % 250 mL IVPB  Status:  Discontinued     1,250 mg 166.7 mL/hr over 90 Minutes Intravenous Every 12 hours 03/16/16 0932 03/18/16 1222   03/15/16 1600  piperacillin-tazobactam (ZOSYN) IVPB 3.375 g  Status:  Discontinued     3.375 g 12.5 mL/hr over 240 Minutes Intravenous Every 8 hours 03/15/16 1149 03/18/16 1222   03/15/16 1000  vancomycin (VANCOCIN) IVPB 1000 mg/200 mL premix  Status:  Discontinued     1,000 mg 200 mL/hr over 60 Minutes Intravenous Every 8 hours 03/15/16 0815 03/16/16 0932   03/15/16 0830  piperacillin-tazobactam (ZOSYN) IVPB 3.375 g  Status:  Discontinued     3.375 g 12.5 mL/hr over 240 Minutes Intravenous Every 8 hours  03/15/16 0758 03/15/16 1149   12-22-15 2245  ceFAZolin (ANCEF) IVPB 2g/100 mL premix  Status:  Discontinued    Comments:  Anesthesia to give preop   2 g 200 mL/hr over 30 Minutes Intravenous  Once 12-22-15 2231 12-22-15 2235   02/29/2016 2200  ceFAZolin (ANCEF) IVPB 2g/100 mL premix     2 g 200 mL/hr over 30 Minutes Intravenous Every 8 hours 03/02/2016 1750 03/12/16 1416   03/10/2016 0800  ceFAZolin (ANCEF) IVPB 2g/100 mL premix     2 g 200 mL/hr over 30 Minutes [facility]venous To [facility] 03/18/2016 0721 02/19/2016 1535      Best Practice/Protocols:  VTE Prophylaxis: Lovenox (prophylaxtic dose) and Mechanical GI Prophylaxis: Proton Pump Inhibitor Continous Sedation  Consults: Treatment Team:  Samson FredericBrian Swinteck, MD Yolonda KidaJason Patrick Rogers, MD [facility], MD    Events:  Subjective:  Overnight Issues: Blood pressure still marginal, on Neosynephrine to maintain BP  Objective:  Vital signs for last 24 hours: Temp:  [98.4 F (36.9 C)-101.6 F (38.7 C)] 101.6 F (38.7 C) (11/10 0800) Pulse Rate:  [86-115] 100 (11/10 0846) Resp:  [16-24] 19 (11/10 0846) BP: (54-126)/(39-92) 98/83 (11/10 0846) SpO2:  [90 %-98 %] 92 % (11/10 0851) Arterial Line BP: (68-132)/(44-72) 85/54 (11/10 0846) FiO2 (%):  [40 %] 40 % (11/10 0851) Weight:  [98.4 kg (216 lb 14.9 oz)] 98.4 kg (216 lb 14.9 oz) (11/10 0444)  Hemodynamic parameters for last 24 hours:    Intake/Output from previous day: 11/09 0701 - 11/10 0700 In: 1648.2 [I.V.:648.2; NG/GT:1000] Out: 1925 [Urine:1550; Stool:375]  Intake/Output this shift: Total I/O In: 50 [I.V.:10; NG/GT:40] Out: -   Vent settings for last 24 hours: Vent Mode: PRVC FiO2 (%):  [40 %] 40 % Set Rate:  [16 bmp] 16 bmp Vt Set:  [500 mL] 500 mL PEEP:  [5 cmH20] 5 cmH20 Plateau Pressure:  [19 cmH20-28 cmH20] 24 cmH20  Physical Exam:  General: no respiratory distress and agitated Neuro: nonfocal exam and agitated Resp: wheezes bilaterally CVS:  regular rate and rhythm, S1, S2 normal, no murmur, click, rub or gallop GI: soft, nontender, BS WNL, no r/g and tolerating tube feedings well. Extremities: edema 2+  Results for orders placed or performed during the hospital encounter of 02/21/2016 (from the past 24 hour(s))  Glucose, capillary     Status: Abnormal   Collection Time: 03/28/16 12:00 PM  Result Value Ref Range   Glucose-Capillary 155 (H) 65 - 99 mg/dL  Glucose, capillary     Status: Abnormal   Collection Time: 03/28/16  3:01 PM  Result Value Ref Range   Glucose-Capillary 165 (H) 65 - 99 mg/dL   Comment 1 Notify RN    Comment 2 Document in Chart   Glucose, capillary     Status: Abnormal   Collection Time: 03/28/16  7:35 PM  Result Value Ref Range   Glucose-Capillary 121 (H) 65 - 99 mg/dL  Glucose, capillary     Status: Abnormal   Collection Time: 03/28/16 11:28 PM  Result Value Ref Range   Glucose-Capillary 127 (H) 65 - 99 mg/dL  Glucose, capillary     Status: Abnormal   Collection Time: 03/29/16  3:02 AM  Result Value Ref Range   Glucose-Capillary 132 (H) 65 - 99 mg/dL  Basic metabolic panel     Status: Abnormal   Collection Time: 03/29/16  6:05 AM  Result Value Ref Range   Sodium 139 135 - 145 mmol/L   Potassium 3.4 (L) 3.5 - 5.1 mmol/L   Chloride 101 101 - 111 mmol/L   CO2 33 (H) 22 - 32 mmol/L   Glucose, Bld 97 65 - 99 mg/dL   BUN 31 (H) 6 - 20 mg/dL   Creatinine, Ser 1.61 (L) 0.44 - 1.00 mg/dL   Calcium 8.3 (L) 8.9 - 10.3 mg/dL   GFR calc non Af Amer >60 >60 mL/min   GFR calc Af Amer >60 >60 mL/min   Anion gap 5 5 - 15  CBC     Status: Abnormal   Collection Time: 03/29/16  6:05 AM  Result Value Ref Range   WBC 9.2 4.0 - 10.5 K/uL   RBC 3.44 (L) 3.87 - 5.11 MIL/uL   Hemoglobin 9.7 (L) 12.0 - 15.0 g/dL   HCT 09.6 (L) 04.5 - 40.9 %   MCV 95.6 78.0 - 100.0 fL  MCH 28.2 26.0 - 34.0 pg   MCHC 29.5 (L) 30.0 - 36.0 g/dL   RDW 16.1 (H) 09.6 - 04.5 %   Platelets 325 150 - 400 K/uL  Glucose, capillary      Status: None   Collection Time: 03/29/16  8:05 AM  Result Value Ref Range   Glucose-Capillary 73 65 - 99 mg/dL     Assessment/Plan:   NEURO  Altered Mental Status:  agitation and sedation   Plan: Still requiring some sedation  PULM  Interstitial Lung Disease: pulmonary edema   Plan: CPM, cannot tolerate Lasix currently because of low BP and increased creatinine.  CARDIO  Sinus Tachycardia   Plan: No specific treatment  RENAL  Urine output is adequate   Plan: No Lasix for now.  GI  No specific issues, tolerating tube feedings   Plan: Continue tube feedings.  ID  No new infectious sources.   Plan: CPM.  No antibiotics and WBC is improved.  HEME  Anemia anemia of critical illness)   Plan: No specific need for blood currently  ENDO No known issues   Plan: CPM  Global Issues  Patient may be very slow to wean from the ventilator.  Her saturations are marginal.  Spiked fever to 101.6, but WBC is improved.  Check CVP, may need some colloid volume.  Check ABG     LOS: 22 days   Additional comments:I reviewed the patient's new clinical lab test results. cbc/bmet and I reviewed the patients new imaging test results. cxr  Critical Care Total Time*: 30 Minutes  Zayed Griffie 03/29/2016  *Care during the described time interval was provided by me and/or other providers on the critical care team.  I have reviewed this patient's available data, including medical history, events of note, physical examination and test results as part of my evaluation.
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
## 2016-03-30 ENCOUNTER — Inpatient Hospital Stay (HOSPITAL_COMMUNITY): Payer: Medicare Other

## 2016-03-30 DIAGNOSIS — I420 Dilated cardiomyopathy: Secondary | ICD-10-CM

## 2016-03-30 LAB — BASIC METABOLIC PANEL
Anion gap: 8 (ref 5–15)
BUN: 30 mg/dL — AB (ref 6–20)
CHLORIDE: 102 mmol/L (ref 101–111)
CO2: 30 mmol/L (ref 22–32)
Calcium: 8.4 mg/dL — ABNORMAL LOW (ref 8.9–10.3)
Creatinine, Ser: 0.43 mg/dL — ABNORMAL LOW (ref 0.44–1.00)
GFR calc Af Amer: >60 mL/min (ref >60)
GLUCOSE: 132 mg/dL — AB (ref 65–99)
POTASSIUM: 3.6 mmol/L (ref 3.5–5.1)
Sodium: 140 mmol/L (ref 135–145)

## 2016-03-30 LAB — CBC WITH DIFFERENTIAL/PLATELET
Basophils Absolute: 0 10*3/uL (ref 0.0–0.1)
Basophils Relative: 0 %
EOS PCT: 5 %
Eosinophils Absolute: 0.5 10*3/uL (ref 0.0–0.7)
HCT: 32.3 % — ABNORMAL LOW (ref 36.0–46.0)
Hemoglobin: 9.5 g/dL — ABNORMAL LOW (ref 12.0–15.0)
Lymphocytes Relative: 10 %
Lymphs Abs: 1 10*3/uL (ref 0.7–4.0)
MCH: 28.3 pg (ref 26.0–34.0)
MCHC: 29.4 g/dL — ABNORMAL LOW (ref 30.0–36.0)
MCV: 96.1 fL (ref 78.0–100.0)
Monocytes Absolute: 1.4 10*3/uL — ABNORMAL HIGH (ref 0.1–1.0)
Monocytes Relative: 14 %
Neutro Abs: 7.1 10*3/uL (ref 1.7–7.7)
Neutrophils Relative %: 71 %
PLATELETS: 351 10*3/uL (ref 150–400)
RBC: 3.36 MIL/uL — AB (ref 3.87–5.11)
RDW: 20.7 % — ABNORMAL HIGH (ref 11.5–15.5)
WBC: 10 10*3/uL (ref 4.0–10.5)

## 2016-03-30 LAB — GLUCOSE, CAPILLARY
GLUCOSE-CAPILLARY: 139 mg/dL — AB (ref 65–99)
GLUCOSE-CAPILLARY: 174 mg/dL — AB (ref 65–99)
Glucose-Capillary: 102 mg/dL — ABNORMAL HIGH (ref 65–99)
Glucose-Capillary: 104 mg/dL — ABNORMAL HIGH (ref 65–99)
Glucose-Capillary: 115 mg/dL — ABNORMAL HIGH (ref 65–99)
Glucose-Capillary: 116 mg/dL — ABNORMAL HIGH (ref 65–99)
Glucose-Capillary: 123 mg/dL — ABNORMAL HIGH (ref 65–99)

## 2016-03-30 MED ORDER — ALBUMIN HUMAN 5 % IV SOLN
25.0000 g | Freq: Once | INTRAVENOUS | Status: AC
  Administered 2016-03-30: 25 g via INTRAVENOUS
  Filled 2016-03-30: qty 250
  Filled 2016-03-30: qty 500

## 2016-03-30 MED ORDER — FUROSEMIDE 10 MG/ML IJ SOLN
40.0000 mg | Freq: Once | INTRAMUSCULAR | Status: AC
  Administered 2016-03-30: 40 mg via INTRAVENOUS
  Filled 2016-03-30: qty 4
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
## 2016-03-30 NOTE — Progress Notes (Signed)
Trauma Service Note  Subjective: Patient agitated on the ventilator.  Still on FIO2 of 50%  Objective: Vital signs in last 24 hours: Temp:  [97.9 F (36.6 C)-100.6 F (38.1 C)] 100.1 F (37.8 C) (11/11 0800) Pulse Rate:  [46-119] 110 (11/11 0715) Resp:  [17-31] 26 (11/11 0715) BP: (67-122)/(38-100) 86/68 (11/11 0715) SpO2:  [89 %-96 %] 91 % (11/11 0831) Arterial Line BP: (65-125)/(49-70) 124/68 (11/11 0415) FiO2 (%):  [40 %-50 %] 50 % (11/11 0850) Weight:  [100.6 kg (221 lb 12.5 oz)] 100.6 kg (221 lb 12.5 oz) (11/11 0437) Last BM Date: 03/30/16  Intake/Output from previous day: 11/10 0701 - 11/11 0700 In: 2002.7 [I.V.:792.7; NG/GT:960; IV Piggyback:250] Out: 1010 [Urine:960; Stool:50] Intake/Output this shift: Total I/O In: 80 [I.V.:40; NG/GT:40] Out: 115 [Urine:115]  General: Seems miserable, always grimacing.  Lungs: Wheezing bilaterally.  CXR shows pulmonary edema  Abd: Soft, tolerating tube feedings.  Extremities: No changes  Neuro: Agitated.  Lab Results: CBC   Recent Labs  03/29/16 0605 03/30/16 0500  WBC 9.2 10.0  HGB 9.7* 9.5*  HCT 32.9* 32.3*  PLT 325 351   BMET  Recent Labs  03/29/16 0605 03/30/16 0500  NA 139 140  K 3.4* 3.6  CL 101 102  CO2 33* 30  GLUCOSE 97 132*  BUN 31* 30*  CREATININE 0.40* 0.43*  CALCIUM 8.3* 8.4*   PT/INR No results for input(s): LABPROT, INR in the last 72 hours. ABG  Recent Labs  03/29/16 0950  PHART 7.410  HCO3 33.3*    Studies/Results: Dg Chest Port 1 View  Result Date: 03/30/2016 CLINICAL DATA:  Followup chest trauma.  Ventilator support. EXAM: PORTABLE CHEST 1 VIEW COMPARISON:  03/29/2016 and previous FINDINGS: Tracheostomy remains in place. Cardiomegaly persists. Diffuse pulmonary density persists, similar to yesterday. This could be a combination of edema, pneumonia and ARDS. There is volume loss in the left lower lobe. IMPRESSION: Persistent diffuse lung density consistent with edema and/or  pneumonia. Persistent left lower lobe volume loss. Electronically Signed   By: Paulina FusiMark  Shogry M.D.   On: 03/30/2016 07:43   Dg Chest Port 1 View  Result Date: 03/29/2016 CLINICAL DATA:  Respiratory failure EXAM: PORTABLE CHEST 1 VIEW COMPARISON:  03/28/2016 FINDINGS: Tracheostomy remains in good position. Right arm PICC tip in the SVC unchanged. Bibasilar airspace disease unchanged. Bilateral pleural effusions unchanged. IMPRESSION: Bilateral airspace disease unchanged. Electronically Signed   By: Marlan Palauharles  Clark M.D.   On: 03/29/2016 07:45    Anti-infectives: Anti-infectives    Start     Dose/Rate Route Frequency Ordered Stop   03/18/16 1230  cefTRIAXone (ROCEPHIN) 2 g in dextrose 5 % 50 mL IVPB     2 g 100 mL/hr over 30 Minutes Intravenous Every 24 hours 03/18/16 1222 04/12/2016 1256   03/16/16 1600  vancomycin (VANCOCIN) 1,250 mg in sodium chloride 0.9 % 250 mL IVPB  Status:  Discontinued     1,250 mg 166.7 mL/hr over 90 Minutes Intravenous Every 12 hours 03/16/16 0932 03/18/16 1222   03/15/16 1600  piperacillin-tazobactam (ZOSYN) IVPB 3.375 g  Status:  Discontinued     3.375 g 12.5 mL/hr over 240 Minutes Intravenous Every 8 hours 03/15/16 1149 03/18/16 1222   03/15/16 1000  vancomycin (VANCOCIN) IVPB 1000 mg/200 mL premix  Status:  Discontinued     1,000 mg 200 mL/hr over 60 Minutes Intravenous Every 8 hours 03/15/16 0815 03/16/16 0932   03/15/16 0830  piperacillin-tazobactam (ZOSYN) IVPB 3.375 g  Status:  Discontinued  3.375 g 12.5 mL/hr over 240 Minutes Intravenous Every 8 hours 03/15/16 0758 03/15/16 1149   02/19/2016 2245  ceFAZolin (ANCEF) IVPB 2g/100 mL premix  Status:  Discontinued    Comments:  Anesthesia to give preop   2 g 200 mL/hr over 30 Minutes Intravenous  Once 02/29/2016 2231 02/24/2016 2235   25-Jan-2016 2200  ceFAZolin (ANCEF) IVPB 2g/100 mL premix     2 g 200 mL/hr over 30 Minutes Intravenous Every 8 hours 25-Jan-2016 1750 03/12/16 1416   25-Jan-2016 0800  ceFAZolin (ANCEF)  IVPB 2g/100 mL premix     2 g 200 mL/hr over 30 Minutes Intravenous To [facility] 25-Jan-2016 0721 25-Jan-2016 1535      Assessment/Plan: s/p Procedure(s): PERCUTANEOUS TRACHEOSTOMY I believe that LTAC is a good idea and should be considered next week if the patient is eligible.  Will give albumin to hopefully allow me to give the patient lasix later today.  LOS: 23 days   Marta LamasJames O. Gae BonWyatt, III, MD, FACS [phone] Trauma Surgeon 03/30/2016

## 2016-03-31 LAB — CBC WITH DIFFERENTIAL/PLATELET
BASOS ABS: 0 10*3/uL (ref 0.0–0.1)
BASOS PCT: 0 %
EOS ABS: 0.4 10*3/uL (ref 0.0–0.7)
EOS PCT: 4 %
HCT: 29.9 % — ABNORMAL LOW (ref 36.0–46.0)
Hemoglobin: 9.1 g/dL — ABNORMAL LOW (ref 12.0–15.0)
Lymphocytes Relative: 14 %
Lymphs Abs: 1.3 10*3/uL (ref 0.7–4.0)
MCH: 29.3 pg (ref 26.0–34.0)
MCHC: 30.4 g/dL (ref 30.0–36.0)
MCV: 96.1 fL (ref 78.0–100.0)
MONO ABS: 0.9 10*3/uL (ref 0.1–1.0)
MONOS PCT: 11 %
NEUTROS ABS: 6.2 10*3/uL (ref 1.7–7.7)
Neutrophils Relative %: 71 %
PLATELETS: 313 10*3/uL (ref 150–400)
RBC: 3.11 MIL/uL — ABNORMAL LOW (ref 3.87–5.11)
RDW: 21 % — AB (ref 11.5–15.5)
WBC: 8.8 10*3/uL (ref 4.0–10.5)

## 2016-03-31 LAB — GLUCOSE, CAPILLARY
GLUCOSE-CAPILLARY: 106 mg/dL — AB (ref 65–99)
GLUCOSE-CAPILLARY: 167 mg/dL — AB (ref 65–99)
GLUCOSE-CAPILLARY: 158 mg/dL — AB (ref 65–99)
Glucose-Capillary: 153 mg/dL — ABNORMAL HIGH (ref 65–99)
Glucose-Capillary: 160 mg/dL — ABNORMAL HIGH (ref 65–99)
Glucose-Capillary: 105 mg/dL — ABNORMAL HIGH (ref 65–99)

## 2016-03-31 LAB — BLOOD GAS, ARTERIAL
ACID-BASE EXCESS: 10 mmol/L — AB (ref 0.0–2.0)
Acid-Base Excess: 9.3 mmol/L — ABNORMAL HIGH (ref 0.0–2.0)
BICARBONATE: 33.7 mmol/L — AB (ref 20.0–28.0)
BICARBONATE: 34.2 mmol/L — AB (ref 20.0–28.0)
DRAWN BY: 418751
Drawn by: 295031
FIO2: 50
FIO2: 40
LHR: 16 {breaths}/min
LHR: 16 {breaths}/min
MECHVT: 500 mL
O2 Saturation: 98.3 %
O2 Saturation: 90.6 %
PEEP: 5 cmH2O
PEEP: 5 cmH2O
PO2 ART: 108 mmHg (ref 83.0–108.0)
Patient temperature: 98.6
Patient temperature: 98.6
VT: 500 mL
pCO2 arterial: 48.8 mmHg — ABNORMAL HIGH (ref 32.0–48.0)
pCO2 arterial: 48.3 mmHg — ABNORMAL HIGH (ref 32.0–48.0)
pH, Arterial: 7.453 — ABNORMAL HIGH (ref 7.350–7.450)
pH, Arterial: 7.464 — ABNORMAL HIGH (ref 7.350–7.450)
pO2, Arterial: 60.3 mmHg — ABNORMAL LOW (ref 83.0–108.0)

## 2016-03-31 LAB — BASIC METABOLIC PANEL
ANION GAP: 7 (ref 5–15)
BUN: 33 mg/dL — ABNORMAL HIGH (ref 6–20)
CALCIUM: 8.7 mg/dL — AB (ref 8.9–10.3)
CO2: 32 mmol/L (ref 22–32)
CREATININE: 0.46 mg/dL (ref 0.44–1.00)
Chloride: 102 mmol/L (ref 101–111)
Glucose, Bld: 175 mg/dL — ABNORMAL HIGH (ref 65–99)
Potassium: 3.2 mmol/L — ABNORMAL LOW (ref 3.5–5.1)
Sodium: 141 mmol/L (ref 135–145)

## 2016-03-31 NOTE — Progress Notes (Signed)
Trauma Service Note  Subjective: Patient still not able to wean on the ventilator.  LTAC placement is reasonable.  Objective: Vital signs in last 24 hours: Temp:  [98.9 F (37.2 C)-100.9 F (38.3 C)] 99.7 F (37.6 C) (11/12 0759) Pulse Rate:  [45-115] 102 (11/12 0800) Resp:  [18-26] 21 (11/12 0800) BP: (92-132)/(49-104) 125/75 (11/12 0800) SpO2:  [93 %-100 %] 96 % (11/12 0830) FiO2 (%):  [40 %-50 %] 40 % (11/12 0830) Weight:  [102 kg (224 lb 13.9 oz)] 102 kg (224 lb 13.9 oz) (11/12 0343) Last BM Date: 03/30/16  Intake/Output from previous day: 11/11 0701 - 11/12 0700 In: 1383.3 [I.V.:383.3; NG/GT:1000] Out: 1910 [Urine:1710; Stool:200] Intake/Output this shift: Total I/O In: 50 [I.V.:10; NG/GT:40] Out: -   General: Seems to be uncomfortable all lthe time.  Lungs: Wheezing all the time on both sides.  ABG pending.  Abd: soft, good bowel sounds.  Extremities: No changes.  Lets her lleft leg fall off the bed all the time, seems to be her preference  Neuro: Agitated and disoriented  Lab Results: CBC   Recent Labs  03/30/16 0500 03/31/16 0545  WBC 10.0 8.8  HGB 9.5* 9.1*  HCT 32.3* 29.9*  PLT 351 313   BMET  Recent Labs  03/30/16 0500 03/31/16 0545  NA 140 141  K 3.6 3.2*  CL 102 102  CO2 30 32  GLUCOSE 132* 175*  BUN 30* 33*  CREATININE 0.43* 0.46  CALCIUM 8.4* 8.7*   PT/INR No results for input(s): LABPROT, INR in the last 72 hours. ABG  Recent Labs  03/29/16 0950 03/31/16 0356  PHART 7.410 7.453*  HCO3 33.3* 33.7*    Studies/Results: Dg Chest Port 1 View  Result Date: 03/30/2016 CLINICAL DATA:  Followup chest trauma.  Ventilator support. EXAM: PORTABLE CHEST 1 VIEW COMPARISON:  03/29/2016 and previous FINDINGS: Tracheostomy remains in place. Cardiomegaly persists. Diffuse pulmonary density persists, similar to yesterday. This could be a combination of edema, pneumonia and ARDS. There is volume loss in the left lower lobe. IMPRESSION:  Persistent diffuse lung density consistent with edema and/or pneumonia. Persistent left lower lobe volume loss. Electronically Signed   By: Paulina FusiMark  Shogry M.D.   On: 03/30/2016 07:43    Anti-infectives: Anti-infectives    Start     Dose/Rate Route Frequency Ordered Stop   03/18/16 1230  cefTRIAXone (ROCEPHIN) 2 g in dextrose 5 % 50 mL IVPB     2 g 100 mL/hr over 30 Minutes Intravenous Every 24 hours 03/18/16 1222 12-Sep-2015 1256   03/16/16 1600  vancomycin (VANCOCIN) 1,250 mg in sodium chloride 0.9 % 250 mL IVPB  Status:  Discontinued     1,250 mg 166.7 mL/hr over 90 Minutes Intravenous Every 12 hours 03/16/16 0932 03/18/16 1222   03/15/16 1600  piperacillin-tazobactam (ZOSYN) IVPB 3.375 g  Status:  Discontinued     3.375 g 12.5 mL/hr over 240 Minutes Intravenous Every 8 hours 03/15/16 1149 03/18/16 1222   03/15/16 1000  vancomycin (VANCOCIN) IVPB 1000 mg/200 mL premix  Status:  Discontinued     1,000 mg 200 mL/hr over 60 Minutes Intravenous Every 8 hours 03/15/16 0815 03/16/16 0932   03/15/16 0830  piperacillin-tazobactam (ZOSYN) IVPB 3.375 g  Status:  Discontinued     3.375 g 12.5 mL/hr over 240 Minutes Intravenous Every 8 hours 03/15/16 0758 03/15/16 1149   03/12/2016 2245  ceFAZolin (ANCEF) IVPB 2g/100 mL premix  Status:  Discontinued    Comments:  Anesthesia to give preop  2 g 200 mL/hr over 30 Minutes Intravenous  Once 03/10/2016 2231 03/17/2016 2235   03/04/2016 2200  ceFAZolin (ANCEF) IVPB 2g/100 mL premix     2 g 200 mL/hr over 30 Minutes Intravenous Every 8 hours 02/28/2016 1750 03/12/16 1416   02/29/2016 0800  ceFAZolin (ANCEF) IVPB 2g/100 mL premix     2 g 200 mL/hr over 30 Minutes [facility] 03/13/2016 0721 03/01/2016 1535      Assessment/Plan: s/p Procedure(s): PERCUTANEOUS TRACHEOSTOMY ABG  Consider LTAC  LOS: 24 days   Marta LamasJames O. Gae BonWyatt, III, MD, FACS [phone] Trauma Surgeon 03/31/2016

## 2016-04-01 LAB — CBC
HCT: 32 % — ABNORMAL LOW (ref 36.0–46.0)
Hemoglobin: 9.3 g/dL — ABNORMAL LOW (ref 12.0–15.0)
MCH: 27.8 pg (ref 26.0–34.0)
MCHC: 29.1 g/dL — ABNORMAL LOW (ref 30.0–36.0)
MCV: 95.8 fL (ref 78.0–100.0)
PLATELETS: 306 10*3/uL (ref 150–400)
RBC: 3.34 MIL/uL — ABNORMAL LOW (ref 3.87–5.11)
RDW: 20.6 % — AB (ref 11.5–15.5)
WBC: 9.6 10*3/uL (ref 4.0–10.5)

## 2016-04-01 LAB — GLUCOSE, CAPILLARY
GLUCOSE-CAPILLARY: 74 mg/dL (ref 65–99)
Glucose-Capillary: 87 mg/dL (ref 65–99)
Glucose-Capillary: 161 mg/dL — ABNORMAL HIGH (ref 65–99)
Glucose-Capillary: 79 mg/dL (ref 65–99)
Glucose-Capillary: 138 mg/dL — ABNORMAL HIGH (ref 65–99)

## 2016-04-01 MED ORDER — COLLAGENASE 250 UNIT/GM EX OINT
TOPICAL_OINTMENT | Freq: Every day | CUTANEOUS | Status: DC
  Administered 2016-04-01 – 2016-04-02 (×2): via TOPICAL
  Administered 2016-04-03: 1 via TOPICAL
  Filled 2016-04-01: qty 30

## 2016-04-01 MED ORDER — FUROSEMIDE 10 MG/ML IJ SOLN
40.0000 mg | Freq: Every day | INTRAMUSCULAR | Status: DC
  Administered 2016-04-01 – 2016-04-03 (×3): 40 mg via INTRAVENOUS
  Filled 2016-04-01 (×3): qty 4

## 2016-04-01 MED ORDER — POTASSIUM CHLORIDE 20 MEQ/15ML (10%) PO SOLN
40.0000 meq | Freq: Two times a day (BID) | ORAL | Status: DC
  Administered 2016-04-01 – 2016-04-03 (×5): 40 meq via ORAL
  Filled 2016-04-01 (×5): qty 30
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
## 2016-04-01 NOTE — Care Management Important Message (Signed)
Important Message  Patient Details  Name: [name] MRN: [id number] Date of Birth: August 19, 1950   Medicare Important Message Given:  Other (see comment)    [name] 04/01/2016, 10:35 AM

## 2016-04-01 NOTE — Progress Notes (Signed)
125mL fentanyl gtt wasted in sink with Thomes CakeMari Furr RN.
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
## 2016-04-02 LAB — BASIC METABOLIC PANEL
Anion gap: 7 (ref 5–15)
BUN: 41 mg/dL — AB (ref 6–20)
CALCIUM: 8.6 mg/dL — AB (ref 8.9–10.3)
CO2: 31 mmol/L (ref 22–32)
CREATININE: 0.49 mg/dL (ref 0.44–1.00)
Chloride: 108 mmol/L (ref 101–111)
GFR calc Af Amer: >60 mL/min (ref >60)
Glucose, Bld: 98 mg/dL (ref 65–99)
POTASSIUM: 4.2 mmol/L (ref 3.5–5.1)
SODIUM: 146 mmol/L — AB (ref 135–145)

## 2016-04-02 LAB — GLUCOSE, CAPILLARY
GLUCOSE-CAPILLARY: 100 mg/dL — AB (ref 65–99)
GLUCOSE-CAPILLARY: 109 mg/dL — AB (ref 65–99)
GLUCOSE-CAPILLARY: 127 mg/dL — AB (ref 65–99)
GLUCOSE-CAPILLARY: 139 mg/dL — AB (ref 65–99)
GLUCOSE-CAPILLARY: 177 mg/dL — AB (ref 65–99)
Glucose-Capillary: 121 mg/dL — ABNORMAL HIGH (ref 65–99)
Glucose-Capillary: 140 mg/dL — ABNORMAL HIGH (ref 65–99)

## 2016-04-02 MED ORDER — CLONAZEPAM 0.125 MG PO TBDP
0.2500 mg | ORAL_TABLET | Freq: Two times a day (BID) | ORAL | Status: DC
Start: 2016-04-02 — End: 2016-04-03
  Administered 2016-04-02 – 2016-04-03 (×3): 0.25 mg
  Filled 2016-04-02 (×3): qty 2

## 2016-04-02 NOTE — Plan of Care (Signed)
Problem: Skin Integrity: Goal: Risk for impaired skin integrity will decrease Outcome: Not Progressing Patient has unstageable pressure wound on Left shoulder under C-collar. Per wound nurse, apply Santyl and perform dressing changes daily.
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
## 2016-04-02 NOTE — Progress Notes (Signed)
Spoke with Roswell Minersammy Whitehead, patient's daughter, by phone at her request.  Daughter states family is fairly certain that they are wishing to withdraw aggressive measures, and [facility].  They do wish to speak to Dr. Lindie SpruceWyatt in the morning before making their final decision.  Daughter states they have realized the last few days that pt is suffering, and none of the family wants this.  I offered emotional support to daughter; stated that our team respects their wishes, and will support them however we can.   She was appreciative of my call.   Will follow up in AM.   Quintella BatonJulie W. Winna Golla, RN, BSN  Trauma/Neuro ICU Case Manager [phone]
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
## 2016-04-03 LAB — BASIC METABOLIC PANEL
ANION GAP: 6 (ref 5–15)
BUN: 43 mg/dL — AB (ref 6–20)
CHLORIDE: 109 mmol/L (ref 101–111)
CO2: 31 mmol/L (ref 22–32)
Calcium: 8.7 mg/dL — ABNORMAL LOW (ref 8.9–10.3)
Creatinine, Ser: 0.51 mg/dL (ref 0.44–1.00)
Glucose, Bld: 102 mg/dL — ABNORMAL HIGH (ref 65–99)
POTASSIUM: 4.7 mmol/L (ref 3.5–5.1)
SODIUM: 146 mmol/L — AB (ref 135–145)

## 2016-04-03 LAB — GLUCOSE, CAPILLARY
GLUCOSE-CAPILLARY: 112 mg/dL — AB (ref 65–99)
GLUCOSE-CAPILLARY: 161 mg/dL — AB (ref 65–99)

## 2016-04-03 MED ORDER — MORPHINE BOLUS VIA INFUSION
5.0000 mg | INTRAVENOUS | Status: DC | PRN
  Filled 2016-04-03: qty 20

## 2016-04-03 MED ORDER — ATROPINE SULFATE 1 % OP SOLN
2.0000 [drp] | OPHTHALMIC | Status: DC | PRN
  Administered 2016-04-03: 2 [drp] via SUBLINGUAL
  Filled 2016-04-03: qty 2

## 2016-04-03 MED ORDER — CHLORHEXIDINE GLUCONATE 0.12 % MT SOLN
OROMUCOSAL | Status: AC
  Filled 2016-04-03: qty 15

## 2016-04-03 MED ORDER — MORPHINE SULFATE 25 MG/ML IV SOLN
10.0000 mg/h | INTRAVENOUS | Status: DC
  Administered 2016-04-03: 10 mg/h via INTRAVENOUS
  Filled 2016-04-03: qty 10

## 2016-04-19 NOTE — Progress Notes (Signed)
The family has decided that they would like comfort measures.  This orders have been placed.  She has not changed much physiologically.  Marta LamasJames O. Gae BonWyatt, III, MD, FACS [phone] Trauma Surgeon

## 2016-04-19 NOTE — Progress Notes (Signed)
RT note-ventilator support removed per MD, family request for comfort care. Aerosol trach collar placed at 28%.

## 2016-04-19 NOTE — Progress Notes (Signed)
Provided emotional/spiritual support and prayer to various family members -- and information to night chaplain Casimiro NeedleMichael.   03/27/2016 1700  Clinical Encounter Type  Visited With Patient and family together  Visit Type Follow-up;Psychological support;Spiritual support;Social support  Spiritual Encounters  Spiritual Needs Grief support  Stress Factors  Patient Stress Factors Other (Comment) (end of life)  Family Stress Factors Family relationships;Loss;Other (Comment) (end of life)
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
## 2016-04-19 NOTE — Progress Notes (Signed)
Received order to terminally remove ventilator and apply trach collar to pt. Morphine was started and pt was extubated at 1547. Comfort measures were provided to the patient and family. RN continued to monitor patient. At 1803 pt vomited a small amount. While suctioning the pt's mouth, pt was noted to be apneic. Pulses were not palpable. EKG showed asystole at 1807 and 2 RNs independently auscultated for heart and lung sounds, which were determined to be absent. Time of death was declared at [id number]. Dr. Lindie SpruceWyatt of Trauma Services was notified and acknowledged TOD. [name] (medical examiner) was notified and pt was deemed a medical examiner case. CDS was notified and pt is a candidate for bone and tissue donation. Post-mortem checklist completed.

## 2016-04-19 NOTE — Progress Notes (Signed)
Visited with some of pt's family in rm: pt's twin sister, daughter and her husband, and grand-daughter. In huddle nurse said the family were planning to remove tubes and begin comfort care today. Nurse noted pt's grandson did not seem in consensus with other family members and had left rm. Provided spiritual/emotional care  to four family members in rm, and prayer with pt's twin sister. Latter said she did not know what her sister would have wanted, even though they were so close, there was no other relationship like it. She wished now they'd talked about it, but they never had -- who thought this would happen? But it was so hard to see her like this -- which she'd been since brought in mid-October. Chaplain remains available for follow-up.   04/08/2016 1100  Clinical Encounter Type  Visited With Patient and family together  Visit Type Follow-up;Psychological support;Other (Comment) (grief support)  Referral From Chaplain;Nurse  Spiritual Encounters  Spiritual Needs Prayer;Emotional;Grief support  Stress Factors  Patient Stress Factors Health changes;Lack of knowledge;Loss of control;Major life changes  Family Stress Factors Exhausted;Family relationships;Health changes;Loss;Loss of control

## 2016-04-19 NOTE — Progress Notes (Signed)
Family has chosen to offer comfort measures after discussions with MD.  Will continue to follow/offer support to family/staff as appropriate.    Quintella BatonJulie W. Izmael Duross, RN, BSN  Trauma/Neuro ICU Case Manager [phone]

## 2016-04-19 NOTE — Progress Notes (Signed)
Responded to end of life consult. Visited with same family in rm as before, + pt's niece and her husband. Pt's twin sister showed me many family photos on her phone.  Pt's daughter (who had been on phone when I was there before) said they had a good morning w/ pt, and her mom would not like to be like this.    04/09/2016 1400  Clinical Encounter Type  Visited With Patient and family together  Visit Type Follow-up;Psychological support;Spiritual support;Social support;Other (Comment)  Referral From Nurse (end of life)  Spiritual Encounters  Spiritual Needs Emotional;Grief support  Stress Factors  Patient Stress Factors Lack of knowledge;Loss of control  Family Stress Factors Family relationships;Health changes;Loss;Loss of control

## 2016-04-19 DEATH — deceased

## 2016-04-29 ENCOUNTER — Ambulatory Visit: Payer: Medicare Other | Admitting: Cardiovascular Disease
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
